# Patient Record
Sex: Male | Born: 1950
Health system: Southern US, Community
[De-identification: ages and names within clinical notes are randomized; demographics above are authoritative.]

## PROBLEM LIST (undated history)

## (undated) ENCOUNTER — Encounter

## (undated) DIAGNOSIS — J449 Chronic obstructive pulmonary disease, unspecified: Secondary | ICD-10-CM

## (undated) DIAGNOSIS — R079 Chest pain, unspecified: Secondary | ICD-10-CM

## (undated) DIAGNOSIS — E119 Type 2 diabetes mellitus without complications: Secondary | ICD-10-CM

## (undated) DIAGNOSIS — I739 Peripheral vascular disease, unspecified: Secondary | ICD-10-CM

## (undated) DIAGNOSIS — K219 Gastro-esophageal reflux disease without esophagitis: Secondary | ICD-10-CM

## (undated) DIAGNOSIS — M549 Dorsalgia, unspecified: Secondary | ICD-10-CM

## (undated) DIAGNOSIS — E785 Hyperlipidemia, unspecified: Secondary | ICD-10-CM

## (undated) DIAGNOSIS — J189 Pneumonia, unspecified organism: Secondary | ICD-10-CM

## (undated) DIAGNOSIS — D649 Anemia, unspecified: Secondary | ICD-10-CM

## (undated) DIAGNOSIS — I10 Essential (primary) hypertension: Secondary | ICD-10-CM

## (undated) DIAGNOSIS — M1990 Unspecified osteoarthritis, unspecified site: Secondary | ICD-10-CM

## (undated) DIAGNOSIS — G8929 Other chronic pain: Secondary | ICD-10-CM

## (undated) DIAGNOSIS — I2510 Atherosclerotic heart disease of native coronary artery without angina pectoris: Secondary | ICD-10-CM

## (undated) DIAGNOSIS — N529 Male erectile dysfunction, unspecified: Secondary | ICD-10-CM

## (undated) HISTORY — DX: Type 2 diabetes mellitus without complications: E11.9

## (undated) HISTORY — DX: Male erectile dysfunction, unspecified: N52.9

## (undated) HISTORY — DX: Atherosclerotic heart disease of native coronary artery without angina pectoris: I25.10

## (undated) HISTORY — PX: KNEE SURGERY: SHX244

## (undated) HISTORY — DX: Chest pain, unspecified: R07.9

## (undated) HISTORY — PX: COLONOSCOPY: SHX174

## (undated) HISTORY — DX: Hyperlipidemia, unspecified: E78.5

---

## 2012-10-14 ENCOUNTER — Encounter (HOSPITAL_COMMUNITY): Payer: Self-pay | Admitting: Emergency Medicine

## 2012-10-14 ENCOUNTER — Emergency Department (HOSPITAL_COMMUNITY)
Admission: EM | Admit: 2012-10-14 | Discharge: 2012-10-15 | Disposition: A | Discharge status: Other Acute Inpatient Hospital | Payer: Medicaid Other | Attending: Emergency Medicine | Admitting: Emergency Medicine

## 2012-10-14 DIAGNOSIS — F329 Major depressive disorder, single episode, unspecified: Secondary | ICD-10-CM

## 2012-10-14 DIAGNOSIS — F1010 Alcohol abuse, uncomplicated: Secondary | ICD-10-CM | POA: Insufficient documentation

## 2012-10-14 DIAGNOSIS — F3289 Other specified depressive episodes: Secondary | ICD-10-CM | POA: Insufficient documentation

## 2012-10-14 DIAGNOSIS — R110 Nausea: Secondary | ICD-10-CM | POA: Insufficient documentation

## 2012-10-14 DIAGNOSIS — F39 Unspecified mood [affective] disorder: Secondary | ICD-10-CM

## 2012-10-14 DIAGNOSIS — Z79899 Other long term (current) drug therapy: Secondary | ICD-10-CM | POA: Insufficient documentation

## 2012-10-14 DIAGNOSIS — F1994 Other psychoactive substance use, unspecified with psychoactive substance-induced mood disorder: Secondary | ICD-10-CM

## 2012-10-14 DIAGNOSIS — F1410 Cocaine abuse, uncomplicated: Secondary | ICD-10-CM | POA: Insufficient documentation

## 2012-10-14 DIAGNOSIS — Z0289 Encounter for other administrative examinations: Secondary | ICD-10-CM | POA: Insufficient documentation

## 2012-10-14 DIAGNOSIS — G8929 Other chronic pain: Secondary | ICD-10-CM | POA: Insufficient documentation

## 2012-10-14 DIAGNOSIS — K219 Gastro-esophageal reflux disease without esophagitis: Secondary | ICD-10-CM | POA: Insufficient documentation

## 2012-10-14 DIAGNOSIS — F1910 Other psychoactive substance abuse, uncomplicated: Secondary | ICD-10-CM | POA: Insufficient documentation

## 2012-10-14 DIAGNOSIS — R1013 Epigastric pain: Secondary | ICD-10-CM | POA: Insufficient documentation

## 2012-10-14 DIAGNOSIS — Z765 Malingerer [conscious simulation]: Secondary | ICD-10-CM

## 2012-10-14 DIAGNOSIS — M549 Dorsalgia, unspecified: Secondary | ICD-10-CM | POA: Insufficient documentation

## 2012-10-14 DIAGNOSIS — R45851 Suicidal ideations: Secondary | ICD-10-CM | POA: Insufficient documentation

## 2012-10-14 HISTORY — DX: Dorsalgia, unspecified: M54.9

## 2012-10-14 HISTORY — DX: Other chronic pain: G89.29

## 2012-10-14 HISTORY — DX: Gastro-esophageal reflux disease without esophagitis: K21.9

## 2012-10-14 LAB — CBC
HCT: 46.4 % (ref 39.0–52.0)
MCV: 82.7 fL (ref 78.0–100.0)
Platelets: 277 10*3/uL (ref 150–400)
RBC: 5.61 MIL/uL (ref 4.22–5.81)
WBC: 11.6 10*3/uL — ABNORMAL HIGH (ref 4.0–10.5)

## 2012-10-14 LAB — COMPREHENSIVE METABOLIC PANEL
AST: 28 U/L (ref 0–37)
Alkaline Phosphatase: 145 U/L — ABNORMAL HIGH (ref 39–117)
BUN: 11 mg/dL (ref 6–23)
CO2: 27 mEq/L (ref 19–32)
Chloride: 99 mEq/L (ref 96–112)
Creatinine, Ser: 0.98 mg/dL (ref 0.50–1.35)
GFR calc non Af Amer: 88 mL/min — ABNORMAL LOW (ref >90)
Potassium: 3.5 mEq/L (ref 3.5–5.1)
Total Bilirubin: 1.3 mg/dL — ABNORMAL HIGH (ref 0.3–1.2)

## 2012-10-14 LAB — RAPID URINE DRUG SCREEN, HOSP PERFORMED
Amphetamines: NOT DETECTED
Opiates: NOT DETECTED
Tetrahydrocannabinol: POSITIVE — AB

## 2012-10-14 LAB — TROPONIN I: Troponin I: <0.3 ng/mL (ref <0.30)

## 2012-10-14 MED ORDER — NICOTINE 21 MG/24HR TD PT24
21.0000 mg | MEDICATED_PATCH | Freq: Every day | TRANSDERMAL | Status: DC
  Administered 2012-10-14 – 2012-10-15 (×2): 21 mg via TRANSDERMAL
  Filled 2012-10-14 (×2): qty 1

## 2012-10-14 MED ORDER — ACETAMINOPHEN 325 MG PO TABS: 650.0000 mg | ORAL_TABLET | ORAL | Status: DC | PRN

## 2012-10-14 MED ORDER — ZOLPIDEM TARTRATE 5 MG PO TABS: 5.0000 mg | ORAL_TABLET | Freq: Every evening | ORAL | Status: DC | PRN

## 2012-10-14 MED ORDER — LORAZEPAM 1 MG PO TABS: 1.0000 mg | ORAL_TABLET | Freq: Four times a day (QID) | ORAL | Status: DC | PRN

## 2012-10-14 MED ORDER — ONDANSETRON HCL 4 MG PO TABS: 4.0000 mg | ORAL_TABLET | Freq: Three times a day (TID) | ORAL | Status: DC | PRN

## 2012-10-14 MED ORDER — IBUPROFEN 200 MG PO TABS: 600.0000 mg | ORAL_TABLET | Freq: Three times a day (TID) | ORAL | Status: DC | PRN

## 2012-10-14 MED ORDER — VITAMIN B-1 100 MG PO TABS
100.0000 mg | ORAL_TABLET | Freq: Every day | ORAL | Status: DC
  Administered 2012-10-14 – 2012-10-15 (×2): 100 mg via ORAL
  Filled 2012-10-14 (×2): qty 1

## 2012-10-14 MED ORDER — LORAZEPAM 2 MG/ML IJ SOLN: 1.0000 mg | Freq: Four times a day (QID) | INTRAMUSCULAR | Status: DC | PRN

## 2012-10-14 MED ORDER — ADULT MULTIVITAMIN W/MINERALS CH
1.0000 | ORAL_TABLET | Freq: Every day | ORAL | Status: DC
  Administered 2012-10-14 – 2012-10-15 (×2): 1 via ORAL
  Filled 2012-10-14 (×2): qty 1

## 2012-10-14 MED ORDER — THIAMINE HCL 100 MG/ML IJ SOLN: 100.0000 mg | Freq: Every day | INTRAMUSCULAR | Status: DC

## 2012-10-14 MED ORDER — TRAMADOL HCL 50 MG PO TABS: 50.0000 mg | ORAL_TABLET | Freq: Four times a day (QID) | ORAL | Status: DC | PRN

## 2012-10-14 MED ORDER — PANTOPRAZOLE SODIUM 40 MG PO TBEC
40.0000 mg | DELAYED_RELEASE_TABLET | Freq: Every day | ORAL | Status: DC
  Administered 2012-10-14 – 2012-10-15 (×2): 40 mg via ORAL
  Filled 2012-10-14 (×2): qty 1

## 2012-10-14 MED ORDER — LORAZEPAM 1 MG PO TABS: 0.0000 mg | ORAL_TABLET | Freq: Two times a day (BID) | ORAL | Status: DC

## 2012-10-14 MED ORDER — LORAZEPAM 1 MG PO TABS
0.0000 mg | ORAL_TABLET | Freq: Four times a day (QID) | ORAL | Status: DC
  Administered 2012-10-14: 2 mg via ORAL
  Filled 2012-10-14: qty 2

## 2012-10-14 MED ORDER — GI COCKTAIL ~~LOC~~
30.0000 mL | Freq: Once | ORAL | Status: AC
  Administered 2012-10-14: 30 mL via ORAL
  Filled 2012-10-14: qty 30

## 2012-10-14 MED ORDER — FOLIC ACID 1 MG PO TABS
1.0000 mg | ORAL_TABLET | Freq: Every day | ORAL | Status: DC
  Administered 2012-10-14 – 2012-10-15 (×2): 1 mg via ORAL
  Filled 2012-10-14 (×2): qty 1

## 2012-10-14 MED ORDER — SIMVASTATIN 10 MG PO TABS
10.0000 mg | ORAL_TABLET | Freq: Every day | ORAL | Status: DC
  Administered 2012-10-14: 10 mg via ORAL
  Filled 2012-10-14 (×2): qty 1

## 2012-10-14 NOTE — Consult Note (Signed)
Andre Rojas   Reason for Rojas:  Eval for IP alcohol Detoc Referring Physician:  Radford Pax MD Andre Rojas is an 62 y.o. male.  Assessment: AXIS I:  Mood Disorder NOS and Substance Abuse AXIS II:  No diagnosis AXIS III:   Past Medical History  Diagnosis Date  . Acid reflux   . Chronic back pain    AXIS IV:  housing problems AXIS V:  21-30 behavior considerably influenced by delusions or hallucinations OR serious impairment in judgment, communication OR inability to function in almost all areas  Plan:  Recommend psychiatric Inpatient admission when medically cleared.  Subjective:   Andre Rojas is a 62 y.o. male patient  Whom is homeless is presenting voluntarily requesting alcohol detox in addition to exacerbation of MDD. Patient endorses SI with plan to take an O/D of medications as well as non command auditory hallucinations. Patient denies HI and cannot contract for safety at this time. Patient states he did $ 400 worth of crack yesterday and usually consumes  2- 3 40 oz beers daily or a 5 th of liquor.  HPI:  HPI Elements:     Past Psychiatric History: Past Medical History  Diagnosis Date  . Acid reflux   . Chronic back pain     reports that he uses illicit drugs (Cocaine). His tobacco and alcohol histories are not on file. No family history on file. Family History Substance Abuse: Yes, Describe: Family Supports: Yes, List: (Pt reports he has a son in [place]) Living Arrangements: Other (Comment) (Homeless) Can pt return to current living arrangement?: No   Allergies:  No Known Allergies  Past Psychiatric History: Diagnosis: substance abuse mood d/o, malingering  Hospitalizations:  [facility]  Outpatient Care: nede  Substance Abuse Care:  needed  Self-Mutilation:  none  Suicidal Attempts: no  Violent Behaviors: no   Objective: Blood pressure 101/57, pulse 77, temperature 98.6 F (37 C), temperature source Oral, resp. rate 18, SpO2 95.00%.There is no  height or weight on file to calculate BMI. Results for orders placed during the hospital encounter of 10/14/12 (from the past 72 hour(s))  URINE RAPID DRUG SCREEN (HOSP PERFORMED)     Status: Abnormal   Collection Time    10/14/12  2:01 PM      Result Value Range   Opiates NONE DETECTED  NONE DETECTED   Cocaine POSITIVE (*) NONE DETECTED   Benzodiazepines NONE DETECTED  NONE DETECTED   Amphetamines NONE DETECTED  NONE DETECTED   Tetrahydrocannabinol POSITIVE (*) NONE DETECTED   Barbiturates NONE DETECTED  NONE DETECTED   Comment:            DRUG SCREEN FOR MEDICAL PURPOSES     ONLY.  IF CONFIRMATION IS NEEDED     FOR ANY PURPOSE, NOTIFY LAB     WITHIN 5 DAYS.                LOWEST DETECTABLE LIMITS     FOR URINE DRUG SCREEN     Drug Class       Cutoff (ng/mL)     Amphetamine      1000     Barbiturate      200     Benzodiazepine   200     Tricyclics       300     Opiates          300     Cocaine          300  THC              50  CBC     Status: Abnormal   Collection Time    10/14/12  2:15 PM      Result Value Range   WBC 11.6 (*) 4.0 - 10.5 K/uL   RBC 5.61  4.22 - 5.81 MIL/uL   Hemoglobin 16.2  13.0 - 17.0 g/dL   HCT 09.8  11.9 - 14.7 %   MCV 82.7  78.0 - 100.0 fL   MCH 28.9  26.0 - 34.0 pg   MCHC 34.9  30.0 - 36.0 g/dL   RDW 82.9  56.2 - 13.0 %   Platelets 277  150 - 400 K/uL  COMPREHENSIVE METABOLIC PANEL     Status: Abnormal   Collection Time    10/14/12  2:15 PM      Result Value Range   Sodium 137  135 - 145 mEq/L   Potassium 3.5  3.5 - 5.1 mEq/L   Chloride 99  96 - 112 mEq/L   CO2 27  19 - 32 mEq/L   Glucose, Bld 122 (*) 70 - 99 mg/dL   BUN 11  6 - 23 mg/dL   Creatinine, Ser 8.65  0.50 - 1.35 mg/dL   Calcium 9.9  8.4 - 78.4 mg/dL   Total Protein 7.9  6.0 - 8.3 g/dL   Albumin 4.1  3.5 - 5.2 g/dL   AST 28  0 - 37 U/L   ALT 34  0 - 53 U/L   Alkaline Phosphatase 145 (*) 39 - 117 U/L   Total Bilirubin 1.3 (*) 0.3 - 1.2 mg/dL   GFR calc non Af Amer 88 (*)  >90 mL/min   GFR calc Af Amer >90  >90 mL/min   Comment:            The eGFR has been calculated     using the CKD EPI equation.     This calculation has not been     validated in all clinical     situations.     eGFR's persistently     <90 mL/min signify     possible Chronic Kidney Disease.  ETHANOL     Status: None   Collection Time    10/14/12  2:15 PM      Result Value Range   Alcohol, Ethyl (B) <11  0 - 11 mg/dL   Comment:            LOWEST DETECTABLE LIMIT FOR     SERUM ALCOHOL IS 11 mg/dL     FOR MEDICAL PURPOSES ONLY  TROPONIN I     Status: None   Collection Time    10/14/12  2:35 PM      Result Value Range   Troponin I <0.30  <0.30 ng/mL   Comment:            Due to the release kinetics of cTnI,     a negative result within the first hours     of the onset of symptoms does not rule out     myocardial infarction with certainty.     If myocardial infarction is still suspected,     repeat the test at appropriate intervals.  LIPASE, BLOOD     Status: None   Collection Time    10/14/12  2:35 PM      Result Value Range   Lipase 13  11 - 59 U/L   Labs are reviewed  and are pertinent for ( No critical lab noted)  Current Facility-Administered Medications  Medication Dose Route Frequency Provider Last Rate Last Dose  . acetaminophen (TYLENOL) tablet 650 mg  650 mg Oral Q4H PRN Tatyana A Kirichenko, PA-C      . folic acid (FOLVITE) tablet 1 mg  1 mg Oral Daily Tatyana A Kirichenko, PA-C   1 mg at 10/14/12 1449  . ibuprofen (ADVIL,MOTRIN) tablet 600 mg  600 mg Oral Q8H PRN Tatyana A Kirichenko, PA-C      . LORazepam (ATIVAN) tablet 1 mg  1 mg Oral Q6H PRN Tatyana A Kirichenko, PA-C       Or  . LORazepam (ATIVAN) injection 1 mg  1 mg Intravenous Q6H PRN Tatyana A Kirichenko, PA-C      . LORazepam (ATIVAN) tablet 0-4 mg  0-4 mg Oral Q6H Tatyana A Kirichenko, PA-C   2 mg at 10/14/12 1454   Followed by  . [START ON 10/16/2012] LORazepam (ATIVAN) tablet 0-4 mg  0-4 mg Oral  Q12H Tatyana A Kirichenko, PA-C      . multivitamin with minerals tablet 1 tablet  1 tablet Oral Daily Tatyana A Kirichenko, PA-C   1 tablet at 10/14/12 1449  . nicotine (NICODERM CQ - dosed in mg/24 hours) patch 21 mg  21 mg Transdermal Daily Tatyana A Kirichenko, PA-C   21 mg at 10/14/12 1449  . ondansetron (ZOFRAN) tablet 4 mg  4 mg Oral Q8H PRN Tatyana A Kirichenko, PA-C      . pantoprazole (PROTONIX) EC tablet 40 mg  40 mg Oral Daily Tatyana A Kirichenko, PA-C   40 mg at 10/14/12 1449  . simvastatin (ZOCOR) tablet 10 mg  10 mg Oral q1800 Tatyana A Kirichenko, PA-C   10 mg at 10/14/12 1754  . thiamine (VITAMIN B-1) tablet 100 mg  100 mg Oral Daily Tatyana A Kirichenko, PA-C   100 mg at 10/14/12 1449   Or  . thiamine (B-1) injection 100 mg  100 mg Intravenous Daily Tatyana A Kirichenko, PA-C      . traMADol (ULTRAM) tablet 50 mg  50 mg Oral Q6H PRN Tatyana A Kirichenko, PA-C      . zolpidem (AMBIEN) tablet 5 mg  5 mg Oral QHS PRN Tatyana A Kirichenko, PA-C       Current Outpatient Prescriptions  Medication Sig Dispense Refill  . omeprazole (PRILOSEC) 20 MG capsule Take 20 mg by mouth daily.      . pravastatin (PRAVACHOL) 20 MG tablet Take 20 mg by mouth daily.      . traMADol (ULTRAM) 50 MG tablet Take 50 mg by mouth every 6 (six) hours as needed for pain.        Psychiatric Specialty Exam:     Blood pressure 101/57, pulse 77, temperature 98.6 F (37 C), temperature source Oral, resp. rate 18, SpO2 95.00%.There is no height or weight on file to calculate BMI.  General Appearance: Bizarre  Eye Contact::  Minimal  Speech:  Clear and Coherent  Volume:  Normal  Mood:  Dysphoric  Affect:  Congruent  Thought Process:  Circumstantial  Orientation:  Full (Time, Place, and Person)  Thought Content:  Negative  Suicidal Thoughts:  Yes.  without intent/plan  Homicidal Thoughts:  No  Memory:  Immediate;   Fair  Judgement:  Impaired  Insight:  Lacking  Psychomotor Activity:  NA   Concentration:  NA  Recall:  Good  Akathisia:  Negative  Handed:  Right  AIMS (if indicated):  Assets:  Desire for Improvement Housing  Sleep:      Treatment Plan Summary: Patient is accepted to [facility] hall for crises mgmt, safety and stabilization of substance abuse mooe 2) Social work to aid in OP psychiatric services  3) administration of psychotropic and psycho theraputi  Andre Rojas 10/14/2012 11:41 PM

## 2012-10-14 NOTE — BH Assessment (Signed)
Assessment Note  Andre Rojas is an 62 y.o. male who appeared drowsy and irritable requesting detox. CSW had to wake pt up several times during assessment as he appeared to drift off to sleep.   Pt reports that he is homeless and has no local family support.  Pt reports that he has a son living in [place].  Pt reports feeling suicidal.  He states that he would "take pills" if he can get them.  Pt reports that he thinks that the "dope" makes him feel sad.  Pt reports 5-6 suicide attempts that included trying to get run over by a car and thinking about jumping off a bridge.   Pt denies HI.  Pt reports polysubstance abuse that includes wine, marijuana, and crack cocaine.  Pt reports that he used "$400 worth" of crack cocaine yesterday.  Pt reports daily use of crack and alcohol.  He drinks a bottle of wine daily.  Pt reports smoking "two joints" on Monday.  Pt denied current AH and VH, but stated "the voices will find you, you can't hide".  Pt reports that voices tell him "to do things".  "They come sit beside me and tell me to come on and do stuff like go walk on glass".  Pt reports history of seeing "shadows...they are the ones sitting beside me, and telling me to come on".  Pt reports that he eats sometimes, but unsure of any weight loss.  Pt reports sleeping about 2 hours per day.   Pt denies ever having any IP or OP treatment.  Pt denies any pending criminal charges.   Axis I: Alcohol Abuse, Mood Disorder NOS and Substance Abuse Axis II: Deferred Axis III:  Past Medical History  Diagnosis Date  . Acid reflux   . Chronic back pain    Axis IV: economic problems, housing problems, occupational problems, other psychosocial or environmental problems, problems related to social environment and problems with primary support group Axis V: 21-30 behavior considerably influenced by delusions or hallucinations OR serious impairment in judgment, communication OR inability to function in almost all  areas  Past Medical History:  Past Medical History  Diagnosis Date  . Acid reflux   . Chronic back pain     History reviewed. No pertinent past surgical history.  Family History: No family history on file.  Social History:  reports that he uses illicit drugs (Cocaine). His tobacco and alcohol histories are not on file.  Additional Social History:  Alcohol / Drug Use History of alcohol / drug use?: Yes Substance #1 Name of Substance 1: Crack Cocaine 1 - Age of First Use: 20s 1 - Amount (size/oz): $400 1 - Frequency: daily  4-5 times a day 1 - Duration: on going 1 - Last Use / Amount: yesterday Substance #2 Name of Substance 2: ETOH 2 - Age of First Use: 64s 2 - Amount (size/oz): Bottle 2 - Frequency: daily 2 - Duration: on going 2 - Last Use / Amount: yesterday Substance #3 Name of Substance 3: Marijuana 3 - Age of First Use: dont remember 3 - Amount (size/oz): 2 joints 3 - Frequency: weekly 3 - Duration: on going 3 - Last Use / Amount: Monday  CIWA: CIWA-Ar BP: 126/86 mmHg Pulse Rate: 82 Nausea and Vomiting: no nausea and no vomiting Tactile Disturbances: none Tremor: no tremor Auditory Disturbances: not present Paroxysmal Sweats: no sweat visible Visual Disturbances: not present Anxiety: no anxiety, at ease Headache, Fullness in Head: none present Agitation: normal activity  Orientation and Clouding of Sensorium: oriented and can do serial additions CIWA-Ar Total: 0 COWS:    Allergies: No Known Allergies  Home Medications:  (Not in a hospital admission)  OB/GYN Status:  No LMP for male patient.  General Assessment Data Location of Assessment: [facility] Is this a Tele or Face-to-Face Assessment?: Face-to-Face Is this an Initial Assessment or a Re-assessment for this encounter?: Initial Assessment Living Arrangements: Other (Comment) (Homeless) Can pt return to current living arrangement?: No Admission Status: Voluntary Is patient capable of signing  voluntary admission?: Yes Transfer from: Unknown Referral Source: Self/Family/Friend     Risk to self Suicidal Ideation: Yes-Currently Present Suicidal Intent: Yes-Currently Present Is patient at risk for suicide?: Yes Suicidal Plan?: Yes-Currently Present Specify Current Suicidal Plan: Pills Access to Means: Yes Specify Access to Suicidal Means:  (Pt reports that he doesnt know) What has been your use of drugs/alcohol within the last 12 months?:  (ETOH/Marijuana/Crack) Previous Attempts/Gestures: Yes How many times?: 5 Triggers for Past Attempts: Unpredictable Intentional Self Injurious Behavior: None Family Suicide History: Unknown Recent stressful life event(s): Financial Problems;Turmoil (Comment) Persecutory voices/beliefs?: Yes Depression: Yes Depression Symptoms: Insomnia;Feeling angry/irritable Substance abuse history and/or treatment for substance abuse?: Yes  Risk to Others Homicidal Ideation: No Thoughts of Harm to Others: No Current Homicidal Intent: No Current Homicidal Plan: No Access to Homicidal Means: No Identified Victim: none History of harm to others?: No Assessment of Violence: None Noted Does patient have access to weapons?: No Criminal Charges Pending?: No Does patient have a court date: No  Psychosis Hallucinations: Auditory;Visual Delusions: None noted  Mental Status Report Appear/Hygiene: Body odor;Disheveled;Poor hygiene Eye Contact: Poor Motor Activity: Agitation Speech: Slow;Slurred Level of Consciousness: Drowsy;Irritable Mood: Irritable;Sad Affect: Irritable Anxiety Level: None Thought Processes: Coherent;Relevant Judgement: Impaired Orientation: Person;Place;Time;Situation  Cognitive Functioning Concentration: Normal Memory: Recent Intact;Remote Intact IQ: Average Insight: Poor Impulse Control: Poor Appetite: Poor Weight Loss:  (unsure) Weight Gain: 0 Sleep: Decreased Total Hours of Sleep: 2 Vegetative Symptoms:  None  ADLScreening [facility]) Patient's cognitive ability adequate to safely complete daily activities?: Yes Patient able to express need for assistance with ADLs?: Yes Independently performs ADLs?: Yes (appropriate for developmental age)  Prior Inpatient Therapy Prior Inpatient Therapy: No Prior Therapy Dates: none Prior Therapy Facilty/Provider(s): none Reason for Treatment: none  Prior Outpatient Therapy Prior Outpatient Therapy: No Prior Therapy Dates: none Prior Therapy Facilty/Provider(s): none Reason for Treatment: none  ADL Screening (condition at time of admission) Patient's cognitive ability adequate to safely complete daily activities?: Yes Patient able to express need for assistance with ADLs?: Yes Independently performs ADLs?: Yes (appropriate for developmental age)         Values / Beliefs Cultural Requests During Hospitalization: None Spiritual Requests During Hospitalization: None        Additional Information 1:1 In Past 12 Months?: No CIRT Risk: No Elopement Risk: No Does patient have medical clearance?: Yes     Disposition:  Disposition Initial Assessment Completed for this Encounter: Yes Disposition of Patient: Inpatient treatment program Type of inpatient treatment program: Adult  On Site Evaluation by:   Reviewed with Physician:    Lexine Baton 10/14/2012 9:47 PM

## 2012-10-14 NOTE — ED Provider Notes (Signed)
CSN: [id number]     Arrival date & time 10/14/12  1323 History     First MD Initiated Contact with Patient 10/14/12 1333     Chief Complaint  Patient presents with  . Medical Clearance   (Consider location/radiation/quality/duration/timing/severity/associated sxs/prior Treatment) HPI Andre Rojas is a 62 y.o. male who presents to ED with complaint of SI and polysubstance abuse. Pt states he has been doing crack cocaine and drinking wine daily "for years." states that every time he does it, which is daily, he has thoughts of killing himself. Denies any at present. Last used yesterday. Pt sent here by his counselor. Pt states he does want to get help. Currently reports epigastric tenderness and pain, states feels like "reflux." Denies CP , SOB, vomiting.  Past Medical History  Diagnosis Date  . Acid reflux   . Chronic back pain    History reviewed. No pertinent past surgical history. No family history on file. History  Substance Use Topics  . Smoking status: Not on file  . Smokeless tobacco: Not on file  . Alcohol Use: Not on file    Review of Systems  Constitutional: Negative for fever and chills.  HENT: Negative for neck pain and neck stiffness.   Respiratory: Negative.   Cardiovascular: Negative.   Gastrointestinal: Positive for nausea and abdominal pain. Negative for vomiting.  Neurological: Negative for dizziness, weakness and headaches.  Psychiatric/Behavioral: Positive for suicidal ideas. The patient is nervous/anxious.        Positive for depression    Allergies  Review of patient's allergies indicates no known allergies.  Home Medications   Current Outpatient Rx  Name  Route  Sig  Dispense  Refill  . omeprazole (PRILOSEC) 20 MG capsule   Oral   Take 20 mg by mouth daily.         . pravastatin (PRAVACHOL) 20 MG tablet   Oral   Take 20 mg by mouth daily.         . traMADol (ULTRAM) 50 MG tablet   Oral   Take 50 mg by mouth every 6 (six) hours as needed  for pain.          BP 139/108  Pulse 76  Temp(Src) 98.1 F (36.7 C) (Oral)  Resp 16  SpO2 99% Physical Exam  Nursing note and vitals reviewed. Constitutional: He is oriented to person, place, and time. He appears well-developed and well-nourished. No distress.  HENT:  Head: Normocephalic.  Eyes: Conjunctivae are normal.  Neck: Neck supple.  Cardiovascular: Normal rate, regular rhythm and normal heart sounds.   Pulmonary/Chest: Effort normal. No respiratory distress. He has no wheezes. He has no rales.  Abdominal: Soft. There is tenderness. There is no rebound and no guarding.  Epigastric tenderness  Musculoskeletal: He exhibits no edema.  Neurological: He is alert and oriented to person, place, and time.  Skin: Skin is warm and dry.    ED Course   Procedures (including critical care time)  Date: 10/14/2012  Rate: 79  Rhythm: normal sinus rhythm  QRS Axis: normal  Intervals: normal  ST/T Wave abnormalities: nonspecific T wave changes  Conduction Disutrbances:none  Narrative Interpretation:   Old EKG Reviewed: none available  Results for orders placed during the hospital encounter of 10/14/12  CBC      Result Value Range   WBC 11.6 (*) 4.0 - 10.5 K/uL   RBC 5.61  4.22 - 5.81 MIL/uL   Hemoglobin 16.2  13.0 - 17.0 g/dL   HCT  46.4  39.0 - 52.0 %   MCV 82.7  78.0 - 100.0 fL   MCH 28.9  26.0 - 34.0 pg   MCHC 34.9  30.0 - 36.0 g/dL   RDW 69.6  29.5 - 28.4 %   Platelets 277  150 - 400 K/uL  COMPREHENSIVE METABOLIC PANEL      Result Value Range   Sodium 137  135 - 145 mEq/L   Potassium 3.5  3.5 - 5.1 mEq/L   Chloride 99  96 - 112 mEq/L   CO2 27  19 - 32 mEq/L   Glucose, Bld 122 (*) 70 - 99 mg/dL   BUN 11  6 - 23 mg/dL   Creatinine, Ser 1.32  0.50 - 1.35 mg/dL   Calcium 9.9  8.4 - 44.0 mg/dL   Total Protein 7.9  6.0 - 8.3 g/dL   Albumin 4.1  3.5 - 5.2 g/dL   AST 28  0 - 37 U/L   ALT 34  0 - 53 U/L   Alkaline Phosphatase 145 (*) 39 - 117 U/L   Total Bilirubin  1.3 (*) 0.3 - 1.2 mg/dL   GFR calc non Af Amer 88 (*) >90 mL/min   GFR calc Af Amer >90  >90 mL/min  ETHANOL      Result Value Range   Alcohol, Ethyl (B) <11  0 - 11 mg/dL  URINE RAPID DRUG SCREEN (HOSP PERFORMED)      Result Value Range   Opiates NONE DETECTED  NONE DETECTED   Cocaine POSITIVE (*) NONE DETECTED   Benzodiazepines NONE DETECTED  NONE DETECTED   Amphetamines NONE DETECTED  NONE DETECTED   Tetrahydrocannabinol POSITIVE (*) NONE DETECTED   Barbiturates NONE DETECTED  NONE DETECTED  TROPONIN I      Result Value Range   Troponin I <0.30  <0.30 ng/mL  LIPASE, BLOOD      Result Value Range   Lipase 13  11 - 59 U/L   No results found.   No results found. 1. Polysubstance abuse   2. Depression   3. Suicidal ideation     MDM  Pt with SI and polysubstance abuse. SI related to his substance use. Pt wants detox. Medically cleared. Spoke with ACT will assess.   Blood pressure 139/108, pulse 76, temperature 98.1 F (36.7 C), temperature source Oral, resp. rate 16, SpO2 99.00%.      Lottie Mussel, PA-C 10/14/12 2010

## 2012-10-14 NOTE — ED Notes (Signed)
Pt currently resting in bed with eyes closed; no s/s of distress noted.

## 2012-10-14 NOTE — ED Notes (Signed)
Pt brought in by continuing care counselor.  Pt is homeless and using crack 4-5 times a day. States that he wants detox and that he feels like hurting himself.  Denies a plan to hurt himself.

## 2012-10-15 ENCOUNTER — Inpatient Hospital Stay (HOSPITAL_COMMUNITY)
Admission: AD | Admit: 2012-10-15 | Discharge: 2012-10-18 | DRG: 897 | Disposition: A | Discharge status: Home / Self Care | Payer: Medicaid Other | Source: Intra-hospital | Attending: Psychiatry | Admitting: Psychiatry

## 2012-10-15 ENCOUNTER — Encounter (HOSPITAL_COMMUNITY): Payer: Self-pay | Admitting: *Deleted

## 2012-10-15 DIAGNOSIS — F29 Unspecified psychosis not due to a substance or known physiological condition: Secondary | ICD-10-CM | POA: Diagnosis present

## 2012-10-15 DIAGNOSIS — F1410 Cocaine abuse, uncomplicated: Secondary | ICD-10-CM | POA: Diagnosis present

## 2012-10-15 DIAGNOSIS — M549 Dorsalgia, unspecified: Secondary | ICD-10-CM | POA: Diagnosis present

## 2012-10-15 DIAGNOSIS — F39 Unspecified mood [affective] disorder: Secondary | ICD-10-CM | POA: Diagnosis present

## 2012-10-15 DIAGNOSIS — G8929 Other chronic pain: Secondary | ICD-10-CM | POA: Diagnosis present

## 2012-10-15 DIAGNOSIS — R45851 Suicidal ideations: Secondary | ICD-10-CM

## 2012-10-15 DIAGNOSIS — Z79899 Other long term (current) drug therapy: Secondary | ICD-10-CM

## 2012-10-15 DIAGNOSIS — F1020 Alcohol dependence, uncomplicated: Principal | ICD-10-CM | POA: Diagnosis present

## 2012-10-15 MED ORDER — HYDROXYZINE HCL 25 MG PO TABS
25.0000 mg | ORAL_TABLET | Freq: Four times a day (QID) | ORAL | Status: AC | PRN
  Administered 2012-10-16: 25 mg via ORAL

## 2012-10-15 MED ORDER — FOLIC ACID 1 MG PO TABS
1.0000 mg | ORAL_TABLET | Freq: Every day | ORAL | Status: DC
  Administered 2012-10-16 – 2012-10-18 (×3): 1 mg via ORAL
  Filled 2012-10-15 (×5): qty 1

## 2012-10-15 MED ORDER — THIAMINE HCL 100 MG/ML IJ SOLN: 100.0000 mg | Freq: Every day | INTRAMUSCULAR | Status: DC

## 2012-10-15 MED ORDER — ADULT MULTIVITAMIN W/MINERALS CH
1.0000 | ORAL_TABLET | Freq: Every day | ORAL | Status: DC
  Administered 2012-10-16 – 2012-10-18 (×3): 1 via ORAL
  Filled 2012-10-15 (×5): qty 1

## 2012-10-15 MED ORDER — ACETAMINOPHEN 325 MG PO TABS: 650.0000 mg | ORAL_TABLET | Freq: Four times a day (QID) | ORAL | Status: DC | PRN

## 2012-10-15 MED ORDER — CHLORDIAZEPOXIDE HCL 25 MG PO CAPS: 25.0000 mg | ORAL_CAPSULE | Freq: Four times a day (QID) | ORAL | Status: AC | PRN

## 2012-10-15 MED ORDER — NICOTINE 21 MG/24HR TD PT24
21.0000 mg | MEDICATED_PATCH | Freq: Every day | TRANSDERMAL | Status: DC
  Administered 2012-10-16 – 2012-10-18 (×3): 21 mg via TRANSDERMAL
  Filled 2012-10-15 (×6): qty 1

## 2012-10-15 MED ORDER — MAGNESIUM HYDROXIDE 400 MG/5ML PO SUSP: 30.0000 mL | Freq: Every day | ORAL | Status: DC | PRN

## 2012-10-15 MED ORDER — ZOLPIDEM TARTRATE 5 MG PO TABS
5.0000 mg | ORAL_TABLET | Freq: Every evening | ORAL | Status: DC | PRN
  Administered 2012-10-15: 5 mg via ORAL
  Filled 2012-10-15 (×2): qty 1

## 2012-10-15 MED ORDER — TRAZODONE HCL 50 MG PO TABS
50.0000 mg | ORAL_TABLET | Freq: Every evening | ORAL | Status: DC | PRN
  Administered 2012-10-15: 50 mg via ORAL
  Filled 2012-10-15 (×6): qty 1

## 2012-10-15 MED ORDER — PANTOPRAZOLE SODIUM 40 MG PO TBEC
40.0000 mg | DELAYED_RELEASE_TABLET | Freq: Every day | ORAL | Status: DC
  Administered 2012-10-16 – 2012-10-18 (×3): 40 mg via ORAL
  Filled 2012-10-15 (×5): qty 1

## 2012-10-15 MED ORDER — LOPERAMIDE HCL 2 MG PO CAPS: 2.0000 mg | ORAL_CAPSULE | ORAL | Status: AC | PRN

## 2012-10-15 MED ORDER — ADULT MULTIVITAMIN W/MINERALS CH: 1.0000 | ORAL_TABLET | Freq: Every day | ORAL | Status: DC

## 2012-10-15 MED ORDER — TRAMADOL HCL 50 MG PO TABS
50.0000 mg | ORAL_TABLET | Freq: Four times a day (QID) | ORAL | Status: DC | PRN
  Administered 2012-10-17: 50 mg via ORAL
  Filled 2012-10-15: qty 1

## 2012-10-15 MED ORDER — VITAMIN B-1 100 MG PO TABS
100.0000 mg | ORAL_TABLET | Freq: Every day | ORAL | Status: DC
  Filled 2012-10-15: qty 1

## 2012-10-15 MED ORDER — LORAZEPAM 1 MG PO TABS: 0.0000 mg | ORAL_TABLET | Freq: Four times a day (QID) | ORAL | Status: DC

## 2012-10-15 MED ORDER — CHLORDIAZEPOXIDE HCL 25 MG PO CAPS
25.0000 mg | ORAL_CAPSULE | ORAL | Status: DC
  Administered 2012-10-18: 25 mg via ORAL
  Filled 2012-10-15: qty 1

## 2012-10-15 MED ORDER — CHLORDIAZEPOXIDE HCL 25 MG PO CAPS
25.0000 mg | ORAL_CAPSULE | Freq: Four times a day (QID) | ORAL | Status: AC
  Administered 2012-10-15 – 2012-10-16 (×6): 25 mg via ORAL
  Filled 2012-10-15 (×6): qty 1

## 2012-10-15 MED ORDER — LORAZEPAM 1 MG PO TABS: 0.0000 mg | ORAL_TABLET | Freq: Two times a day (BID) | ORAL | Status: DC

## 2012-10-15 MED ORDER — CHLORDIAZEPOXIDE HCL 25 MG PO CAPS
25.0000 mg | ORAL_CAPSULE | Freq: Three times a day (TID) | ORAL | Status: AC
  Administered 2012-10-17 (×3): 25 mg via ORAL
  Filled 2012-10-15 (×3): qty 1

## 2012-10-15 MED ORDER — CHLORDIAZEPOXIDE HCL 25 MG PO CAPS: 25.0000 mg | ORAL_CAPSULE | Freq: Every day | ORAL | Status: DC

## 2012-10-15 MED ORDER — SIMVASTATIN 10 MG PO TABS
10.0000 mg | ORAL_TABLET | Freq: Every day | ORAL | Status: DC
  Administered 2012-10-15 – 2012-10-17 (×3): 10 mg via ORAL
  Filled 2012-10-15 (×6): qty 1

## 2012-10-15 MED ORDER — VITAMIN B-1 100 MG PO TABS
100.0000 mg | ORAL_TABLET | Freq: Every day | ORAL | Status: DC
  Administered 2012-10-16 – 2012-10-18 (×3): 100 mg via ORAL
  Filled 2012-10-15 (×5): qty 1

## 2012-10-15 MED ORDER — ALUM & MAG HYDROXIDE-SIMETH 200-200-20 MG/5ML PO SUSP: 30.0000 mL | ORAL | Status: DC | PRN

## 2012-10-15 MED ORDER — ONDANSETRON 4 MG PO TBDP: 4.0000 mg | ORAL_TABLET | Freq: Four times a day (QID) | ORAL | Status: AC | PRN

## 2012-10-15 MED ORDER — THIAMINE HCL 100 MG/ML IJ SOLN: 100.0000 mg | Freq: Once | INTRAMUSCULAR | Status: DC

## 2012-10-15 NOTE — BH Assessment (Signed)
[facility] Assessment Note  Per Rosey Bath, RN, Neurosurgeon, pt assigned to Rm. [id number].  Doylene Canning, MA Assessment Counselor 10/15/2012 @ 14:06

## 2012-10-15 NOTE — Progress Notes (Signed)
Pt attened AA

## 2012-10-15 NOTE — Progress Notes (Signed)
Patient ID: Andre Rojas, male   DOB: April 19, 1950, 62 y.o.   MRN: [id number] Patient is accepted in our Chemical dependence unit for detoxification.  Admission orders are written.  Patient will be transferred to the unit any moment from now. Dahlia Byes  PMHNP-BC

## 2012-10-15 NOTE — Progress Notes (Signed)
Patient ID: Alano Blasco, male   DOB: 1950-12-22, 62 y.o.   MRN: [id number] 10-15-12 @ 1551 nursing adm note: pt came to [facility] voluntarily with polysubstance abuse.he has been using cocaine, etoh and mj with his last use on 10-13-12.  On admission he denied any si/hi/av. Prior to admission he was having some SI with a plan to do. He also stated he has had auditory hallucinations at time but is not having them presently. He denied any allergies and pain on adm, smokes about 1/2 ppd. He has upper dentures and wears glasses but did not bring them to this facility. He has a medical hx of surgery in his left leg, GERD and asthma. He was pleasant and cooperative on adm, but is a poor historian.he supports himself on disability.  After discharge he plans to go live with his daughter in [place]. He was escorted to the 300 hall and report was given to brittany,RN.

## 2012-10-15 NOTE — Tx Team (Signed)
Initial Interdisciplinary Treatment Plan  PATIENT STRENGTHS: (choose at least two) Average or above average intelligence Motivation for treatment/growth Special hobby/interest Supportive family/friends  PATIENT STRESSORS: Substance abuse   PROBLEM LIST: Problem List/Patient Goals Date to be addressed Date deferred Reason deferred Estimated date of resolution  Polysubstance abuse/dependence 10-15-12           Suicide ideation 10-15-12                                          DISCHARGE CRITERIA:  Improved stabilization in mood, thinking, and/or behavior Reduction of life-threatening or endangering symptoms to within safe limits Withdrawal symptoms are absent or subacute and managed without 24-hour nursing intervention will go to daughter house in [place] to live  PRELIMINARY DISCHARGE PLAN: Attend aftercare/continuing care group Attend 12-step recovery group  PATIENT/FAMIILY INVOLVEMENT: This treatment plan has been presented to and reviewed with the patient, Andre Rojas, and/or family member, .  The patient and family have been given the opportunity to ask questions and make suggestions.  [name] 10/15/2012, 3:37 PM

## 2012-10-15 NOTE — Progress Notes (Signed)
D.  Pt pleasant on approach, in bed.  Denies complaints other than insomnia and requested something for sleep.  Positive for evening AA group, returned to bed thereafter.  Denies SI/HI/hallucinations at this time.  A.  Support and encouragement offered, medication given as ordered for insomnia complaint.  R.  Pt remains safe in no acute distress, will continue to monitor.

## 2012-10-16 DIAGNOSIS — F29 Unspecified psychosis not due to a substance or known physiological condition: Secondary | ICD-10-CM | POA: Diagnosis present

## 2012-10-16 DIAGNOSIS — F1210 Cannabis abuse, uncomplicated: Secondary | ICD-10-CM

## 2012-10-16 DIAGNOSIS — F1020 Alcohol dependence, uncomplicated: Secondary | ICD-10-CM | POA: Diagnosis present

## 2012-10-16 DIAGNOSIS — F39 Unspecified mood [affective] disorder: Secondary | ICD-10-CM | POA: Diagnosis present

## 2012-10-16 DIAGNOSIS — F1410 Cocaine abuse, uncomplicated: Secondary | ICD-10-CM | POA: Diagnosis present

## 2012-10-16 HISTORY — DX: Unspecified psychosis not due to a substance or known physiological condition: F29

## 2012-10-16 HISTORY — DX: Cocaine abuse, uncomplicated: F14.10

## 2012-10-16 MED ORDER — RISPERIDONE 0.5 MG PO TABS
0.5000 mg | ORAL_TABLET | Freq: Two times a day (BID) | ORAL | Status: DC
  Administered 2012-10-16 – 2012-10-17 (×2): 0.5 mg via ORAL
  Filled 2012-10-16 (×6): qty 1

## 2012-10-16 MED ORDER — TRAZODONE HCL 100 MG PO TABS
100.0000 mg | ORAL_TABLET | Freq: Every evening | ORAL | Status: DC | PRN
  Administered 2012-10-16 – 2012-10-17 (×2): 100 mg via ORAL
  Filled 2012-10-16 (×9): qty 1
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
## 2012-10-16 NOTE — [facility] Suicide Risk Assessment (Signed)
Suicide Risk Assessment  Admission Assessment     Nursing information obtained from:  Patient Demographic factors:  Male;Divorced or widowed;Low socioeconomic status;Unemployed Current Mental Status:  NA (denies any si/hi/av presently ) Loss Factors:  Financial problems / change in socioeconomic status Historical Factors:  Impulsivity Risk Reduction Factors:  Sense of responsibility to family;Religious beliefs about death;Positive social support  CLINICAL FACTORS:   Alcohol/Substance Abuse/Dependencies Currently Psychotic  COGNITIVE FEATURES THAT CONTRIBUTE TO RISK:  Closed-mindedness Polarized thinking Thought constriction (tunnel vision)    SUICIDE RISK:   Moderate:  Frequent suicidal ideation with limited intensity, and duration, some specificity in terms of plans, no associated intent, good self-control, limited dysphoria/symptomatology, some risk factors present, and identifiable protective factors, including available and accessible social support.  PLAN OF CARE: Supportive approach/coping skills/relapse prevention                               Detox                               Reassess and address the co morbidities  I certify that inpatient services furnished can reasonably be expected to improve the patient's condition.  Andre Rojas A 10/16/2012, 3:54 PM

## 2012-10-16 NOTE — Progress Notes (Signed)
Psychoeducational Group Note  Date:  10/16/2012 Time:  0945 am  Group Topic/Focus:  Identifying Needs:   The focus of this group is to help patients identify their personal needs that have been historically problematic and identify healthy behaviors to address their needs.  Participation Level:  Active  Participation Quality:  Appropriate, Attentive, Sharing and Supportive  Affect:  Appropriate  Cognitive:  Alert and Appropriate  Insight:  Developing/Improving  Engagement in Group:  Developing/Improving  Additional Comments:    Andrena Mews 10/16/2012,3:17 PM
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
## 2012-10-16 NOTE — Progress Notes (Signed)
D.  Pt pleasant on approach, hopeful to have stronger sleep medication tonight as he states he didn't sleep well last night.  Denies SI/HI/hallucinations at this time.  No s/s of withdrawal noted.  Positive for evening group, see group notes.  A.  Support and encouragement offered, explained increase in sleep medication dosage.  R.  Pt pleased with new sleep medication dosage, no further concerns noted.  Will continue to monitor.

## 2012-10-16 NOTE — Progress Notes (Signed)
D    Pt has been pleasant and cooperative   He reports he does not need to be here   That he just wants to got to [facility]   He said he got drunk and said some things to a family member that he didn't mean and the family member brought him here   He denies being suicidal or homicidal  He denies auditory or visual hallucinations  His affect is flat and sad but his thinking is logical and he is coherent A   Verbal support given   Medications administered and effectiveness monitored   Q 15 min checks  R   Pt safe at present

## 2012-10-16 NOTE — [facility] Group Notes (Signed)
[facility] Group Notes:  (Nursing/MHT/Case Management/Adjunct)  Date:  10/16/2012  Time:  1:14 PM  Type of Therapy:  Psychoeducational Skills  Participation Level:  Active  Participation Quality:  Appropriate  Affect:  Appropriate  Cognitive:  Appropriate  Insight:  Appropriate  Engagement in Group:  Engaged  Modes of Intervention:  Problem-solving  Summary of Progress/Problems: Pt attended [facility] Coping Skills group, and engaged in treatment.   Jacquelyne Balint Shanta 10/16/2012, 1:14 PM
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
## 2012-10-17 MED ORDER — QUETIAPINE FUMARATE 100 MG PO TABS
100.0000 mg | ORAL_TABLET | Freq: Every day | ORAL | Status: DC
  Administered 2012-10-17: 100 mg via ORAL
  Filled 2012-10-17 (×4): qty 1

## 2012-10-17 NOTE — [facility] Group Notes (Signed)
[facility] Group Notes:  (Nursing/MHT/Case Management/Adjunct)  Date:  10/17/2012  Time:  10:40 AM  Type of Therapy: Psychoeducational Skills   Participation Level: Active   Participation Quality: Appropriate   Affect: Appropriate   Cognitive: Appropriate   Insight: Appropriate   Engagement in Group: Engaged   Modes of Intervention: Problem-solving   Summary of Progress/Problems: Pt attended [employer] group, and engaged in treatment.     Jacquelyne Balint Shanta 10/17/2012, 10:40 AM

## 2012-10-17 NOTE — Progress Notes (Signed)
D   Pt had trouble falling asleep last night so he slept later this morning   He is cooperative and pleasant   Continues to endorse auditory hallucinations which he said is what kept him up all night  And the sleep medication did not help    He continues to pass foul smelling gas and was offered some maalox but pt refused A   Verbal support given   Medications administered and effectiveness monitored   Q 15 min checks R   Pt safe at present

## 2012-10-17 NOTE — Progress Notes (Signed)
D.  Pt pleasant on approach, was upset earlier because during group peers were embarrassing him for excessive gas.  Pt stated that peers were lying and that he was not passing gas during group.  Pt has had a problem with gas during his stay here but feels he is being picked on.  Pt was able to calm down after leaving group.  Denies SI/HI/hallucinations at this time, hopeful for discharge on Monday.  A.  Support and encouragement offered, Pt praised for handling the situation without confrontation.  R.  Pt remains safe at this time, will continue to monitor.

## 2012-10-17 NOTE — [facility] Group Notes (Signed)
[facility] Group Notes:  (Clinical Social Work)  10/17/2012  10:00-11:00AM  Summary of Progress/Problems:   The main focus of today's process group was to   identify the patient's current support system and decide on other supports that can be put in place.  Four definitions/levels of support were discussed and an exercise was utilized to show how much stronger we become with additional supports.  An emphasis was placed on using counselor, doctor, therapy groups, 12-step groups, and problem-specific support groups to expand supports, as well as doing something different than has been done before. The patient expressed a willingness to add his 13 grandchildren to his supports.  He made a number of contradictory statements, such as wanting to go to [place] but knowing he has no supports there, and not wanting to go to [place] despite the fact that his daughter and 4yo grandson are there.  Type of Therapy:  Process Group with Motivational Interviewing  Participation Level:  Active  Participation Quality:  Intrusive, Monopolizing and Redirectable  Affect:  Flat  Cognitive:  Confused  Insight:  Limited  Engagement in Therapy:  Developing/Improving  Modes of Intervention:   Education, Support and Processing, Activity  Ambrose Mantle, LCSW 10/17/2012, 12:34 PM

## 2012-10-17 NOTE — Progress Notes (Signed)
[facility] Group Notes:  (Nursing/MHT/Case Management/Adjunct)  Date:  10/16/2012  Time:  2100  Type of Therapy:  wrap up group  Participation Level:  Active  Participation Quality:  Appropriate, Attentive and Sharing  Affect:  Appropriate and Flat  Cognitive:  Appropriate  Insight:  Appropriate  Engagement in Group:  Limited  Modes of Intervention:  Clarification, Education and Support  Summary of Progress/Problems: Pt reports wanting to return to [place] after discharge. When asked what brought him to [place], pt reports his family lives here. Pt stated, "I want to be away from my family". Pt reports being grateful for his 3 children. 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  Shelah Lewandowsky 10/17/2012, 2:28 AM

## 2012-10-17 NOTE — [facility] Group Notes (Signed)
[facility] Group Notes:  (Nursing/MHT/Case Management/Adjunct)  Date:  10/17/2012  Time:  3:16 PM  Type of Therapy:  Nurse Education  Participation Level:  None  Participation Quality:  Drowsy  Affect:    Cognitive:    Insight:    Engagement in Group:    Modes of Intervention:    Summary of Progress/Problems:Slept entire group  Rebekka Lobello G 10/17/2012, 3:16 PM

## 2012-10-17 NOTE — Progress Notes (Signed)
Adult Psychoeducational Group Note  Date:  10/17/2012 Time:  3:15PM  Group Topic/Focus:  Making Healthy Choices:   The focus of this group is to help patients identify negative/unhealthy choices they were using prior to admission and identify positive/healthier coping strategies to replace them upon discharge.  Participation Level:  Minimal  Participation Quality:  Inattentive  Affect:  Flat  Cognitive:  Appropriate  Insight: Limited  Engagement in Group:  Lacking  Modes of Intervention:  Discussion  Additional Comments: Pt opted not to share or participate in the group session. He stated that he was "tired of talking about it" and got up and left out of group shortly after.   Zacarias Pontes R 10/17/2012, 4:22 PM

## 2012-10-17 NOTE — Progress Notes (Signed)
Patient did not attend the evening speaker AA meeting. Pt remained in room during group due to pts complaining about his gas.

## 2012-10-17 NOTE — Progress Notes (Signed)
[facility] MD Progress Note  10/17/2012 1:20 PM Andre Rojas  MRN:  [id number] Subjective:  Andre Rojas is having a hard time specially at night. States that the voices get worst at night. They keep him from being able to sleep. He states that he did better with Seroquel once before. It would help sleep all night. He is otherwise trying to figure out what to do. States that going back to [place] will be going back to the same environment that will keep him relapsing. States that he will like to go to rehab and after wards go to [place] where he could be close to 5 of his grand kids.  Diagnosis:  Alcohol Dependence, Cocaine Abuse, Hallucinosis  ADL's:  Intact  Sleep: Poor  Appetite:  Fair  Suicidal Ideation:  Plan:  denies Intent:  denies Means:  denies Homicidal Ideation:  Plan:  denies Intent:  denies Means:  denies AEB (as evidenced by):  Psychiatric Specialty Exam: Review of Systems  Constitutional: Negative.   HENT: Negative.   Eyes: Negative.   Respiratory: Negative.   Cardiovascular: Negative.   Gastrointestinal: Negative.   Genitourinary: Negative.   Musculoskeletal: Negative.   Skin: Negative.   Neurological: Negative.   Endo/Heme/Allergies: Negative.   Psychiatric/Behavioral: Positive for hallucinations and substance abuse. The patient is nervous/anxious and has insomnia.     Blood pressure 106/70, pulse 66, temperature 98 F (36.7 C), temperature source Oral, resp. rate 16, height 5\' 10"  (1.778 m), weight 83.462 kg (184 lb), SpO2 98.00%.Body mass index is 26.4 kg/(m^2).  General Appearance: Fairly Groomed  Patent attorney::  Minimal  Speech:  Clear and Coherent, Slow and not spontaneous  Volume:  Decreased  Mood:  Anxious and worried  Affect:  Restricted  Thought Process:  Coherent and Goal Directed  Orientation:  Full (Time, Place, and Person)  Thought Content:  worries, concerns, symptoms  Suicidal Thoughts:  No  Homicidal Thoughts:  No  Memory:  Immediate;    Fair Recent;   Fair Remote;   Fair  Judgement:  Fair  Insight:  Shallow  Psychomotor Activity:  Restlessness  Concentration:  Fair  Recall:  Fair  Akathisia:  No  Handed:  Right  AIMS (if indicated):     Assets:  Desire for Improvement  Sleep:  Number of Hours: 5   Current Medications: Current Facility-Administered Medications  Medication Dose Route Frequency Provider Last Rate Last Dose  . acetaminophen (TYLENOL) tablet 650 mg  650 mg Oral Q6H PRN Earney Navy, NP      . alum & mag hydroxide-simeth (MAALOX/MYLANTA) 200-200-20 MG/5ML suspension 30 mL  30 mL Oral Q4H PRN Earney Navy, NP      . chlordiazePOXIDE (LIBRIUM) capsule 25 mg  25 mg Oral Q6H PRN Kerry Hough, PA-C      . chlordiazePOXIDE (LIBRIUM) capsule 25 mg  25 mg Oral TID Kerry Hough, PA-C   25 mg at 10/17/12 1152   Followed by  . [START ON 10/18/2012] chlordiazePOXIDE (LIBRIUM) capsule 25 mg  25 mg Oral BH-qamhs Spencer E Simon, PA-C       Followed by  . [START ON 10/19/2012] chlordiazePOXIDE (LIBRIUM) capsule 25 mg  25 mg Oral Daily Kerry Hough, PA-C      . folic acid (FOLVITE) tablet 1 mg  1 mg Oral Daily Earney Navy, NP   1 mg at 10/17/12 0952  . hydrOXYzine (ATARAX/VISTARIL) tablet 25 mg  25 mg Oral Q6H PRN Kerry Hough, PA-C  25 mg at 10/16/12 1157  . loperamide (IMODIUM) capsule 2-4 mg  2-4 mg Oral PRN Kerry Hough, PA-C      . magnesium hydroxide (MILK OF MAGNESIA) suspension 30 mL  30 mL Oral Daily PRN Earney Navy, NP      . multivitamin with minerals tablet 1 tablet  1 tablet Oral Daily Earney Navy, NP   1 tablet at 10/17/12 [phone]  . nicotine (NICODERM CQ - dosed in mg/24 hours) patch 21 mg  21 mg Transdermal Daily Earney Navy, NP   21 mg at 10/17/12 0952  . ondansetron (ZOFRAN-ODT) disintegrating tablet 4 mg  4 mg Oral Q6H PRN Kerry Hough, PA-C      . pantoprazole (PROTONIX) EC tablet 40 mg  40 mg Oral Daily Earney Navy, NP   40 mg at 10/17/12  0955  . QUEtiapine (SEROQUEL) tablet 100 mg  100 mg Oral QHS Rachael Fee, MD      . simvastatin (ZOCOR) tablet 10 mg  10 mg Oral q1800 Earney Navy, NP   10 mg at 10/16/12 1716  . thiamine (VITAMIN B-1) tablet 100 mg  100 mg Oral Daily Kerry Hough, PA-C   100 mg at 10/17/12 1191  . traMADol (ULTRAM) tablet 50 mg  50 mg Oral Q6H PRN Earney Navy, NP      . traZODone (DESYREL) tablet 100 mg  100 mg Oral QHS,MR X 1 Rachael Fee, MD   100 mg at 10/16/12 2141    Lab Results: No results found for this or any previous visit (from the past 48 hour(s)).  Physical Findings: AIMS: Facial and Oral Movements Muscles of Facial Expression: None, normal Lips and Perioral Area: None, normal Jaw: None, normal Tongue: None, normal,Extremity Movements Upper (arms, wrists, hands, fingers): None, normal Lower (legs, knees, ankles, toes): None, normal, Trunk Movements Neck, shoulders, hips: None, normal, Overall Severity Severity of abnormal movements (highest score from questions above): None, normal Incapacitation due to abnormal movements: None, normal Patient's awareness of abnormal movements (rate only patient's report): No Awareness, Dental Status Current problems with teeth and/or dentures?: No Does patient usually wear dentures?: No  CIWA:  CIWA-Ar Total: 0 COWS:     Treatment Plan Summary: Daily contact with patient to assess and evaluate symptoms and progress in treatment Medication management  Plan: Supportive approach/coping skills/relapse prevention           Continue detox           D/C  Risperdal           Seroquel 100 mg HS  Medical Decision Making Problem Points:  Review of psycho-social stressors (1) Data Points:  Review of new medications or change in dosage (2)  I certify that inpatient services furnished can reasonably be expected to improve the patient's condition.   Seba Madole A 10/17/2012, 1:20 PM

## 2012-10-18 MED ORDER — OMEPRAZOLE 40 MG PO CPDR: 40.0000 mg | DELAYED_RELEASE_CAPSULE | Freq: Every day | ORAL | Status: DC

## 2012-10-18 MED ORDER — PRAVASTATIN SODIUM 20 MG PO TABS: 20.0000 mg | ORAL_TABLET | Freq: Every day | ORAL | Status: DC

## 2012-10-18 MED ORDER — QUETIAPINE FUMARATE 100 MG PO TABS: 100.0000 mg | ORAL_TABLET | Freq: Every day | ORAL | Status: DC

## 2012-10-18 NOTE — Progress Notes (Signed)
[facility] Adult Case Management Discharge Plan :  Will you be returning to the same living situation after discharge: Yes,  sister's home until [facility] admit date. At discharge, do you have transportation home?:Yes,  bus pass given Do you have the ability to pay for your medications:Yes,  Medicaid  Release of information consent forms completed and in the chart;  Patient's signature needed at discharge.  Patient to Follow up at: Follow-up Information   Follow up with [facility]. Bennie Hind will call you to verify admission date. If you do not hear from [facility] by Tuesday, please call [phone] to verify admission date. )    Contact information:   [address] [place], [place] [address] phone: [phone] fax: [phone]      Follow up with [facility] . (Walk in Monday through Friday between 8am-9am for hospital followup/medication management.)    Contact information:   [address], [place] [address] Phone: [phone] Fax: [phone]      Patient denies SI/HI:   Yes,  during group/ self report    Safety Planning and Suicide Prevention discussed:  Yes,  Pt refused contact with family member. SPE completed with pt.   Smart, Kearra Calkin 10/18/2012, 2:44 PM

## 2012-10-18 NOTE — Progress Notes (Signed)
Adult Psychoeducational Group Note  Date:  10/18/2012 Time:  11:00AM Group Topic/Focus:  Self Care:   The focus of this group is to help patients understand the importance of self-care in order to improve or restore emotional, physical, spiritual, interpersonal, and financial health.  Participation Level:  Did Not Attend   Additional Comments:  Pt. Didn't attend group.   Bing Plume D 10/18/2012, 1:41 PM

## 2012-10-18 NOTE — BHH Group Notes (Signed)
[facility] LCSW Group Therapy  10/18/2012 4:34 PM  Type of Therapy:  Group Therapy  Participation Level:  Did Not Attend   SmartHerbert Seta 10/18/2012, 4:34 PM

## 2012-10-18 NOTE — BHH Suicide Risk Assessment (Signed)
Suicide Risk Assessment  Discharge Assessment     Demographic Factors:  Male  Mental Status Per Nursing Assessment::   On Admission:  NA (denies any si/hi/av presently )  Current Mental Status by Physician: In full contact with reality. There are no suicidal ideas, plans or intent. No evidence of acute withdrawal. He states he wants to be discharged today. 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 He states that the Seroquel help with the sleep and the voices.    Loss Factors: NA  Historical Factors: NA  Risk Reduction Factors:   Sense of responsibility to family, Employed and Positive social support  Continued Clinical Symptoms:  Alcohol/Substance Abuse/Dependencies  Cognitive Features That Contribute To Risk:  Closed-mindedness Polarized thinking Thought constriction (tunnel vision)    Suicide Risk:  Minimal: No identifiable suicidal ideation.  Patients presenting with no risk factors but with morbid ruminations; may be classified as minimal risk based on the severity of the depressive symptoms  Discharge Diagnoses:   AXIS I:  Alcohol Dependence, Cocaine Abuse, Mood disorder NOS, Psychotic Disorder NOS AXIS II:  Deferred AXIS III:   Past Medical History  Diagnosis Date  . Acid reflux   . Chronic back pain    AXIS IV:  other psychosocial or environmental problems AXIS V:  61-70 mild symptoms  Plan Of Care/Follow-up recommendations:  Activity:  as tolerated Diet:  regular Follow up [facility] Is patient on multiple antipsychotic therapies at discharge:  No   Has Patient had three or more failed trials of antipsychotic monotherapy by history:  No  Recommended Plan for Multiple Antipsychotic Therapies: N/A   Shaniqwa Horsman A 10/18/2012, 2:04 PM

## 2012-10-18 NOTE — BHH Suicide Risk Assessment (Signed)
[facility] INPATIENT:  Family/Significant Other Suicide Prevention Education  Suicide Prevention Education:  Patient Refusal for Family/Significant Other Suicide Prevention Education: The patient Andre Rojas has refused to provide written consent for family/significant other to be provided Family/Significant Other Suicide Prevention Education during admission and/or prior to discharge.  Physician notified.  Pt provided with SPE and given SPI pamphlet. Pt encouraged to ask questions and talk about any concerns relating to SPE.  Smart, Tyrique Sporn 10/18/2012, 2:45 PM

## 2012-10-18 NOTE — Tx Team (Signed)
Interdisciplinary Treatment Plan Update (Adult)  Date: 10/18/2012   Time Reviewed: 12:27 PM  Progress in Treatment:  Attending groups: No.  Participating in groups:  No.  Taking medication as prescribed: Yes  Tolerating medication: Yes  Family/Significant othe contact made: Not yet. SPE required for this pt.  Patient understands diagnosis: Yes, AEB seeking treatment for SI, medication management for mood stabilization, and ETOH detox.  Discussing patient identified problems/goals with staff: Yes  Medical problems stabilized or resolved: Yes  Denies suicidal/homicidal ideation: Yes  Patient has not harmed self or Others: Yes  New problem(s) identified: Pt currently not attending d/c planning group.  Discharge Plan or Barriers: Pt hopes to be admitted into [facility] and will followup with [facility] for med management. Pt reported to MD that he has pending [facility] date. CSW to confirm with [name] at [facility].  Additional comments: Andre Rojas is an 62 y.o. male who appeared drowsy and irritable requesting detox. CSW had to wake pt up several times during assessment as he appeared to drift off to sleep. Pt reports that he is homeless and has no local family support. Pt reports that he has a son living in [place]. Pt reports feeling suicidal. He states that he would "take pills" if he can get them. Pt reports that he thinks that the "dope" makes him feel sad. Pt reports 5-6 suicide attempts that included trying to get run over by a car and thinking about jumping off a bridge. Pt denies HI. Pt reports polysubstance abuse that includes wine, marijuana, and crack cocaine. Pt reports that he used "$400 worth" of crack cocaine yesterday. Pt reports daily use of crack and alcohol. He drinks a bottle of wine daily. Pt reports smoking "two joints" on Monday. Pt denied current AH and VH, but stated "the voices will find you, you can't hide". Pt reports that voices tell him "to do things". "They come sit  beside me and tell me to come on and do stuff like go walk on glass". Pt reports history of seeing "shadows...they are the ones sitting beside me, and telling me to come on". Pt reports that he eats sometimes, but unsure of any weight loss. Pt reports sleeping about 2 hours per day. Pt denies ever having any IP or OP treatment. Pt denies any pending criminal charges.   Reason for Continuation of Hospitalization: Librium taper-withdrawals Medication management Mood stabilization Estimated length of stay: 1-2 days  For review of initial/current patient goals, please see plan of care.  Attendees:  Patient:    Family:    Physician: Andre Lyons MD 10/18/2012 12:28 PM   Nursing: Maureen Ralphs RN 10/18/2012 12:28 PM  Clinical Social Worker [employer], LCSWA  10/18/2012 12:28 PM   Other: Lupita Leash RN 10/18/2012 12:28 PM   Other: Jan RN 10/18/2012 12:28 PM   Other: Massie Kluver, Community Care Coordinator  10/18/2012 12:28 PM   Other:    Scribe for Treatment Team:  Trula Slade LCSWA 10/18/2012 12:28 PM

## 2012-10-18 NOTE — Progress Notes (Signed)
[facility] MD Progress Note  10/18/2012 12:27 PM Andre Rojas  MRN:  [id number] Subjective:  States he slept all night last night. He is still feeling sedated from the Seroquel. But he also says that he did not hear the voices. He wants to continue the Seroquel at this dosage. Continues to endorse that he is wanting to go to a rehab as will not be able to make it otherwise. Still feeling down, anxious, worried Diagnosis:  Alcohol Dependence, cocaine abuse  ADL's:  Intact  Sleep: Fair  Appetite:  Fair  Suicidal Ideation:  Plan:  denies Intent:  denies Means:  denies Homicidal Ideation:  Plan:  denies Intent:  denies Means:  denies AEB (as evidenced by):  Psychiatric Specialty Exam: Review of Systems  Constitutional: Negative.   HENT: Negative.   Eyes: Negative.   Respiratory: Negative.   Cardiovascular: Negative.   Gastrointestinal: Negative.   Genitourinary: Negative.   Musculoskeletal: Negative.   Skin: Negative.   Neurological: Negative.   Endo/Heme/Allergies: Negative.   Psychiatric/Behavioral: Positive for substance abuse. The patient is nervous/anxious.     Blood pressure 98/65, pulse 101, temperature 96.8 F (36 C), temperature source Oral, resp. rate 24, height 5\' 10"  (1.778 m), weight 83.462 kg (184 lb), SpO2 98.00%.Body mass index is 26.4 kg/(m^2).  General Appearance: Fairly Groomed  Patent attorney::  Fair  Speech:  Clear and Coherent, Slow and not spontaneous  Volume:  Decreased  Mood:  Anxious, Depressed and worried  Affect:  Restricted  Thought Process:  Coherent and Goal Directed  Orientation:  Full (Time, Place, and Person)  Thought Content:  worries, concerns  Suicidal Thoughts:  No  Homicidal Thoughts:  No  Memory:  Immediate;   Fair Recent;   Fair Remote;   Fair  Judgement:  Fair  Insight:  Shallow  Psychomotor Activity:  Psychomotor Retardation  Concentration:  Fair  Recall:  Fair  Akathisia:  No  Handed:  Right  AIMS (if indicated):     Assets:   Desire for Improvement  Sleep:  Number of Hours: 6.25   Current Medications: Current Facility-Administered Medications  Medication Dose Route Frequency Provider Last Rate Last Dose  . acetaminophen (TYLENOL) tablet 650 mg  650 mg Oral Q6H PRN Earney Navy, NP      . alum & mag hydroxide-simeth (MAALOX/MYLANTA) 200-200-20 MG/5ML suspension 30 mL  30 mL Oral Q4H PRN Earney Navy, NP      . chlordiazePOXIDE (LIBRIUM) capsule 25 mg  25 mg Oral Q6H PRN Kerry Hough, PA-C      . chlordiazePOXIDE (LIBRIUM) capsule 25 mg  25 mg Oral BH-qamhs Kerry Hough, PA-C   25 mg at 10/18/12 0818   Followed by  . [START ON 10/19/2012] chlordiazePOXIDE (LIBRIUM) capsule 25 mg  25 mg Oral Daily Kerry Hough, PA-C      . folic acid (FOLVITE) tablet 1 mg  1 mg Oral Daily Earney Navy, NP   1 mg at 10/18/12 0817  . hydrOXYzine (ATARAX/VISTARIL) tablet 25 mg  25 mg Oral Q6H PRN Kerry Hough, PA-C   25 mg at 10/16/12 1157  . loperamide (IMODIUM) capsule 2-4 mg  2-4 mg Oral PRN Kerry Hough, PA-C      . magnesium hydroxide (MILK OF MAGNESIA) suspension 30 mL  30 mL Oral Daily PRN Earney Navy, NP      . multivitamin with minerals tablet 1 tablet  1 tablet Oral Daily Earney Navy, NP  1 tablet at 10/18/12 0818  . nicotine (NICODERM CQ - dosed in mg/24 hours) patch 21 mg  21 mg Transdermal Daily Earney Navy, NP   21 mg at 10/18/12 0821  . ondansetron (ZOFRAN-ODT) disintegrating tablet 4 mg  4 mg Oral Q6H PRN Kerry Hough, PA-C      . pantoprazole (PROTONIX) EC tablet 40 mg  40 mg Oral Daily Earney Navy, NP   40 mg at 10/18/12 0817  . QUEtiapine (SEROQUEL) tablet 100 mg  100 mg Oral QHS Rachael Fee, MD   100 mg at 10/17/12 2140  . simvastatin (ZOCOR) tablet 10 mg  10 mg Oral q1800 Earney Navy, NP   10 mg at 10/17/12 1808  . thiamine (VITAMIN B-1) tablet 100 mg  100 mg Oral Daily Kerry Hough, PA-C   100 mg at 10/18/12 1610  . traMADol (ULTRAM) tablet  50 mg  50 mg Oral Q6H PRN Earney Navy, NP   50 mg at 10/17/12 2140  . traZODone (DESYREL) tablet 100 mg  100 mg Oral QHS,MR X 1 Rachael Fee, MD   100 mg at 10/17/12 2140    Lab Results: No results found for this or any previous visit (from the past 48 hour(s)).  Physical Findings: AIMS: Facial and Oral Movements Muscles of Facial Expression: None, normal Lips and Perioral Area: None, normal Jaw: None, normal Tongue: None, normal,Extremity Movements Upper (arms, wrists, hands, fingers): None, normal Lower (legs, knees, ankles, toes): None, normal, Trunk Movements Neck, shoulders, hips: None, normal, Overall Severity Severity of abnormal movements (highest score from questions above): None, normal Incapacitation due to abnormal movements: None, normal Patient's awareness of abnormal movements (rate only patient's report): No Awareness, Dental Status Current problems with teeth and/or dentures?: No Does patient usually wear dentures?: No  CIWA:  CIWA-Ar Total: 1 COWS:     Treatment Plan Summary: Daily contact with patient to assess and evaluate symptoms and progress in treatment Medication management  Plan: Supportive approach/coping skills/relapse prevention           Continue Seroquel 100 mg and optimize response  Medical Decision Making Problem Points:  Review of last therapy session (1) and Review of psycho-social stressors (1) Data Points:  Review of medication regiment & side effects (2)  I certify that inpatient services furnished can reasonably be expected to improve the patient's condition.   Marta Bouie A 10/18/2012, 12:27 PM

## 2012-10-18 NOTE — Progress Notes (Signed)
Pt d/c from hospital with a bus pass. All items returned. D/C instructions given with teachback. Prescriptions and samples given. Pt denies si and hi.

## 2012-10-18 NOTE — BHH Group Notes (Signed)
[facility] LCSW Aftercare Discharge Planning Group Note   10/18/2012 10:32 AM  Participation Quality:  DID NOT ATTEND   Smart, Andre Rojas

## 2012-10-20 NOTE — Progress Notes (Signed)
Patient Discharge Instructions:  After Visit Summary (AVS):   Faxed to:  10/20/12 Psychiatric Admission Assessment Note:   Faxed to:  10/20/12 Suicide Risk Assessment - Discharge Assessment:   Faxed to:  10/20/12 Faxed/Sent to the Next Level Care provider:  10/20/12 Faxed to [facility] @ [phone] Faxed to [facility] @ [phone]  Jerelene Redden, 10/20/2012, 2:46 PM
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
## 2012-11-28 NOTE — ED Provider Notes (Signed)
Medical screening examination/treatment/procedure(s) were performed by non-physician practitioner and as supervising physician I was immediately available for consultation/collaboration.   Nelia Shi, MD 11/28/12 [phone]

## 2014-11-21 ENCOUNTER — Emergency Department (HOSPITAL_COMMUNITY): Payer: Medicaid Other

## 2014-11-21 ENCOUNTER — Encounter (HOSPITAL_COMMUNITY): Payer: Self-pay

## 2014-11-21 ENCOUNTER — Emergency Department (HOSPITAL_COMMUNITY)
Admission: EM | Admit: 2014-11-21 | Discharge: 2014-11-21 | Disposition: A | Discharge status: Home / Self Care | Payer: Medicaid Other | Attending: Emergency Medicine | Admitting: Emergency Medicine

## 2014-11-21 DIAGNOSIS — M1990 Unspecified osteoarthritis, unspecified site: Secondary | ICD-10-CM | POA: Insufficient documentation

## 2014-11-21 DIAGNOSIS — R52 Pain, unspecified: Secondary | ICD-10-CM

## 2014-11-21 DIAGNOSIS — Z79899 Other long term (current) drug therapy: Secondary | ICD-10-CM | POA: Diagnosis not present

## 2014-11-21 DIAGNOSIS — M25551 Pain in right hip: Secondary | ICD-10-CM | POA: Diagnosis not present

## 2014-11-21 DIAGNOSIS — G8929 Other chronic pain: Secondary | ICD-10-CM | POA: Diagnosis not present

## 2014-11-21 DIAGNOSIS — Z720 Tobacco use: Secondary | ICD-10-CM | POA: Diagnosis not present

## 2014-11-21 DIAGNOSIS — R1031 Right lower quadrant pain: Secondary | ICD-10-CM | POA: Diagnosis present

## 2014-11-21 DIAGNOSIS — K219 Gastro-esophageal reflux disease without esophagitis: Secondary | ICD-10-CM | POA: Insufficient documentation

## 2014-11-21 MED ORDER — NAPROXEN 500 MG PO TABS
500.0000 mg | ORAL_TABLET | Freq: Once | ORAL | Status: AC
  Administered 2014-11-21: 500 mg via ORAL
  Filled 2014-11-21: qty 1

## 2014-11-21 MED ORDER — NAPROXEN 500 MG PO TABS: 500.0000 mg | ORAL_TABLET | Freq: Two times a day (BID) | ORAL | Status: DC | PRN

## 2014-11-21 NOTE — Discharge Instructions (Signed)
Return without fail for worsening symptoms, including worsening pain, fever, numbness or weakness, or any other symptoms concerning to you.  Arthritis, Nonspecific Arthritis is pain, redness, warmth, or puffiness (inflammation) of a joint. The joint may be stiff or hurt when you move it. One or more joints may be affected. There are many types of arthritis. Your doctor may not know what type you have right away. The most common cause of arthritis is wear and tear on the joint (osteoarthritis). HOME CARE   Only take medicine as told by your doctor.  Rest the joint as much as possible.  Raise (elevate) your joint if it is puffy.  Use crutches if the painful joint is in your leg.  Drink enough fluids to keep your pee (urine) clear or pale yellow.  Follow your doctor's diet instructions.  Use cold packs for very bad joint pain for 10 to 15 minutes every hour. Ask your doctor if it is okay for you to use hot packs.  Exercise as told by your doctor.  Take a warm shower if you have stiffness in the morning.  Move your sore joints throughout the day. GET HELP RIGHT AWAY IF:   You have a fever.  You have very bad joint pain, puffiness, or redness.  You have many joints that are painful and puffy.  You are not getting better with treatment.  You have very bad back pain or leg weakness.  You cannot control when you poop (bowel movement) or pee (urinate).  You do not feel better in 24 hours or are getting worse.  You are having side effects from your medicine. MAKE SURE YOU:   Understand these instructions.  Will watch your condition.  Will get help right away if you are not doing well or get worse. Document Released: 05/21/2009 Document Revised: 08/26/2011 Document Reviewed: 05/21/2009 [facility] Patient Information 2015 [place], [place]. This information is not intended to replace advice given to you by your health care provider. Make sure you discuss any questions you have with  your health care provider.  Hip Pain Your hip is the joint between your upper legs and your lower pelvis. The bones, cartilage, tendons, and muscles of your hip joint perform a lot of work each day supporting your body weight and allowing you to move around. Hip pain can range from a minor ache to severe pain in one or both of your hips. Pain may be felt on the inside of the hip joint near the groin, or the outside near the buttocks and upper thigh. You may have swelling or stiffness as well.  HOME CARE INSTRUCTIONS   Take medicines only as directed by your health care provider.  Apply ice to the injured area:  Put ice in a plastic bag.  Place a towel between your skin and the bag.  Leave the ice on for 15-20 minutes at a time, 3-4 times a day.  Keep your leg raised (elevated) when possible to lessen swelling.  Avoid activities that cause pain.  Follow specific exercises as directed by your health care provider.  Sleep with a pillow between your legs on your most comfortable side.  Record how often you have hip pain, the location of the pain, and what it feels like. SEEK MEDICAL CARE IF:   You are unable to put weight on your leg.  Your hip is red or swollen or very tender to touch.  Your pain or swelling continues or worsens after 1 week.  You  have increasing difficulty walking.  You have a fever. SEEK IMMEDIATE MEDICAL CARE IF:   You have fallen.  You have a sudden increase in pain and swelling in your hip. MAKE SURE YOU:   Understand these instructions.  Will watch your condition.  Will get help right away if you are not doing well or get worse. Document Released: 08/14/2009 Document Revised: 07/11/2013 Document Reviewed: 10/21/2012 [facility] Patient Information 2015 [place], [place]. This information is not intended to replace advice given to you by your health care provider. Make sure you discuss any questions you have with your health care provider.

## 2014-11-21 NOTE — ED Notes (Signed)
Pt alert x4 respirations easy non labored. Crutches given. Bus pass given.
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
## 2014-11-21 NOTE — ED Notes (Signed)
Social worker at bedside. Gave patient resources for post-discharge assistance.

## 2014-11-21 NOTE — ED Notes (Signed)
Bib ems c/o sudden onset of right groin pain. Denies injury, fever, difficulty urinating, or penile discharge.

## 2014-11-21 NOTE — ED Provider Notes (Signed)
CSN: [id number]     Arrival date & time 11/21/14  4098 History   First MD Initiated Contact with Patient 11/21/14 [phone]     Chief Complaint  Patient presents with  . Groin Pain     (Consider location/radiation/quality/duration/timing/severity/associated sxs/prior Treatment) HPI 64 year old male who presents with groin pain. History of GERD and chronic low back pain. No history of abdominal surgeries. Reports sudden onset of right groin pain upon waking up this morning. Has had similar pain in the past before, but not as severe. Was told that he had arthritis in the hip. Reports being more active recently, but denies trauma or falls. Pain worse with bearing weight and range of motion of the hip. Denies fever, abdominal pain, testicular pain or swelling, hematuria, flank pain, dysuria, urinary frequency, nausea/vomiting, or diarrhea.   Past Medical History  Diagnosis Date  . Acid reflux   . Chronic back pain    Past Surgical History  Procedure Laterality Date  . Knee surgery     History reviewed. No pertinent family history. Social History  Substance Use Topics  . Smoking status: Current Every Day Smoker -- 0.50 packs/day for 40 years    Types: Cigarettes  . Smokeless tobacco: None  . Alcohol Use: Yes     Comment: 1/5 per day     Review of Systems 10/14 systems reviewed and are negative other than those stated in the HPI   Allergies  Review of patient's allergies indicates no known allergies.  Home Medications   Prior to Admission medications   Medication Sig Start Date End Date Taking? Authorizing Provider  naproxen sodium (ANAPROX) 220 MG tablet Take 440-660 mg by mouth every 12 (twelve) hours as needed (pain).   Yes Historical Provider, MD  omeprazole (PRILOSEC) 40 MG capsule Take 1 capsule (40 mg total) by mouth daily. 10/18/12  Yes Lloyd Huger T Mashburn, PA-C  naproxen (NAPROSYN) 500 MG tablet Take 1 tablet (500 mg total) by mouth 2 (two) times daily as needed. Take with  food/crackers. 11/21/14   Lavera Guise, MD  pravastatin (PRAVACHOL) 20 MG tablet Take 1 tablet (20 mg total) by mouth daily. Patient not taking: Reported on 11/21/2014 10/18/12   Tamala Julian, PA-C  QUEtiapine (SEROQUEL) 100 MG tablet Take 1 tablet (100 mg total) by mouth at bedtime. Patient not taking: Reported on 11/21/2014 10/18/12   Lloyd Huger T Mashburn, PA-C   BP 124/70 mmHg  Pulse 65  Temp(Src) 97.6 F (36.4 C) (Oral)  Resp 16  SpO2 96% Physical Exam Physical Exam  Nursing note and vitals reviewed. Constitutional: Well developed, well nourished, non-toxic, and in no acute distress Head: Normocephalic and atraumatic.  Mouth/Throat: Oropharynx is clear and moist.  Neck: Normal range of motion. Neck supple.  Cardiovascular: Normal rate and regular rhythm.  No edema. Pulmonary/Chest: Effort normal and breath sounds normal.  Abdominal: Soft. There is no tenderness. There is no rebound and no guarding. No CVA tenderness. Musculoskeletal: No swelling or deformities. ROM limited by pain in the right hip. No pain with log rolling.  Neurological: Alert, no facial droop, fluent speech, moves all extremities symmetrically, full strength hip flexion/extension, ankle dorsi/plantarflexion, and knee flexion/extension bilaterally, sensation to light touch in tact throughout Skin: Skin is warm and dry.  Psychiatric: Cooperative  ED Course  Procedures (including critical care time) Labs Review Labs Reviewed - No data to display  Imaging Review Dg Hip Unilat With Pelvis 2-3 Views Right  11/21/2014   CLINICAL DATA:  Increased RIGHT hip and groin pain this morning, has been walking more lately  EXAM: DG HIP (WITH OR WITHOUT PELVIS) 2-3V RIGHT  COMPARISON:  None  FINDINGS: Osseous demineralization.  Degenerative changes of both hip joints with joint space narrowing and minimal spur formation, greater on RIGHT.  Subchondral cystic changes at RIGHT acetabular roof.  SI joints symmetric and preserved.  No  acute fracture, dislocation or bone destruction.  Mild degenerative disc and facet disease changes at visualized lower lumbar spine.  IMPRESSION: Degenerative changes of both hip joints greater on RIGHT.   Electronically Signed   By: Ulyses Southward M.D.   On: 11/21/2014 08:18   I have personally reviewed and evaluated these images and lab results as part of my medical decision-making.    MDM   Final diagnoses:  Pain  Hip pain, right  Arthritis    64 year old male with history of chronic low back pain who presents with right pain in the setting of increased activity. He is well-appearing with normal vital signs. He has neuro vascularly intact lower extremities. Pain in the right hip reproducible with range of motion and weightbearing. X-ray reveals arthritis of bilateral hips, worst on the right which is the etiology of his symptoms. Presentation not concerning for intraabdominal or spine pathology. Given naprosyn, and discussed supportive care. Referral to orthopedic surgery given. Strict return and follow-up instructions reviewed. He expressed understanding of all discharge instructions and felt comfortable with the plan of care.   Lavera Guise, MD 11/21/14 [phone]

## 2015-05-09 HISTORY — PX: CARDIAC CATHETERIZATION: SHX172

## 2015-06-08 ENCOUNTER — Encounter (HOSPITAL_COMMUNITY): Payer: Self-pay

## 2015-06-08 ENCOUNTER — Emergency Department (HOSPITAL_COMMUNITY)
Admission: EM | Admit: 2015-06-08 | Discharge: 2015-06-09 | Disposition: A | Discharge status: Home / Self Care | Payer: Medicaid Other | Attending: Emergency Medicine | Admitting: Emergency Medicine

## 2015-06-08 DIAGNOSIS — G8929 Other chronic pain: Secondary | ICD-10-CM | POA: Insufficient documentation

## 2015-06-08 DIAGNOSIS — K219 Gastro-esophageal reflux disease without esophagitis: Secondary | ICD-10-CM | POA: Diagnosis not present

## 2015-06-08 DIAGNOSIS — F17210 Nicotine dependence, cigarettes, uncomplicated: Secondary | ICD-10-CM | POA: Diagnosis not present

## 2015-06-08 DIAGNOSIS — Z79899 Other long term (current) drug therapy: Secondary | ICD-10-CM | POA: Diagnosis not present

## 2015-06-08 DIAGNOSIS — Y9289 Other specified places as the place of occurrence of the external cause: Secondary | ICD-10-CM | POA: Insufficient documentation

## 2015-06-08 DIAGNOSIS — S4992XA Unspecified injury of left shoulder and upper arm, initial encounter: Secondary | ICD-10-CM | POA: Insufficient documentation

## 2015-06-08 DIAGNOSIS — Y998 Other external cause status: Secondary | ICD-10-CM | POA: Diagnosis not present

## 2015-06-08 DIAGNOSIS — S4991XA Unspecified injury of right shoulder and upper arm, initial encounter: Secondary | ICD-10-CM | POA: Diagnosis not present

## 2015-06-08 DIAGNOSIS — S29001A Unspecified injury of muscle and tendon of front wall of thorax, initial encounter: Secondary | ICD-10-CM | POA: Insufficient documentation

## 2015-06-08 DIAGNOSIS — F10920 Alcohol use, unspecified with intoxication, uncomplicated: Secondary | ICD-10-CM

## 2015-06-08 DIAGNOSIS — Y9389 Activity, other specified: Secondary | ICD-10-CM | POA: Diagnosis not present

## 2015-06-08 DIAGNOSIS — F10120 Alcohol abuse with intoxication, uncomplicated: Secondary | ICD-10-CM | POA: Diagnosis not present

## 2015-06-08 DIAGNOSIS — R0789 Other chest pain: Secondary | ICD-10-CM
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
## 2015-06-09 ENCOUNTER — Emergency Department (HOSPITAL_COMMUNITY): Payer: Medicaid Other

## 2015-06-09 NOTE — ED Notes (Signed)
Patient transported to X-ray 
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
## 2015-06-09 NOTE — Discharge Instructions (Signed)
Return to the ED with any concerns including difficulty breathing, vomiting and not able to keep down liquids, decreased level of alertness/lethargy, or any other alarming symptoms °

## 2015-06-26 ENCOUNTER — Other Ambulatory Visit: Payer: Self-pay | Admitting: *Deleted

## 2015-06-26 ENCOUNTER — Encounter: Payer: Self-pay | Admitting: Thoracic Surgery (Cardiothoracic Vascular Surgery)

## 2015-06-26 ENCOUNTER — Institutional Professional Consult (permissible substitution) (INDEPENDENT_AMBULATORY_CARE_PROVIDER_SITE_OTHER): Payer: Medicaid Other | Admitting: Thoracic Surgery (Cardiothoracic Vascular Surgery)

## 2015-06-26 VITALS — BP 93/70 | HR 62 | Resp 20 | Ht 69.0 in | Wt 196.0 lb

## 2015-06-26 DIAGNOSIS — I214 Non-ST elevation (NSTEMI) myocardial infarction: Secondary | ICD-10-CM | POA: Diagnosis not present

## 2015-06-26 DIAGNOSIS — E785 Hyperlipidemia, unspecified: Secondary | ICD-10-CM | POA: Diagnosis not present

## 2015-06-26 DIAGNOSIS — I2584 Coronary atherosclerosis due to calcified coronary lesion: Secondary | ICD-10-CM

## 2015-06-26 DIAGNOSIS — E119 Type 2 diabetes mellitus without complications: Secondary | ICD-10-CM | POA: Insufficient documentation

## 2015-06-26 DIAGNOSIS — I2510 Atherosclerotic heart disease of native coronary artery without angina pectoris: Secondary | ICD-10-CM | POA: Insufficient documentation

## 2015-06-26 DIAGNOSIS — I252 Old myocardial infarction: Secondary | ICD-10-CM

## 2015-06-26 HISTORY — DX: Atherosclerotic heart disease of native coronary artery without angina pectoris: I25.10

## 2015-06-26 HISTORY — DX: Old myocardial infarction: I25.2

## 2015-06-26 NOTE — Progress Notes (Signed)
PCP is No primary care provider on file. Referring Provider is Osei-Bonsu, George, MD  Chief Complaint  Patient presents with  . Coronary Artery Disease    Surgical eval,     HPI:65-year-old man sent for consultation regarding severe two-vessel coronary disease.  Mr. Kostelnik is a 64-year-old gentleman who is a poor historian. He recently was admitted to the hospital in [place] with chest pain (06/02/2015). He says he was in the hospital 4 days. He was told that he had coronary artery disease with one-vessel 100% blocked and the other 99% blocked and that he needed immediate surgery. They then found out that he had [place] Medicaid and he was discharged from the hospital. His daughter brought him back to [place]. He saw Dr. Osei Bonsu and was referred to me for coronary bypass grafting.  He has continued to have some mild chest tightness with exertion. It has not been as severe as the episode was in [place]. He says it and never last more than a couple of minutes and resolves when he rests. He does get short of breath with the chest tightness. He also complains of reflux, but says he can tell the difference between that and his "heart pain." He has been living on the street sleeping in a sleeping bag since he return to [place]. His daughter lives in [place], and is willing to take him in once he is safe to travel. He has a son in the [place], but has not seen him since he returned.   Past Medical History  Diagnosis Date  . Acid reflux   . Chronic back pain   . Chest pain   . Diabetes (HCC)   . Erectile dysfunction   . Hyperlipidemia   . CAD (coronary artery disease)   Type 2 diabetes with peripheral neuropathy Gastroesophageal reflux without esophagitis Peripheral vascular disease Chronic pain Tobacco abuse  Past Surgical History  Procedure Laterality Date  . Knee surgery      Family History  Problem Relation Age of Onset  . Cancer Mother      stomach  . Heart disease Father     Social History Social History  Substance Use Topics  . Smoking status: Current Every Day Smoker -- 0.50 packs/day for 40 years    Types: Cigarettes  . Smokeless tobacco: None  . Alcohol Use: Yes     Comment: 1/5 per day     Current Outpatient Prescriptions  Medication Sig Dispense Refill  . aspirin 325 MG tablet Take 325 mg by mouth daily.    . atorvastatin (LIPITOR) 80 MG tablet Take 80 mg by mouth daily at 6 PM.    . lisinopril (PRINIVIL,ZESTRIL) 5 MG tablet Take 5 mg by mouth daily.    . metoprolol succinate (TOPROL-XL) 25 MG 24 hr tablet Take 12.5 mg by mouth 2 (two) times daily.     No current facility-administered medications for this visit.    No Known Allergies  Review of Systems  Constitutional: Positive for activity change and fatigue. Negative for fever, chills, appetite change and unexpected weight change.  HENT: Negative for trouble swallowing and voice change.   Eyes: Negative for visual disturbance.  Respiratory: Positive for shortness of breath. Negative for wheezing.   Cardiovascular: Positive for chest pain. Negative for leg swelling.       Right leg "stays cold all the time"  Gastrointestinal: Positive for abdominal pain (reflux). Negative for blood in stool.  Genitourinary: Positive for frequency and   difficulty urinating.       Erectile dysfunction  Neurological:       Paresthesias both feet  Hematological: Negative for adenopathy. Does not bruise/bleed easily.  Psychiatric/Behavioral:       Sleep disturbance, hx psychosis  All other systems reviewed and are negative.   BP 93/70 mmHg  Pulse 62  Resp 20  Ht 5' 9" (1.753 m)  Wt 196 lb (88.905 kg)  BMI 28.93 kg/m2  SpO2 99% Physical Exam  Constitutional: He is oriented to person, place, and time. He appears well-developed and well-nourished. No distress.  HENT:  Head: Normocephalic and atraumatic.  Eyes: Conjunctivae and EOM are normal. No scleral icterus.   Neck: Neck supple. No thyromegaly present.  Cardiovascular: Normal rate, regular rhythm and normal heart sounds.  Exam reveals no gallop and no friction rub.   No murmur heard. Pulses:      Dorsalis pedis pulses are 0 on the right side, and 2+ on the left side.       Posterior tibial pulses are 0 on the right side, and 1+ on the left side.  Pulmonary/Chest: Effort normal and breath sounds normal. He has no wheezes. He has no rales.  Abdominal: Bowel sounds are normal. He exhibits no distension. There is no tenderness.  Musculoskeletal: He exhibits no edema.  Lymphadenopathy:    He has no cervical adenopathy.  Neurological: He is alert and oriented to person, place, and time. No cranial nerve deficit. He exhibits normal muscle tone.  Skin: Skin is warm and dry.  Vitals reviewed.    Diagnostic Tests: I personally reviewed the cardiac catheterization films from [facility] in [place] done in March 2017. I do not have the official report. It shows a totally occluded right coronary and a tight LAD lesion  Impression: 64-year-old man with multiple cardiac risk factors who had a non-ST elevation MI about 3 weeks ago in [place]. Cardiac catheterization showed a totally occluded right coronary and a tight LAD stenosis. He does not have any major stenoses in his ramus or circumflex. He does have some disease in a small diagonal, but I do not feel that vessel is graftable.  He needs coronary artery bypass grafting for survival benefit and relief symptoms in the setting of severe two-vessel disease.   I discussed the general nature of the procedure, need for general anesthesia,and incisions to be used with Mr. Kuhlman and his daughter Anita (by telephone). I have discussed the expected hospital stay, overall recovery and short and long term outcomes. I reviewed the indications, risks, benefits, and alternatives. They understand the risks include, but are not limited to  death, stroke, MI, DVT/PE, bleeding, possible need for transfusion, infections, cardiac arrhythmias, and other organ system dysfunction including respiratory, renal, or GI complications.   He accepts the risk and agrees to proceed.  I did inform them that he would need some place to stay in [place] for a couple of weeks before he is safe to travel back to [place]. I will see if I can have social work contact his daughter and the patient to see what options may be available. Anita Balles' phone number is [phone].  Plan: CABG Friday 06/29/15  Osiel Stick C Ziyad Dyar, MD [facility] [phone]  

## 2015-06-27 ENCOUNTER — Encounter (HOSPITAL_COMMUNITY): Payer: Self-pay

## 2015-06-27 ENCOUNTER — Ambulatory Visit (HOSPITAL_COMMUNITY)
Admission: RE | Admit: 2015-06-27 | Discharge: 2015-06-27 | Disposition: A | Discharge status: Home / Self Care | Payer: Medicaid Other | Source: Ambulatory Visit | Attending: Thoracic Surgery (Cardiothoracic Vascular Surgery) | Admitting: Thoracic Surgery (Cardiothoracic Vascular Surgery)

## 2015-06-27 ENCOUNTER — Encounter (HOSPITAL_COMMUNITY)
Admission: RE | Admit: 2015-06-27 | Discharge: 2015-06-27 | Disposition: A | Discharge status: Home / Self Care | Payer: Medicaid Other | Source: Ambulatory Visit | Attending: Thoracic Surgery (Cardiothoracic Vascular Surgery) | Admitting: Thoracic Surgery (Cardiothoracic Vascular Surgery)

## 2015-06-27 ENCOUNTER — Other Ambulatory Visit (HOSPITAL_COMMUNITY): Payer: Self-pay | Admitting: *Deleted

## 2015-06-27 VITALS — BP 127/80 | HR 52 | Temp 97.3°F | Resp 20 | Ht 69.0 in | Wt 174.1 lb

## 2015-06-27 DIAGNOSIS — Z0183 Encounter for blood typing: Secondary | ICD-10-CM | POA: Diagnosis not present

## 2015-06-27 DIAGNOSIS — Z01818 Encounter for other preprocedural examination: Secondary | ICD-10-CM | POA: Insufficient documentation

## 2015-06-27 DIAGNOSIS — Z01812 Encounter for preprocedural laboratory examination: Secondary | ICD-10-CM | POA: Insufficient documentation

## 2015-06-27 DIAGNOSIS — I2510 Atherosclerotic heart disease of native coronary artery without angina pectoris: Secondary | ICD-10-CM

## 2015-06-27 DIAGNOSIS — R001 Bradycardia, unspecified: Secondary | ICD-10-CM | POA: Diagnosis not present

## 2015-06-27 HISTORY — DX: Anemia, unspecified: D64.9

## 2015-06-27 HISTORY — DX: Peripheral vascular disease, unspecified: I73.9

## 2015-06-27 HISTORY — DX: Pneumonia, unspecified organism: J18.9

## 2015-06-27 HISTORY — DX: Chronic obstructive pulmonary disease, unspecified: J44.9

## 2015-06-27 HISTORY — DX: Unspecified osteoarthritis, unspecified site: M19.90

## 2015-06-27 HISTORY — DX: Essential (primary) hypertension: I10

## 2015-06-27 LAB — PULMONARY FUNCTION TEST
DL/VA % pred: 58 %
DL/VA: 2.69 ml/min/mmHg/L
DLCO UNC % PRED: 45 %
DLCO UNC: 14.15 ml/min/mmHg
DLCO cor % pred: 47 %
DLCO cor: 14.76 ml/min/mmHg
FEF 25-75 PRE: 1.45 L/s
FEF 25-75 Post: 1.92 L/sec
FEF2575-%Change-Post: 31 %
FEF2575-%Pred-Post: 71 %
FEF2575-%Pred-Pre: 54 %
FEV1-%Change-Post: 4 %
FEV1-%PRED-POST: 88 %
FEV1-%PRED-PRE: 84 %
FEV1-PRE: 2.49 L
FEV1-Post: 2.61 L
FEV1FVC-%Change-Post: 0 %
FEV1FVC-%Pred-Pre: 88 %
FEV6-%CHANGE-POST: 4 %
FEV6-%PRED-POST: 101 %
FEV6-%PRED-PRE: 97 %
FEV6-PRE: 3.55 L
FEV6-Post: 3.72 L
FEV6FVC-%CHANGE-POST: 0 %
FEV6FVC-%Pred-Post: 101 %
FEV6FVC-%Pred-Pre: 102 %
FVC-%Change-Post: 4 %
FVC-%Pred-Post: 99 %
FVC-%Pred-Pre: 95 %
FVC-Post: 3.8 L
FVC-Pre: 3.62 L
POST FEV6/FVC RATIO: 98 %
Post FEV1/FVC ratio: 69 %
Pre FEV1/FVC ratio: 69 %
Pre FEV6/FVC Ratio: 98 %
RV % PRED: 98 %
RV: 2.21 L
TLC % PRED: 91 %
TLC: 6.19 L

## 2015-06-27 LAB — COMPREHENSIVE METABOLIC PANEL
ALT: 14 U/L — ABNORMAL LOW (ref 17–63)
AST: 19 U/L (ref 15–41)
Albumin: 3.4 g/dL — ABNORMAL LOW (ref 3.5–5.0)
Alkaline Phosphatase: 118 U/L (ref 38–126)
Anion gap: 11 (ref 5–15)
BILIRUBIN TOTAL: 0.8 mg/dL (ref 0.3–1.2)
CO2: 19 mmol/L — ABNORMAL LOW (ref 22–32)
CREATININE: 0.67 mg/dL (ref 0.61–1.24)
Calcium: 9.3 mg/dL (ref 8.9–10.3)
Chloride: 110 mmol/L (ref 101–111)
Glucose, Bld: 130 mg/dL — ABNORMAL HIGH (ref 65–99)
Potassium: 3.7 mmol/L (ref 3.5–5.1)
Sodium: 140 mmol/L (ref 135–145)
TOTAL PROTEIN: 6.8 g/dL (ref 6.5–8.1)

## 2015-06-27 LAB — GLUCOSE, CAPILLARY: Glucose-Capillary: 79 mg/dL (ref 65–99)

## 2015-06-27 LAB — CBC
HEMATOCRIT: 40.1 % (ref 39.0–52.0)
Hemoglobin: 12.9 g/dL — ABNORMAL LOW (ref 13.0–17.0)
MCH: 25.7 pg — AB (ref 26.0–34.0)
MCHC: 32.2 g/dL (ref 30.0–36.0)
MCV: 80 fL (ref 78.0–100.0)
Platelets: 254 10*3/uL (ref 150–400)
RBC: 5.01 MIL/uL (ref 4.22–5.81)
RDW: 14.7 % (ref 11.5–15.5)
WBC: 11.4 10*3/uL — AB (ref 4.0–10.5)

## 2015-06-27 LAB — BLOOD GAS, ARTERIAL
ACID-BASE DEFICIT: 1 mmol/L (ref 0.0–2.0)
BICARBONATE: 22.6 meq/L (ref 20.0–24.0)
Drawn by: 421801
FIO2: 0.21
O2 Saturation: 98.2 %
PCO2 ART: 34.4 mmHg — AB (ref 35.0–45.0)
PH ART: 7.434 (ref 7.350–7.450)
PO2 ART: 110 mmHg — AB (ref 80.0–100.0)
Patient temperature: 98.6
TCO2: 23.7 mmol/L (ref 0–100)

## 2015-06-27 LAB — SURGICAL PCR SCREEN
MRSA, PCR: NEGATIVE
STAPHYLOCOCCUS AUREUS: NEGATIVE

## 2015-06-27 LAB — TYPE AND SCREEN
ABO/RH(D): O POS
ANTIBODY SCREEN: NEGATIVE

## 2015-06-27 LAB — PROTIME-INR
INR: 1.05 (ref 0.00–1.49)
PROTHROMBIN TIME: 13.9 s (ref 11.6–15.2)

## 2015-06-27 LAB — APTT: APTT: 33 s (ref 24–37)

## 2015-06-27 LAB — ABO/RH: ABO/RH(D): O POS

## 2015-06-27 MED ORDER — ALBUTEROL SULFATE (2.5 MG/3ML) 0.083% IN NEBU
2.5000 mg | INHALATION_SOLUTION | Freq: Once | RESPIRATORY_TRACT | Status: AC
  Administered 2015-06-27: 2.5 mg via RESPIRATORY_TRACT

## 2015-06-27 NOTE — Progress Notes (Signed)
Pt denies chest pain or sob. States right leg always feels cold. Recently diagnosed with diabetes, not on medications and does not have a CBG meter. Pt is staying with son at present time. States last use of cocaine was 04/2015. Hx of alcohol abuse (1/5 a day), states if he drinks it may be one or 2 drinks now.  Requesting cath report, any other heart studies and [facility] summary from [facility] in [place], [place].

## 2015-06-27 NOTE — Pre-Procedure Instructions (Addendum)
Andre CairoLarry Rojas  06/27/2015     Your procedure is scheduled on Friday, June 29, 2015 at 7:30 AM.   [facility] En[facility] Admitting Office at 5:30 AM.   Call this number if you have problems the morning of surgery: [phone]   Any questions prior to day of surgery, please call [phone] between 8 & 4 PM.   Remember:  Do not eat food or drink liquids after midnight Thursday, 06/28/15.  Take these medicines the morning of surgery with A SIP OF WATER: Metoprolol (Toprol XL), Pantoprazole (Protonix)   Do not wear jewelry.  Do not wear lotions, powders, or cologne.  You may NOT wear deodorant.  Men may shave face and neck.  Do not bring valuables to the hospital.  [facility] is not responsible for any belongings or valuables.  Contacts, dentures or bridgework may not be worn into surgery.  Leave your suitcase in the car.  After surgery it may be brought to your room.  For patients admitted to the hospital, discharge time will be determined by your treatment team.  Special instructions:  [facility] - Preparing for Surgery  Before surgery, you can play an important role.  Because skin is not sterile, your skin needs to be as free of germs as possible.  You can reduce the number of germs on you skin by washing with CHG (chlorahexidine gluconate) soap before surgery.  CHG is an antiseptic cleaner which kills germs and bonds with the skin to continue killing germs even after washing.  Please DO NOT use if you have an allergy to CHG or antibacterial soaps.  If your skin becomes reddened/irritated stop using the CHG and inform your nurse when you arrive at Short Stay.  Do not shave (including legs and underarms) for at least 48 hours prior to the first CHG shower.  You may shave your face.  Please follow these instructions carefully:   1.  Shower with CHG Soap the night before surgery and the                                morning of Surgery.  2.  If you choose to  wash your hair, wash your hair first as usual with your       normal shampoo.  3.  After you shampoo, rinse your hair and body thoroughly to remove the                      Shampoo.  4.  Use CHG as you would any other liquid soap.  You can apply chg directly       to the skin and wash gently with scrungie or a clean washcloth.  5.  Apply the CHG Soap to your body ONLY FROM THE NECK DOWN.        Do not use on open wounds or open sores.  Avoid contact with your eyes, ears, mouth and genitals (private parts).  Wash genitals (private parts) with your normal soap.  6.  Wash thoroughly, paying special attention to the area where your surgery        will be performed.  7.  Thoroughly rinse your body with warm water from the neck down.  8.  DO NOT shower/wash with your normal soap after using and rinsing off       the CHG Soap.  9.  Pat yourself  dry with a clean towel.            10.  Wear clean pajamas.            11.  Place clean sheets on your bed the night of your first shower and do not        sleep with pets.  Day of Surgery  Do not apply any lotions/deodorants the morning of surgery.  Please wear clean clothes to the hospital.   Please read over the following fact sheets that you were given. Pain Booklet, Coughing and Deep Breathing, Blood Transfusion Information, MRSA Information and Surgical Site Infection Prevention

## 2015-06-27 NOTE — Progress Notes (Signed)
Pre-op Cardiac Surgery  Carotid Findings:  Prior study on 06/14/2015 called Ryan,RN-did not have to repeat this part of the exam.  Upper Extremity Right Left  Brachial Pressures 132 Triphasic 151 Triphasic  Radial Waveforms  Triphasic  Triphasic  Ulnar Waveforms  Triphasic  Triphasic  Palmar Arch (Allen's Test)  abnormal  abnormal   Findings:   Doppler waveforms remain normal with only in the radial artery-bilateral with compression at rest.  Abnormal right ulnar artery decreased>50% and abnormal in the left ulnar artery with compression at rest.   Lower  Extremity Right Left  Dorsalis Pedis 123 Triphasic 115 Triphasic  Posterior Tibial 140 Triphasic 124 Biphasic  Ankle/Brachial Indices 0.93 0.82    Findings:   Bilaterally ABI's and Doppler waveforms suggestive mild Arterial disease at rest.

## 2015-06-28 LAB — HEMOGLOBIN A1C
HEMOGLOBIN A1C: 6.1 % — AB (ref 4.8–5.6)
Mean Plasma Glucose: 128 mg/dL

## 2015-06-28 MED ORDER — DOPAMINE-DEXTROSE 3.2-5 MG/ML-% IV SOLN
0.0000 ug/kg/min | INTRAVENOUS | Status: DC
  Filled 2015-06-28: qty 250

## 2015-06-28 MED ORDER — SODIUM CHLORIDE 0.9 % IV SOLN
INTRAVENOUS | Status: AC
  Administered 2015-06-29: .8 [IU]/h via INTRAVENOUS
  Administered 2015-06-29: 1 [IU]/h via INTRAVENOUS
  Filled 2015-06-28: qty 2.5

## 2015-06-28 MED ORDER — SODIUM CHLORIDE 0.9 % IV SOLN
INTRAVENOUS | Status: AC
  Administered 2015-06-29: 70 mL/h via INTRAVENOUS
  Filled 2015-06-28: qty 40

## 2015-06-28 MED ORDER — DEXTROSE 5 % IV SOLN
750.0000 mg | INTRAVENOUS | Status: DC
  Filled 2015-06-28: qty 750

## 2015-06-28 MED ORDER — HEPARIN SODIUM (PORCINE) 1000 UNIT/ML IJ SOLN
INTRAMUSCULAR | Status: DC
  Filled 2015-06-28: qty 30

## 2015-06-28 MED ORDER — EPINEPHRINE HCL 1 MG/ML IJ SOLN
0.0000 ug/min | INTRAVENOUS | Status: DC
  Filled 2015-06-28: qty 4

## 2015-06-28 MED ORDER — POTASSIUM CHLORIDE 2 MEQ/ML IV SOLN
80.0000 meq | INTRAVENOUS | Status: DC
  Filled 2015-06-28: qty 40

## 2015-06-28 MED ORDER — DEXMEDETOMIDINE HCL IN NACL 400 MCG/100ML IV SOLN
0.1000 ug/kg/h | INTRAVENOUS | Status: AC
  Administered 2015-06-29: .3 ug/kg/h via INTRAVENOUS
  Filled 2015-06-28: qty 100

## 2015-06-28 MED ORDER — NITROGLYCERIN IN D5W 200-5 MCG/ML-% IV SOLN
2.0000 ug/min | INTRAVENOUS | Status: AC
  Administered 2015-06-29: 10 ug/min via INTRAVENOUS
  Filled 2015-06-28: qty 250

## 2015-06-28 MED ORDER — SODIUM CHLORIDE 0.9 % IV SOLN
1250.0000 mg | INTRAVENOUS | Status: AC
  Administered 2015-06-29: 1250 mg via INTRAVENOUS
  Filled 2015-06-28: qty 1250

## 2015-06-28 MED ORDER — PHENYLEPHRINE HCL 10 MG/ML IJ SOLN
30.0000 ug/min | INTRAVENOUS | Status: AC
  Administered 2015-06-29: 10 ug/min via INTRAVENOUS
  Filled 2015-06-28: qty 2

## 2015-06-28 MED ORDER — PAPAVERINE HCL 30 MG/ML IJ SOLN
INTRAMUSCULAR | Status: AC
  Administered 2015-06-29: 500 mL
  Filled 2015-06-28: qty 2.5

## 2015-06-28 MED ORDER — MAGNESIUM SULFATE 50 % IJ SOLN
40.0000 meq | INTRAMUSCULAR | Status: DC
  Filled 2015-06-28: qty 10

## 2015-06-28 MED ORDER — METOPROLOL TARTRATE 12.5 MG HALF TABLET
12.5000 mg | ORAL_TABLET | Freq: Once | ORAL | Status: AC
  Administered 2015-06-29: 12.5 mg via ORAL
  Filled 2015-06-28: qty 1

## 2015-06-28 MED ORDER — DEXTROSE 5 % IV SOLN
1.5000 g | INTRAVENOUS | Status: AC
  Administered 2015-06-29: 1.5 g via INTRAVENOUS
  Administered 2015-06-29: .75 g via INTRAVENOUS
  Filled 2015-06-28 (×2): qty 1.5

## 2015-06-29 ENCOUNTER — Inpatient Hospital Stay (HOSPITAL_COMMUNITY): Payer: Medicaid Other | Admitting: Anesthesiology

## 2015-06-29 ENCOUNTER — Inpatient Hospital Stay (HOSPITAL_COMMUNITY): Payer: Medicaid Other

## 2015-06-29 ENCOUNTER — Inpatient Hospital Stay (HOSPITAL_COMMUNITY)
Admission: RE | Admit: 2015-06-29 | Discharge: 2015-07-03 | DRG: 235 | Disposition: A | Discharge status: Home / Self Care | Payer: Medicaid Other | Source: Ambulatory Visit | Attending: Thoracic Surgery (Cardiothoracic Vascular Surgery) | Admitting: Thoracic Surgery (Cardiothoracic Vascular Surgery)

## 2015-06-29 ENCOUNTER — Encounter (HOSPITAL_COMMUNITY)
Admission: RE | Disposition: A | Discharge status: Home / Self Care | Payer: Self-pay | Source: Ambulatory Visit | Attending: Thoracic Surgery (Cardiothoracic Vascular Surgery)

## 2015-06-29 ENCOUNTER — Encounter (HOSPITAL_COMMUNITY): Payer: Self-pay | Admitting: *Deleted

## 2015-06-29 DIAGNOSIS — D62 Acute posthemorrhagic anemia: Secondary | ICD-10-CM | POA: Diagnosis not present

## 2015-06-29 DIAGNOSIS — I214 Non-ST elevation (NSTEMI) myocardial infarction: Secondary | ICD-10-CM | POA: Diagnosis present

## 2015-06-29 DIAGNOSIS — I2510 Atherosclerotic heart disease of native coronary artery without angina pectoris: Secondary | ICD-10-CM | POA: Diagnosis present

## 2015-06-29 DIAGNOSIS — E785 Hyperlipidemia, unspecified: Secondary | ICD-10-CM | POA: Diagnosis present

## 2015-06-29 DIAGNOSIS — E8770 Fluid overload, unspecified: Secondary | ICD-10-CM | POA: Diagnosis not present

## 2015-06-29 DIAGNOSIS — Z79899 Other long term (current) drug therapy: Secondary | ICD-10-CM | POA: Diagnosis not present

## 2015-06-29 DIAGNOSIS — J449 Chronic obstructive pulmonary disease, unspecified: Secondary | ICD-10-CM | POA: Diagnosis present

## 2015-06-29 DIAGNOSIS — Z7982 Long term (current) use of aspirin: Secondary | ICD-10-CM

## 2015-06-29 DIAGNOSIS — E1142 Type 2 diabetes mellitus with diabetic polyneuropathy: Secondary | ICD-10-CM | POA: Diagnosis present

## 2015-06-29 DIAGNOSIS — M13851 Other specified arthritis, right hip: Secondary | ICD-10-CM | POA: Diagnosis present

## 2015-06-29 DIAGNOSIS — I25110 Atherosclerotic heart disease of native coronary artery with unstable angina pectoris: Secondary | ICD-10-CM | POA: Diagnosis not present

## 2015-06-29 DIAGNOSIS — I10 Essential (primary) hypertension: Secondary | ICD-10-CM | POA: Diagnosis present

## 2015-06-29 DIAGNOSIS — F17210 Nicotine dependence, cigarettes, uncomplicated: Secondary | ICD-10-CM | POA: Diagnosis present

## 2015-06-29 DIAGNOSIS — Z951 Presence of aortocoronary bypass graft: Secondary | ICD-10-CM

## 2015-06-29 DIAGNOSIS — I2582 Chronic total occlusion of coronary artery: Secondary | ICD-10-CM | POA: Diagnosis present

## 2015-06-29 DIAGNOSIS — J9811 Atelectasis: Secondary | ICD-10-CM

## 2015-06-29 HISTORY — PX: CORONARY ARTERY BYPASS GRAFT: SHX141

## 2015-06-29 HISTORY — PX: TEE WITHOUT CARDIOVERSION: SHX5443

## 2015-06-29 HISTORY — DX: Presence of aortocoronary bypass graft: Z95.1

## 2015-06-29 LAB — POCT I-STAT, CHEM 8
BUN: 4 mg/dL — AB (ref 6–20)
BUN: 3 mg/dL — AB (ref 6–20)
BUN: 3 mg/dL — ABNORMAL LOW (ref 6–20)
BUN: 3 mg/dL — AB (ref 6–20)
BUN: 5 mg/dL — AB (ref 6–20)
CALCIUM ION: 1.12 mmol/L — AB (ref 1.13–1.30)
CALCIUM ION: 1.15 mmol/L (ref 1.13–1.30)
CALCIUM ION: 1.14 mmol/L (ref 1.13–1.30)
CHLORIDE: 105 mmol/L (ref 101–111)
CHLORIDE: 105 mmol/L (ref 101–111)
CHLORIDE: 105 mmol/L (ref 101–111)
CHLORIDE: 109 mmol/L (ref 101–111)
CREATININE: 0.4 mg/dL — AB (ref 0.61–1.24)
CREATININE: 0.4 mg/dL — AB (ref 0.61–1.24)
CREATININE: 0.4 mg/dL — AB (ref 0.61–1.24)
Calcium, Ion: 1.21 mmol/L (ref 1.13–1.30)
Calcium, Ion: 1.22 mmol/L (ref 1.13–1.30)
Calcium, Ion: 1.18 mmol/L (ref 1.13–1.30)
Chloride: 105 mmol/L (ref 101–111)
Chloride: 103 mmol/L (ref 101–111)
Creatinine, Ser: 0.4 mg/dL — ABNORMAL LOW (ref 0.61–1.24)
Creatinine, Ser: 0.5 mg/dL — ABNORMAL LOW (ref 0.61–1.24)
Creatinine, Ser: 0.6 mg/dL — ABNORMAL LOW (ref 0.61–1.24)
GLUCOSE: 117 mg/dL — AB (ref 65–99)
GLUCOSE: 112 mg/dL — AB (ref 65–99)
Glucose, Bld: 96 mg/dL (ref 65–99)
Glucose, Bld: 130 mg/dL — ABNORMAL HIGH (ref 65–99)
Glucose, Bld: 115 mg/dL — ABNORMAL HIGH (ref 65–99)
Glucose, Bld: 136 mg/dL — ABNORMAL HIGH (ref 65–99)
HCT: 27 % — ABNORMAL LOW (ref 39.0–52.0)
HCT: 28 % — ABNORMAL LOW (ref 39.0–52.0)
HCT: 32 % — ABNORMAL LOW (ref 39.0–52.0)
HEMATOCRIT: 38 % — AB (ref 39.0–52.0)
HEMATOCRIT: 32 % — AB (ref 39.0–52.0)
HEMATOCRIT: 29 % — AB (ref 39.0–52.0)
HEMOGLOBIN: 10.9 g/dL — AB (ref 13.0–17.0)
Hemoglobin: 12.9 g/dL — ABNORMAL LOW (ref 13.0–17.0)
Hemoglobin: 9.2 g/dL — ABNORMAL LOW (ref 13.0–17.0)
Hemoglobin: 9.5 g/dL — ABNORMAL LOW (ref 13.0–17.0)
Hemoglobin: 10.9 g/dL — ABNORMAL LOW (ref 13.0–17.0)
Hemoglobin: 9.9 g/dL — ABNORMAL LOW (ref 13.0–17.0)
POTASSIUM: 3.7 mmol/L (ref 3.5–5.1)
POTASSIUM: 4.4 mmol/L (ref 3.5–5.1)
Potassium: 3.4 mmol/L — ABNORMAL LOW (ref 3.5–5.1)
Potassium: 5 mmol/L (ref 3.5–5.1)
Potassium: 5 mmol/L (ref 3.5–5.1)
Potassium: 4.2 mmol/L (ref 3.5–5.1)
SODIUM: 143 mmol/L (ref 135–145)
SODIUM: 141 mmol/L (ref 135–145)
SODIUM: 144 mmol/L (ref 135–145)
Sodium: 139 mmol/L (ref 135–145)
Sodium: 141 mmol/L (ref 135–145)
Sodium: 140 mmol/L (ref 135–145)
TCO2: 25 mmol/L (ref 0–100)
TCO2: 24 mmol/L (ref 0–100)
TCO2: 26 mmol/L (ref 0–100)
TCO2: 25 mmol/L (ref 0–100)
TCO2: 25 mmol/L (ref 0–100)
TCO2: 21 mmol/L (ref 0–100)

## 2015-06-29 LAB — URINALYSIS, ROUTINE W REFLEX MICROSCOPIC
Bilirubin Urine: NEGATIVE
GLUCOSE, UA: NEGATIVE mg/dL
Hgb urine dipstick: NEGATIVE
Ketones, ur: NEGATIVE mg/dL
LEUKOCYTES UA: NEGATIVE
Nitrite: NEGATIVE
Protein, ur: NEGATIVE mg/dL
SPECIFIC GRAVITY, URINE: 1.023 (ref 1.005–1.030)
pH: 5.5 (ref 5.0–8.0)

## 2015-06-29 LAB — POCT I-STAT 3, ART BLOOD GAS (G3+)
ACID-BASE DEFICIT: 2 mmol/L (ref 0.0–2.0)
ACID-BASE EXCESS: 1 mmol/L (ref 0.0–2.0)
Acid-Base Excess: 1 mmol/L (ref 0.0–2.0)
Acid-base deficit: 4 mmol/L — ABNORMAL HIGH (ref 0.0–2.0)
BICARBONATE: 24.3 meq/L — AB (ref 20.0–24.0)
BICARBONATE: 21.1 meq/L (ref 20.0–24.0)
Bicarbonate: 26.5 mEq/L — ABNORMAL HIGH (ref 20.0–24.0)
Bicarbonate: 24 mEq/L (ref 20.0–24.0)
O2 SAT: 100 %
O2 SAT: 100 %
O2 Saturation: 98 %
O2 Saturation: 95 %
PCO2 ART: 29.3 mmHg — AB (ref 35.0–45.0)
PCO2 ART: 40.3 mmHg (ref 35.0–45.0)
PH ART: 7.331 — AB (ref 7.350–7.450)
PH ART: 7.333 — AB (ref 7.350–7.450)
PO2 ART: 294 mmHg — AB (ref 80.0–100.0)
PO2 ART: 86 mmHg (ref 80.0–100.0)
Patient temperature: 33.5
Patient temperature: 37.1
Patient temperature: 38.1
TCO2: 25 mmol/L (ref 0–100)
TCO2: 28 mmol/L (ref 0–100)
TCO2: 25 mmol/L (ref 0–100)
TCO2: 22 mmol/L (ref 0–100)
pCO2 arterial: 45.3 mmHg — ABNORMAL HIGH (ref 35.0–45.0)
pCO2 arterial: 45.6 mmHg — ABNORMAL HIGH (ref 35.0–45.0)
pH, Arterial: 7.513 — ABNORMAL HIGH (ref 7.350–7.450)
pH, Arterial: 7.376 (ref 7.350–7.450)
pO2, Arterial: 193 mmHg — ABNORMAL HIGH (ref 80.0–100.0)
pO2, Arterial: 115 mmHg — ABNORMAL HIGH (ref 80.0–100.0)

## 2015-06-29 LAB — GLUCOSE, CAPILLARY
GLUCOSE-CAPILLARY: 91 mg/dL (ref 65–99)
GLUCOSE-CAPILLARY: 87 mg/dL (ref 65–99)
GLUCOSE-CAPILLARY: 94 mg/dL (ref 65–99)
Glucose-Capillary: 100 mg/dL — ABNORMAL HIGH (ref 65–99)
Glucose-Capillary: 122 mg/dL — ABNORMAL HIGH (ref 65–99)
Glucose-Capillary: 124 mg/dL — ABNORMAL HIGH (ref 65–99)

## 2015-06-29 LAB — APTT: aPTT: 36 seconds (ref 24–37)

## 2015-06-29 LAB — POCT I-STAT 3, VENOUS BLOOD GAS (G3P V)
ACID-BASE DEFICIT: 1 mmol/L (ref 0.0–2.0)
BICARBONATE: 23.2 meq/L (ref 20.0–24.0)
Bicarbonate: 25.5 mEq/L — ABNORMAL HIGH (ref 20.0–24.0)
O2 SAT: 98 %
O2 Saturation: 79 %
PCO2 VEN: 31.4 mmHg — AB (ref 45.0–50.0)
PCO2 VEN: 40.8 mmHg — AB (ref 45.0–50.0)
PO2 VEN: 33 mmHg (ref 31.0–45.0)
PO2 VEN: 93 mmHg — AB (ref 31.0–45.0)
Patient temperature: 33.5
Patient temperature: 35.5
TCO2: 24 mmol/L (ref 0–100)
TCO2: 27 mmol/L (ref 0–100)
pH, Ven: 7.463 — ABNORMAL HIGH (ref 7.250–7.300)
pH, Ven: 7.396 — ABNORMAL HIGH (ref 7.250–7.300)

## 2015-06-29 LAB — CBC
HCT: 30.8 % — ABNORMAL LOW (ref 39.0–52.0)
HEMATOCRIT: 30.8 % — AB (ref 39.0–52.0)
HEMOGLOBIN: 9.5 g/dL — AB (ref 13.0–17.0)
Hemoglobin: 9.5 g/dL — ABNORMAL LOW (ref 13.0–17.0)
MCH: 24.7 pg — AB (ref 26.0–34.0)
MCH: 24.9 pg — ABNORMAL LOW (ref 26.0–34.0)
MCHC: 30.8 g/dL (ref 30.0–36.0)
MCHC: 30.8 g/dL (ref 30.0–36.0)
MCV: 80.2 fL (ref 78.0–100.0)
MCV: 80.8 fL (ref 78.0–100.0)
PLATELETS: 159 10*3/uL (ref 150–400)
Platelets: 150 10*3/uL (ref 150–400)
RBC: 3.84 MIL/uL — ABNORMAL LOW (ref 4.22–5.81)
RBC: 3.81 MIL/uL — AB (ref 4.22–5.81)
RDW: 14.8 % (ref 11.5–15.5)
RDW: 15 % (ref 11.5–15.5)
WBC: 12.4 10*3/uL — ABNORMAL HIGH (ref 4.0–10.5)
WBC: 13.4 10*3/uL — ABNORMAL HIGH (ref 4.0–10.5)

## 2015-06-29 LAB — CREATININE, SERUM
CREATININE: 0.77 mg/dL (ref 0.61–1.24)
GFR calc non Af Amer: >60 mL/min (ref >60)

## 2015-06-29 LAB — POCT I-STAT 4, (NA,K, GLUC, HGB,HCT)
GLUCOSE: 103 mg/dL — AB (ref 65–99)
HEMATOCRIT: 29 % — AB (ref 39.0–52.0)
HEMOGLOBIN: 9.9 g/dL — AB (ref 13.0–17.0)
Potassium: 3.9 mmol/L (ref 3.5–5.1)
Sodium: 142 mmol/L (ref 135–145)

## 2015-06-29 LAB — PLATELET COUNT: Platelets: 181 10*3/uL (ref 150–400)

## 2015-06-29 LAB — PROTIME-INR
INR: 1.58 — ABNORMAL HIGH (ref 0.00–1.49)
Prothrombin Time: 18.9 seconds — ABNORMAL HIGH (ref 11.6–15.2)

## 2015-06-29 LAB — HEMOGLOBIN AND HEMATOCRIT, BLOOD
HCT: 27.2 % — ABNORMAL LOW (ref 39.0–52.0)
Hemoglobin: 8.5 g/dL — ABNORMAL LOW (ref 13.0–17.0)

## 2015-06-29 LAB — MAGNESIUM: MAGNESIUM: 2.8 mg/dL — AB (ref 1.7–2.4)

## 2015-06-29 SURGERY — CORONARY ARTERY BYPASS GRAFTING (CABG)
Anesthesia: General | Site: Chest

## 2015-06-29 MED ORDER — OXYCODONE HCL 5 MG PO TABS
5.0000 mg | ORAL_TABLET | ORAL | Status: DC | PRN
  Administered 2015-06-29 – 2015-07-03 (×9): 10 mg via ORAL
  Filled 2015-06-29 (×10): qty 2

## 2015-06-29 MED ORDER — MAGNESIUM SULFATE 4 GM/100ML IV SOLN
4.0000 g | Freq: Once | INTRAVENOUS | Status: AC
  Administered 2015-06-29: 4 g via INTRAVENOUS
  Filled 2015-06-29: qty 100

## 2015-06-29 MED ORDER — ARTIFICIAL TEARS OP OINT
TOPICAL_OINTMENT | OPHTHALMIC | Status: AC
Start: 2015-06-29 — End: 2015-06-29
  Filled 2015-06-29: qty 3.5

## 2015-06-29 MED ORDER — NITROGLYCERIN IN D5W 200-5 MCG/ML-% IV SOLN: 0.0000 ug/min | INTRAVENOUS | Status: DC

## 2015-06-29 MED ORDER — CHLORHEXIDINE GLUCONATE 0.12% ORAL RINSE (MEDLINE KIT): 15.0000 mL | Freq: Two times a day (BID) | OROMUCOSAL | Status: DC

## 2015-06-29 MED ORDER — SODIUM CHLORIDE 0.9 % IV SOLN: INTRAVENOUS | Status: DC

## 2015-06-29 MED ORDER — ASPIRIN EC 325 MG PO TBEC
325.0000 mg | DELAYED_RELEASE_TABLET | Freq: Every day | ORAL | Status: DC
  Administered 2015-06-30 – 2015-07-03 (×4): 325 mg via ORAL
  Filled 2015-06-29 (×4): qty 1

## 2015-06-29 MED ORDER — SODIUM CHLORIDE 0.9 % IV SOLN
INTRAVENOUS | Status: DC
  Administered 2015-06-29: 1.5 [IU]/h via INTRAVENOUS
  Filled 2015-06-29: qty 2.5

## 2015-06-29 MED ORDER — ALBUMIN HUMAN 5 % IV SOLN
INTRAVENOUS | Status: DC | PRN
  Administered 2015-06-29: 11:00:00 via INTRAVENOUS

## 2015-06-29 MED ORDER — VECURONIUM BROMIDE 10 MG IV SOLR
INTRAVENOUS | Status: DC | PRN
  Administered 2015-06-29 (×4): 5 mg via INTRAVENOUS

## 2015-06-29 MED ORDER — MIDAZOLAM HCL 10 MG/2ML IJ SOLN
INTRAMUSCULAR | Status: AC
  Filled 2015-06-29: qty 2

## 2015-06-29 MED ORDER — LACTATED RINGERS IV SOLN: INTRAVENOUS | Status: DC

## 2015-06-29 MED ORDER — PROTAMINE SULFATE 10 MG/ML IV SOLN
INTRAVENOUS | Status: AC
Start: 2015-06-29 — End: 2015-06-29
  Filled 2015-06-29: qty 25

## 2015-06-29 MED ORDER — ONDANSETRON HCL 4 MG/2ML IJ SOLN: 4.0000 mg | Freq: Four times a day (QID) | INTRAMUSCULAR | Status: DC | PRN

## 2015-06-29 MED ORDER — FAMOTIDINE IN NACL 20-0.9 MG/50ML-% IV SOLN
20.0000 mg | Freq: Two times a day (BID) | INTRAVENOUS | Status: DC
  Administered 2015-06-29: 20 mg via INTRAVENOUS

## 2015-06-29 MED ORDER — DOCUSATE SODIUM 100 MG PO CAPS
200.0000 mg | ORAL_CAPSULE | Freq: Every day | ORAL | Status: DC
  Administered 2015-06-30 – 2015-07-03 (×4): 200 mg via ORAL
  Filled 2015-06-29 (×4): qty 2

## 2015-06-29 MED ORDER — ACETAMINOPHEN 160 MG/5ML PO SOLN: 1000.0000 mg | Freq: Four times a day (QID) | ORAL | Status: DC

## 2015-06-29 MED ORDER — STERILE WATER FOR INJECTION IJ SOLN
INTRAMUSCULAR | Status: AC
  Filled 2015-06-29: qty 10

## 2015-06-29 MED ORDER — BISACODYL 5 MG PO TBEC
10.0000 mg | DELAYED_RELEASE_TABLET | Freq: Every day | ORAL | Status: DC
  Administered 2015-06-30 – 2015-07-03 (×4): 10 mg via ORAL
  Filled 2015-06-29 (×4): qty 2

## 2015-06-29 MED ORDER — ANTISEPTIC ORAL RINSE SOLUTION (CORINZ)
7.0000 mL | Freq: Four times a day (QID) | OROMUCOSAL | Status: DC
  Administered 2015-06-29: 7 mL via OROMUCOSAL

## 2015-06-29 MED ORDER — POTASSIUM CHLORIDE 10 MEQ/50ML IV SOLN
10.0000 meq | INTRAVENOUS | Status: AC
  Administered 2015-06-29 (×3): 10 meq via INTRAVENOUS

## 2015-06-29 MED ORDER — 0.9 % SODIUM CHLORIDE (POUR BTL) OPTIME
TOPICAL | Status: DC | PRN
  Administered 2015-06-29: 5000 mL

## 2015-06-29 MED ORDER — FENTANYL CITRATE (PF) 250 MCG/5ML IJ SOLN
INTRAMUSCULAR | Status: AC
  Filled 2015-06-29: qty 30

## 2015-06-29 MED ORDER — LACTATED RINGERS IV SOLN
INTRAVENOUS | Status: DC | PRN
  Administered 2015-06-29: 07:00:00 via INTRAVENOUS

## 2015-06-29 MED ORDER — ARTIFICIAL TEARS OP OINT
TOPICAL_OINTMENT | OPHTHALMIC | Status: DC | PRN
  Administered 2015-06-29: 1 via OPHTHALMIC

## 2015-06-29 MED ORDER — CETYLPYRIDINIUM CHLORIDE 0.05 % MT LIQD
7.0000 mL | Freq: Two times a day (BID) | OROMUCOSAL | Status: DC
  Administered 2015-06-30 – 2015-07-03 (×5): 7 mL via OROMUCOSAL

## 2015-06-29 MED ORDER — METOPROLOL TARTRATE 25 MG/10 ML ORAL SUSPENSION
12.5000 mg | Freq: Two times a day (BID) | ORAL | Status: DC
Start: 2015-06-29 — End: 2015-06-30

## 2015-06-29 MED ORDER — INSULIN REGULAR BOLUS VIA INFUSION
0.0000 [IU] | Freq: Three times a day (TID) | INTRAVENOUS | Status: DC
  Administered 2015-06-30: 2.5 [IU] via INTRAVENOUS
  Filled 2015-06-29: qty 10

## 2015-06-29 MED ORDER — MIDAZOLAM HCL 5 MG/5ML IJ SOLN
INTRAMUSCULAR | Status: DC | PRN
  Administered 2015-06-29: 2 mg via INTRAVENOUS
  Administered 2015-06-29: 4 mg via INTRAVENOUS
  Administered 2015-06-29 (×2): 5 mg via INTRAVENOUS
  Administered 2015-06-29 (×2): 2 mg via INTRAVENOUS

## 2015-06-29 MED ORDER — EPHEDRINE SULFATE 50 MG/ML IJ SOLN
INTRAMUSCULAR | Status: AC
Start: 2015-06-29 — End: 2015-06-29
  Filled 2015-06-29: qty 1

## 2015-06-29 MED ORDER — ACETAMINOPHEN 650 MG RE SUPP
650.0000 mg | Freq: Once | RECTAL | Status: AC
  Administered 2015-06-29: 650 mg via RECTAL

## 2015-06-29 MED ORDER — ACETAMINOPHEN 160 MG/5ML PO SOLN: 650.0000 mg | Freq: Once | ORAL | Status: AC

## 2015-06-29 MED ORDER — PROPOFOL 10 MG/ML IV BOLUS
INTRAVENOUS | Status: AC
  Filled 2015-06-29: qty 40

## 2015-06-29 MED ORDER — SODIUM CHLORIDE 0.9 % IV SOLN
INTRAVENOUS | Status: DC | PRN
  Administered 2015-06-29: 08:00:00 via INTRAVENOUS

## 2015-06-29 MED ORDER — CHLORHEXIDINE GLUCONATE 0.12 % MT SOLN
15.0000 mL | OROMUCOSAL | Status: AC
  Administered 2015-06-29: 15 mL via OROMUCOSAL

## 2015-06-29 MED ORDER — METOPROLOL TARTRATE 12.5 MG HALF TABLET
12.5000 mg | ORAL_TABLET | Freq: Two times a day (BID) | ORAL | Status: DC
  Administered 2015-06-30 (×2): 12.5 mg via ORAL
  Filled 2015-06-29 (×2): qty 1

## 2015-06-29 MED ORDER — CHLORHEXIDINE GLUCONATE 0.12 % MT SOLN
15.0000 mL | Freq: Two times a day (BID) | OROMUCOSAL | Status: DC
  Administered 2015-06-29 – 2015-07-03 (×7): 15 mL via OROMUCOSAL
  Filled 2015-06-29 (×6): qty 15

## 2015-06-29 MED ORDER — HEPARIN SODIUM (PORCINE) 1000 UNIT/ML IJ SOLN
INTRAMUSCULAR | Status: AC
  Filled 2015-06-29: qty 1

## 2015-06-29 MED ORDER — ESMOLOL HCL 100 MG/10ML IV SOLN
INTRAVENOUS | Status: DC | PRN
  Administered 2015-06-29: 20 mg via INTRAVENOUS

## 2015-06-29 MED ORDER — PHENYLEPHRINE HCL 10 MG/ML IJ SOLN: 0.0000 ug/min | INTRAVENOUS | Status: DC

## 2015-06-29 MED ORDER — ACETAMINOPHEN 500 MG PO TABS
1000.0000 mg | ORAL_TABLET | Freq: Four times a day (QID) | ORAL | Status: DC
  Administered 2015-06-29 – 2015-07-03 (×12): 1000 mg via ORAL
  Filled 2015-06-29 (×12): qty 2

## 2015-06-29 MED ORDER — DEXTROSE 5 % IV SOLN
1.5000 g | Freq: Two times a day (BID) | INTRAVENOUS | Status: AC
  Administered 2015-06-29 – 2015-07-01 (×4): 1.5 g via INTRAVENOUS
  Filled 2015-06-29 (×4): qty 1.5

## 2015-06-29 MED ORDER — VECURONIUM BROMIDE 10 MG IV SOLR
INTRAVENOUS | Status: AC
  Filled 2015-06-29: qty 10

## 2015-06-29 MED ORDER — MIDAZOLAM HCL 2 MG/2ML IJ SOLN: 2.0000 mg | INTRAMUSCULAR | Status: DC | PRN

## 2015-06-29 MED ORDER — PANTOPRAZOLE SODIUM 40 MG PO TBEC: 40.0000 mg | DELAYED_RELEASE_TABLET | Freq: Every day | ORAL | Status: DC

## 2015-06-29 MED ORDER — SODIUM CHLORIDE 0.9 % IJ SOLN
INTRAMUSCULAR | Status: AC
Start: 2015-06-29 — End: 2015-06-29
  Filled 2015-06-29: qty 10

## 2015-06-29 MED ORDER — SODIUM CHLORIDE 0.9% FLUSH
3.0000 mL | Freq: Two times a day (BID) | INTRAVENOUS | Status: DC
  Administered 2015-06-30 – 2015-07-01 (×3): 3 mL via INTRAVENOUS

## 2015-06-29 MED ORDER — MORPHINE SULFATE (PF) 2 MG/ML IV SOLN: 1.0000 mg | INTRAVENOUS | Status: DC | PRN

## 2015-06-29 MED ORDER — DEXTROSE 5 % IV SOLN
INTRAVENOUS | Status: DC | PRN
  Administered 2015-06-29: 08:00:00 via INTRAVENOUS

## 2015-06-29 MED ORDER — PANTOPRAZOLE SODIUM 40 MG PO TBEC
40.0000 mg | DELAYED_RELEASE_TABLET | Freq: Every day | ORAL | Status: DC
  Administered 2015-06-30: 40 mg via ORAL
  Filled 2015-06-29: qty 1

## 2015-06-29 MED ORDER — PROTAMINE SULFATE 10 MG/ML IV SOLN
INTRAVENOUS | Status: DC | PRN
  Administered 2015-06-29: 10 mg via INTRAVENOUS
  Administered 2015-06-29: 280 mg via INTRAVENOUS

## 2015-06-29 MED ORDER — SODIUM CHLORIDE 0.45 % IV SOLN
INTRAVENOUS | Status: DC | PRN
  Administered 2015-06-29: 13:00:00 via INTRAVENOUS

## 2015-06-29 MED ORDER — TRAMADOL HCL 50 MG PO TABS
50.0000 mg | ORAL_TABLET | ORAL | Status: DC | PRN
  Administered 2015-07-01: 50 mg via ORAL
  Filled 2015-06-29: qty 2

## 2015-06-29 MED ORDER — SODIUM CHLORIDE 0.9% FLUSH: 3.0000 mL | INTRAVENOUS | Status: DC | PRN

## 2015-06-29 MED ORDER — ROCURONIUM BROMIDE 50 MG/5ML IV SOLN
INTRAVENOUS | Status: AC
  Filled 2015-06-29: qty 2

## 2015-06-29 MED ORDER — ASPIRIN 81 MG PO CHEW: 324.0000 mg | CHEWABLE_TABLET | Freq: Every day | ORAL | Status: DC

## 2015-06-29 MED ORDER — BISACODYL 10 MG RE SUPP: 10.0000 mg | Freq: Every day | RECTAL | Status: DC

## 2015-06-29 MED ORDER — DEXMEDETOMIDINE HCL IN NACL 200 MCG/50ML IV SOLN: 0.0000 ug/kg/h | INTRAVENOUS | Status: DC

## 2015-06-29 MED ORDER — HEPARIN SODIUM (PORCINE) 1000 UNIT/ML IJ SOLN
INTRAMUSCULAR | Status: DC | PRN
  Administered 2015-06-29: 2000 [IU] via INTRAVENOUS
  Administered 2015-06-29: 27000 [IU] via INTRAVENOUS

## 2015-06-29 MED ORDER — MORPHINE SULFATE (PF) 2 MG/ML IV SOLN
2.0000 mg | INTRAVENOUS | Status: DC | PRN
  Administered 2015-06-29 – 2015-06-30 (×4): 2 mg via INTRAVENOUS
  Administered 2015-06-30 (×2): 4 mg via INTRAVENOUS
  Filled 2015-06-29 (×4): qty 1
  Filled 2015-06-29: qty 2
  Filled 2015-06-29 (×2): qty 1

## 2015-06-29 MED ORDER — METOPROLOL TARTRATE 1 MG/ML IV SOLN: 2.5000 mg | INTRAVENOUS | Status: DC | PRN

## 2015-06-29 MED ORDER — ROCURONIUM BROMIDE 100 MG/10ML IV SOLN
INTRAVENOUS | Status: DC | PRN
  Administered 2015-06-29: 50 mg via INTRAVENOUS

## 2015-06-29 MED ORDER — ESMOLOL HCL 100 MG/10ML IV SOLN
INTRAVENOUS | Status: AC
  Filled 2015-06-29: qty 10

## 2015-06-29 MED ORDER — HEMOSTATIC AGENTS (NO CHARGE) OPTIME
TOPICAL | Status: DC | PRN
  Administered 2015-06-29: 1 via TOPICAL

## 2015-06-29 MED ORDER — SODIUM CHLORIDE 0.9 % IV SOLN: 250.0000 mL | INTRAVENOUS | Status: DC

## 2015-06-29 MED ORDER — LIDOCAINE HCL (CARDIAC) 20 MG/ML IV SOLN
INTRAVENOUS | Status: AC
  Filled 2015-06-29: qty 5

## 2015-06-29 MED ORDER — LACTATED RINGERS IV SOLN: 500.0000 mL | Freq: Once | INTRAVENOUS | Status: DC | PRN

## 2015-06-29 MED ORDER — METOCLOPRAMIDE HCL 5 MG/ML IJ SOLN
10.0000 mg | Freq: Four times a day (QID) | INTRAMUSCULAR | Status: AC
  Administered 2015-06-29 – 2015-06-30 (×4): 10 mg via INTRAVENOUS
  Filled 2015-06-29 (×3): qty 2

## 2015-06-29 MED ORDER — ATORVASTATIN CALCIUM 80 MG PO TABS
80.0000 mg | ORAL_TABLET | Freq: Every day | ORAL | Status: DC
  Administered 2015-06-30 – 2015-07-02 (×3): 80 mg via ORAL
  Filled 2015-06-29 (×3): qty 1

## 2015-06-29 MED ORDER — CHLORHEXIDINE GLUCONATE 0.12 % MT SOLN: 15.0000 mL | Freq: Once | OROMUCOSAL | Status: DC

## 2015-06-29 MED ORDER — VANCOMYCIN HCL IN DEXTROSE 1-5 GM/200ML-% IV SOLN
1000.0000 mg | Freq: Once | INTRAVENOUS | Status: AC
  Administered 2015-06-29: 1000 mg via INTRAVENOUS
  Filled 2015-06-29: qty 200

## 2015-06-29 MED ORDER — PROTAMINE SULFATE 10 MG/ML IV SOLN
INTRAVENOUS | Status: AC
  Filled 2015-06-29: qty 15

## 2015-06-29 MED ORDER — FENTANYL CITRATE (PF) 100 MCG/2ML IJ SOLN
INTRAMUSCULAR | Status: DC | PRN
  Administered 2015-06-29: 150 ug via INTRAVENOUS
  Administered 2015-06-29: 100 ug via INTRAVENOUS
  Administered 2015-06-29: 350 ug via INTRAVENOUS
  Administered 2015-06-29: 250 ug via INTRAVENOUS
  Administered 2015-06-29: 500 ug via INTRAVENOUS
  Administered 2015-06-29: 150 ug via INTRAVENOUS

## 2015-06-29 MED ORDER — ALBUMIN HUMAN 5 % IV SOLN
250.0000 mL | INTRAVENOUS | Status: AC | PRN
  Administered 2015-06-29 (×4): 250 mL via INTRAVENOUS
  Filled 2015-06-29 (×2): qty 250

## 2015-06-29 MED ORDER — SODIUM CHLORIDE 0.9 % IJ SOLN
OROMUCOSAL | Status: DC | PRN
  Administered 2015-06-29 (×3): via TOPICAL

## 2015-06-29 MED FILL — Potassium Chloride Inj 2 mEq/ML: INTRAVENOUS | Qty: 40 | Status: AC

## 2015-06-29 MED FILL — Magnesium Sulfate Inj 50%: INTRAMUSCULAR | Qty: 10 | Status: AC

## 2015-06-29 MED FILL — Heparin Sodium (Porcine) Inj 1000 Unit/ML: INTRAMUSCULAR | Qty: 30 | Status: AC

## 2015-06-29 SURGICAL SUPPLY — 81 items
BAG DECANTER FOR FLEXI CONT (MISCELLANEOUS) ×4 IMPLANT
BANDAGE ELASTIC 4 VELCRO ST LF (GAUZE/BANDAGES/DRESSINGS) ×4 IMPLANT
BANDAGE ELASTIC 6 VELCRO ST LF (GAUZE/BANDAGES/DRESSINGS) ×4 IMPLANT
BASKET HEART  (ORDER IN 25'S) (MISCELLANEOUS) ×1
BASKET HEART (ORDER IN 25'S) (MISCELLANEOUS) ×1
BASKET HEART (ORDER IN 25S) (MISCELLANEOUS) ×2 IMPLANT
BLADE STERNUM SYSTEM 6 (BLADE) ×4 IMPLANT
BNDG GAUZE ELAST 4 BULKY (GAUZE/BANDAGES/DRESSINGS) ×4 IMPLANT
CANISTER SUCTION 2500CC (MISCELLANEOUS) ×4 IMPLANT
CANNULA EZ GLIDE AORTIC 21FR (CANNULA) ×4 IMPLANT
CATH CPB KIT HENDRICKSON (MISCELLANEOUS) ×4 IMPLANT
CATH ROBINSON RED A/P 18FR (CATHETERS) ×4 IMPLANT
CATH THORACIC 36FR (CATHETERS) ×4 IMPLANT
CATH THORACIC 36FR RT ANG (CATHETERS) ×4 IMPLANT
CLIP TI MEDIUM 24 (CLIP) IMPLANT
CLIP TI WIDE RED SMALL 24 (CLIP) ×4 IMPLANT
CRADLE DONUT ADULT HEAD (MISCELLANEOUS) ×4 IMPLANT
DERMABOND ADVANCED (GAUZE/BANDAGES/DRESSINGS) ×2
DERMABOND ADVANCED .7 DNX12 (GAUZE/BANDAGES/DRESSINGS) ×2 IMPLANT
DRAPE CARDIOVASCULAR INCISE (DRAPES) ×2
DRAPE SLUSH/WARMER DISC (DRAPES) ×4 IMPLANT
DRAPE SRG 135X102X78XABS (DRAPES) ×2 IMPLANT
DRSG COVADERM 4X14 (GAUZE/BANDAGES/DRESSINGS) ×4 IMPLANT
ELECT REM PT RETURN 9FT ADLT (ELECTROSURGICAL) ×8
ELECTRODE REM PT RTRN 9FT ADLT (ELECTROSURGICAL) ×4 IMPLANT
FELT TEFLON 1X6 (MISCELLANEOUS) ×4 IMPLANT
GAUZE SPONGE 4X4 12PLY STRL (GAUZE/BANDAGES/DRESSINGS) ×8 IMPLANT
GLOVE SURG SIGNA 7.5 PF LTX (GLOVE) ×12 IMPLANT
GOWN STRL REUS W/ TWL LRG LVL3 (GOWN DISPOSABLE) ×8 IMPLANT
GOWN STRL REUS W/ TWL XL LVL3 (GOWN DISPOSABLE) ×4 IMPLANT
GOWN STRL REUS W/TWL LRG LVL3 (GOWN DISPOSABLE) ×8
GOWN STRL REUS W/TWL XL LVL3 (GOWN DISPOSABLE) ×4
HEMOSTAT POWDER SURGIFOAM 1G (HEMOSTASIS) ×12 IMPLANT
HEMOSTAT SURGICEL 2X14 (HEMOSTASIS) ×4 IMPLANT
INSERT FOGARTY XLG (MISCELLANEOUS) IMPLANT
KIT BASIN OR (CUSTOM PROCEDURE TRAY) ×4 IMPLANT
KIT ROOM TURNOVER OR (KITS) ×4 IMPLANT
KIT SUCTION CATH 14FR (SUCTIONS) ×8 IMPLANT
KIT VASOVIEW HEMOPRO VH 3000 (KITS) ×4 IMPLANT
MARKER GRAFT CORONARY BYPASS (MISCELLANEOUS) ×12 IMPLANT
NS IRRIG 1000ML POUR BTL (IV SOLUTION) ×20 IMPLANT
PACK OPEN HEART (CUSTOM PROCEDURE TRAY) ×4 IMPLANT
PAD ARMBOARD 7.5X6 YLW CONV (MISCELLANEOUS) ×8 IMPLANT
PAD ELECT DEFIB RADIOL ZOLL (MISCELLANEOUS) ×4 IMPLANT
PENCIL BUTTON HOLSTER BLD 10FT (ELECTRODE) ×4 IMPLANT
PUNCH AORTIC ROTATE 4.0MM (MISCELLANEOUS) ×4 IMPLANT
PUNCH AORTIC ROTATE 4.5MM 8IN (MISCELLANEOUS) IMPLANT
PUNCH AORTIC ROTATE 5MM 8IN (MISCELLANEOUS) ×4 IMPLANT
SET CARDIOPLEGIA MPS 5001102 (MISCELLANEOUS) ×4 IMPLANT
SPONGE GAUZE 4X4 12PLY STER LF (GAUZE/BANDAGES/DRESSINGS) ×8 IMPLANT
SUT BONE WAX W31G (SUTURE) ×4 IMPLANT
SUT MNCRL AB 4-0 PS2 18 (SUTURE) ×4 IMPLANT
SUT PROLENE 3 0 SH DA (SUTURE) ×4 IMPLANT
SUT PROLENE 4 0 RB 1 (SUTURE) ×2
SUT PROLENE 4 0 SH DA (SUTURE) IMPLANT
SUT PROLENE 4-0 RB1 .5 CRCL 36 (SUTURE) ×2 IMPLANT
SUT PROLENE 6 0 C 1 30 (SUTURE) ×16 IMPLANT
SUT PROLENE 7 0 BV1 MDA (SUTURE) ×4 IMPLANT
SUT PROLENE 8 0 BV175 6 (SUTURE) IMPLANT
SUT STEEL 6MS V (SUTURE) ×4 IMPLANT
SUT STEEL STERNAL CCS#1 18IN (SUTURE) IMPLANT
SUT STEEL SZ 6 DBL 3X14 BALL (SUTURE) ×4 IMPLANT
SUT VIC AB 1 CTX 36 (SUTURE) ×4
SUT VIC AB 1 CTX36XBRD ANBCTR (SUTURE) ×4 IMPLANT
SUT VIC AB 2-0 CT1 27 (SUTURE) ×2
SUT VIC AB 2-0 CT1 TAPERPNT 27 (SUTURE) ×2 IMPLANT
SUT VIC AB 2-0 CTX 27 (SUTURE) IMPLANT
SUT VIC AB 3-0 SH 27 (SUTURE)
SUT VIC AB 3-0 SH 27X BRD (SUTURE) IMPLANT
SUT VIC AB 3-0 X1 27 (SUTURE) IMPLANT
SUT VICRYL 4-0 PS2 18IN ABS (SUTURE) IMPLANT
SUTURE E-PAK OPEN HEART (SUTURE) ×4 IMPLANT
SYSTEM SAHARA CHEST DRAIN ATS (WOUND CARE) ×4 IMPLANT
TAPE CLOTH SURG 4X10 WHT LF (GAUZE/BANDAGES/DRESSINGS) ×8 IMPLANT
TOWEL OR 17X24 6PK STRL BLUE (TOWEL DISPOSABLE) ×8 IMPLANT
TOWEL OR 17X26 10 PK STRL BLUE (TOWEL DISPOSABLE) ×8 IMPLANT
TRAY FOLEY IC TEMP SENS 16FR (CATHETERS) ×4 IMPLANT
TUBE FEEDING 8FR 16IN STR KANG (MISCELLANEOUS) ×4 IMPLANT
TUBING INSUFFLATION (TUBING) ×4 IMPLANT
UNDERPAD 30X30 INCONTINENT (UNDERPADS AND DIAPERS) ×4 IMPLANT
WATER STERILE IRR 1000ML POUR (IV SOLUTION) ×8 IMPLANT

## 2015-06-29 NOTE — Anesthesia Postprocedure Evaluation (Addendum)
Anesthesia Post Note  Patient: Andre CairoLarry Pugh  Procedure(s) Performed: Procedure(s) (LRB): CORONARY ARTERY BYPASS GRAFTING (CABG) TIMES THREE USING LEFT INTERNAL MAMMARY ARTERY AND RIGHT GREATER SAPHENOUS VEIN HARVESTED BY ENDOVEIN (N/A) TRANSESOPHAGEAL ECHOCARDIOGRAM (TEE) (N/A)  Patient location during evaluation: ICU Anesthesia Type: General Level of consciousness: sedated Pain management: pain level controlled Vital Signs Assessment: post-procedure vital signs reviewed and stable Respiratory status: patient remains intubated per anesthesia plan Cardiovascular status: stable Postop Assessment: no signs of nausea or vomiting Anesthetic complications: no    Last Vitals:  Filed Vitals:   06/29/15 1545 06/29/15 1600  BP: 107/76 104/70  Pulse: 70 70  Temp: 36.8 C 37 C  Resp: 15 17    Last Pain: There were no vitals filed for this visit.               Kensy Blizard

## 2015-06-29 NOTE — Progress Notes (Signed)
[facility] BRIEF SICU PROGRESS NOTE  Day of Surgery  S/P Procedure(s) (LRB): CORONARY ARTERY BYPASS GRAFTING (CABG) TIMES THREE USING LEFT INTERNAL MAMMARY ARTERY AND RIGHT GREATER SAPHENOUS VEIN HARVESTED BY ENDOVEIN (N/A) TRANSESOPHAGEAL ECHOCARDIOGRAM (TEE) (N/A)   Doing well Extubated uneventfully NSR w/ stable hemodynamics, no drips O2 sats  100% on 3 L/min via [place] Chest tube output low UOP adequate Labs okay  Plan: Continue routine early postop  Purcell Nailslarence H Doneisha Ivey, MD 06/29/2015 6:47 PM

## 2015-06-29 NOTE — Anesthesia Preprocedure Evaluation (Signed)
Anesthesia Evaluation  Patient identified by MRN, date of birth, ID band Patient awake    Reviewed: Allergy & Precautions, NPO status , Patient's Chart, lab work & pertinent test results, reviewed documented beta blocker date and time   History of Anesthesia Complications Negative for: history of anesthetic complications  Airway Mallampati: II  TM Distance: >3 FB     Dental no notable dental hx.    Pulmonary COPD, Current Smoker,    breath sounds clear to auscultation       Cardiovascular hypertension, Pt. on medications and Pt. on home beta blockers + CAD, + Past MI and + Peripheral Vascular Disease   Rhythm:Regular     Neuro/Psych PSYCHIATRIC DISORDERS Schizophrenia negative neurological ROS     GI/Hepatic Neg liver ROS, GERD  Medicated and Controlled,  Endo/Other  diabetes  Renal/GU negative Renal ROS     Musculoskeletal  (+) Arthritis ,   Abdominal   Peds  Hematology  (+) anemia ,   Anesthesia Other Findings   Reproductive/Obstetrics                             Anesthesia Physical Anesthesia Plan  ASA: IV  Anesthesia Plan: General   Post-op Pain Management:    Induction: Intravenous  Airway Management Planned: Oral ETT  Additional Equipment: Arterial line, TEE, Ultrasound Guidance Line Placement, PA Cath and CVP  Intra-op Plan:   Post-operative Plan: Post-operative intubation/ventilation  Informed Consent: I have reviewed the patients History and Physical, chart, labs and discussed the procedure including the risks, benefits and alternatives for the proposed anesthesia with the patient or authorized representative who has indicated his/her understanding and acceptance.   Dental advisory given  Plan Discussed with: CRNA and Surgeon  Anesthesia Plan Comments:         Anesthesia Quick Evaluation

## 2015-06-29 NOTE — Anesthesia Procedure Notes (Addendum)
Central Venous Catheter Insertion Performed by: anesthesiologist Patient location: Pre-op. Preanesthetic checklist: patient identified, IV checked, site marked, risks and benefits discussed, surgical consent, monitors and equipment checked, pre-op evaluation, timeout performed and anesthesia consent Position: Trendelenburg Lidocaine 1% used for infiltration Landmarks identified and Seldinger technique used Catheter size: 9 Fr Central line and PA cath was placed.MAC introducer Swan type and PA catheter depth:thermodilation and 50PA Cath depth:50 Procedure performed using ultrasound guided technique. Attempts: 1 Following insertion, line sutured and dressing applied. Post procedure assessment: blood return through all ports, free fluid flow and no air. Patient tolerated the procedure well with no immediate complications.   Procedure Name: Intubation Date/Time: 06/29/2015 7:57 AM Performed by: Wray KearnsFOLEY, Haruka Kowaleski A Pre-anesthesia Checklist: Patient identified, Timeout performed, Emergency Drugs available, Suction available and Patient being monitored Patient Re-evaluated:Patient Re-evaluated prior to inductionOxygen Delivery Method: Circle system utilized Preoxygenation: Pre-oxygenation with 100% oxygen Intubation Type: IV induction and Cricoid Pressure applied Ventilation: Mask ventilation without difficulty and Oral airway inserted - appropriate to patient size Laryngoscope Size: Mac and 4 Grade View: Grade I Tube type: Oral Tube size: 8.0 mm Number of attempts: 1 Airway Equipment and Method: Stylet Placement Confirmation: ETT inserted through vocal cords under direct vision,  breath sounds checked- equal and bilateral and positive ETCO2 Secured at: 23 cm Tube secured with: Tape Dental Injury: Teeth and Oropharynx as per pre-operative assessment

## 2015-06-29 NOTE — Procedures (Signed)
Extubation Procedure Note  Patient Details:   Name: Andre Rojas DOB: 01/26/1951 MRN: [id number]   Airway Documentation:     Evaluation  O2 sats: stable throughout Complications: No apparent complications Patient did tolerate procedure well. Bilateral Breath Sounds: Clear, Diminished   Yes  NIF-20 [id number] Placed on 4l/min [place], and instructed incentive spirometer.  Newt LukesGroendal, Vitalia Stough Ann 06/29/2015, 4:46 PM

## 2015-06-29 NOTE — Transfer of Care (Signed)
Immediate Anesthesia Transfer of Care Note  Patient: Andre Rojas  Procedure(s) Performed: Procedure(s): CORONARY ARTERY BYPASS GRAFTING (CABG) TIMES THREE USING LEFT INTERNAL MAMMARY ARTERY AND RIGHT GREATER SAPHENOUS VEIN HARVESTED BY ENDOVEIN (N/A) TRANSESOPHAGEAL ECHOCARDIOGRAM (TEE) (N/A)  Patient Location: SICU  Anesthesia Type:General  Level of Consciousness: Patient remains intubated per anesthesia plan  Airway & Oxygen Therapy: Patient remains intubated per anesthesia plan and Patient placed on Ventilator (see vital sign flow sheet for setting)  Post-op Assessment: Report given to RN and Post -op Vital signs reviewed and stable  Post vital signs: Reviewed and stable  Last Vitals:  Filed Vitals:   06/29/15 0556 06/29/15 0649  BP: 155/73   Pulse: 52 56  Temp: 36.5 C   Resp: 20     Complications: No apparent anesthesia complications

## 2015-06-29 NOTE — Addendum Note (Signed)
Addendum  created 06/29/15 1628 by Val Eaglehristopher Mizuki Hoel, MD   Modules edited: Clinical Notes   Clinical Notes:  File: [id number]

## 2015-06-29 NOTE — Progress Notes (Signed)
EKG and CXR received.  A1C from 4/19 6.1. A1C received from doctor Horald PollenBalen was 7.3 on February 17

## 2015-06-29 NOTE — H&P (View-Only) (Signed)
PCP is No primary care provider on file. Referring Provider is Andre Plum, MD  Chief Complaint  Patient presents with  . Coronary Artery Disease    Surgical eval,     HPI:65 year old man sent for consultation regarding severe two-vessel coronary disease.  Andre Rojas is a 65 year old gentleman who is a poor historian. He recently was admitted to the hospital in [place] with chest pain (06/02/2015). He says he was in the hospital 4 days. He was told that he had coronary artery disease with one-vessel 100% blocked and the other 99% blocked and that he needed immediate surgery. They then found out that he had [facility] and he was discharged from the hospital. His daughter brought him back to [place]. He saw Dr. Julio Rojas and was referred to me for coronary bypass grafting.  He has continued to have some mild chest tightness with exertion. It has not been as severe as the episode was in [place]. He says it and never last more than a couple of minutes and resolves when he rests. He does get short of breath with the chest tightness. He also complains of reflux, but says he can tell the difference between that and his "heart pain." He has been living on the street sleeping in a sleeping bag since he return to [place]. His daughter lives in [place], and is willing to take him in once he is safe to travel. He has a son in the [place], but has not seen him since he returned.   Past Medical History  Diagnosis Date  . Acid reflux   . Chronic back pain   . Chest pain   . Diabetes (HCC)   . Erectile dysfunction   . Hyperlipidemia   . CAD (coronary artery disease)   Type 2 diabetes with peripheral neuropathy Gastroesophageal reflux without esophagitis Peripheral vascular disease Chronic pain Tobacco abuse  Past Surgical History  Procedure Laterality Date  . Knee surgery      Family History  Problem Relation Age of Onset  . Cancer Mother      stomach  . Heart disease Father     Social History Social History  Substance Use Topics  . Smoking status: Current Every Day Smoker -- 0.50 packs/day for 40 years    Types: Cigarettes  . Smokeless tobacco: None  . Alcohol Use: Yes     Comment: 1/5 per day     Current Outpatient Prescriptions  Medication Sig Dispense Refill  . aspirin 325 MG tablet Take 325 mg by mouth daily.    Marland Kitchen atorvastatin (LIPITOR) 80 MG tablet Take 80 mg by mouth daily at 6 PM.    . lisinopril (PRINIVIL,ZESTRIL) 5 MG tablet Take 5 mg by mouth daily.    . metoprolol succinate (TOPROL-XL) 25 MG 24 hr tablet Take 12.5 mg by mouth 2 (two) times daily.     No current facility-administered medications for this visit.    No Known Allergies  Review of Systems  Constitutional: Positive for activity change and fatigue. Negative for fever, chills, appetite change and unexpected weight change.  HENT: Negative for trouble swallowing and voice change.   Eyes: Negative for visual disturbance.  Respiratory: Positive for shortness of breath. Negative for wheezing.   Cardiovascular: Positive for chest pain. Negative for leg swelling.       Right leg "stays cold all the time"  Gastrointestinal: Positive for abdominal pain (reflux). Negative for blood in stool.  Genitourinary: Positive for frequency and  difficulty urinating.       Erectile dysfunction  Neurological:       Paresthesias both feet  Hematological: Negative for adenopathy. Does not bruise/bleed easily.  Psychiatric/Behavioral:       Sleep disturbance, hx psychosis  All other systems reviewed and are negative.   BP 93/70 mmHg  Pulse 62  Resp 20  Ht 5\' 9"  (1.753 m)  Wt 196 lb (88.905 kg)  BMI 28.93 kg/m2  SpO2 99% Physical Exam  Constitutional: He is oriented to person, place, and time. He appears well-developed and well-nourished. No distress.  HENT:  Head: Normocephalic and atraumatic.  Eyes: Conjunctivae and EOM are normal. No scleral icterus.   Neck: Neck supple. No thyromegaly present.  Cardiovascular: Normal rate, regular rhythm and normal heart sounds.  Exam reveals no gallop and no friction rub.   No murmur heard. Pulses:      Dorsalis pedis pulses are 0 on the right side, and 2+ on the left side.       Posterior tibial pulses are 0 on the right side, and 1+ on the left side.  Pulmonary/Chest: Effort normal and breath sounds normal. He has no wheezes. He has no rales.  Abdominal: Bowel sounds are normal. He exhibits no distension. There is no tenderness.  Musculoskeletal: He exhibits no edema.  Lymphadenopathy:    He has no cervical adenopathy.  Neurological: He is alert and oriented to person, place, and time. No cranial nerve deficit. He exhibits normal muscle tone.  Skin: Skin is warm and dry.  Vitals reviewed.    Diagnostic Tests: I personally reviewed the cardiac catheter[facility] [place] done in March 2017. I do not have the official report. It shows a totally occluded right coronary and a tight LAD lesion  Impression: 65 year old man with multiple cardiac risk factors who had a non-ST elevation MI about 3 weeks ago in [place]. Cardiac catheterization showed a totally occluded right coronary and a tight LAD stenosis. He does not have any major stenoses in his ramus or circumflex. He does have some disease in a small diagonal, but I do not feel that vessel is graftable.  He needs coronary artery bypass grafting for survival benefit and relief symptoms in the setting of severe two-vessel disease.   I discussed the general nature of the procedure, need for general anesthesia,and incisions to be used with Andre Rojas and his daughter Andre Rojas (by telephone). I have discussed the expected hospital stay, overall recovery and short and long term outcomes. I reviewed the indications, risks, benefits, and alternatives. They understand the risks include, but are not limited to  death, stroke, MI, DVT/PE, bleeding, possible need for transfusion, infections, cardiac arrhythmias, and other organ system dysfunction including respiratory, renal, or GI complications.   He accepts the risk and agrees to proceed.  I did inform them that he would need some place to stay in [place] for a couple of weeks before he is safe to travel back to [place]. I will see if I can have social work contact his daughter and the patient to see what options may be available. Jannifer Hicknita Haynes' phone number is [phone].  Plan: CABG Friday 06/29/15  Loreli SlotSteven C Marrissa Dai, MD [facility] [phone]

## 2015-06-29 NOTE — Interval H&P Note (Signed)
History and Physical Interval Note:  06/29/2015 7:16 AM  Andre CairoLarry Rojas  has presented today for surgery, with the diagnosis of CAD  The various methods of treatment have been discussed with the patient and family. After consideration of risks, benefits and other options for treatment, the patient has consented to  Procedure(s): CORONARY ARTERY BYPASS GRAFTING (CABG) (N/A) TRANSESOPHAGEAL ECHOCARDIOGRAM (TEE) (N/A) as a surgical intervention .  The patient's history has been reviewed, patient examined, no change in status, stable for surgery.  I have reviewed the patient's chart and labs.  Questions were answered to the patient's satisfaction.     Loreli SlotSteven C Artie Mcintyre

## 2015-06-29 NOTE — Brief Op Note (Addendum)
06/29/2015  10:29 AM  PATIENT:  Andre Rojas  65 y.o. male  PRE-OPERATIVE DIAGNOSIS:  CAD  POST-OPERATIVE DIAGNOSIS:  CAD  PROCEDURE:  Procedure(s):  CORONARY ARTERY BYPASS GRAFTING x 3 -LIMA to LAD -SVG to RAMUS INTERMEDIATE -SVG to PLA  ENDOSCOPIC HARVEST GREATER SAPHENOUS VEIN -Right Thigh  TRANSESOPHAGEAL ECHOCARDIOGRAM (TEE) (N/A)  SURGEON:  Surgeon(s) and Role:    * Loreli SlotSteven C Westin Knotts, MD - Primary  PHYSICIAN ASSISTANT: Erin Barrett PA-C  ANESTHESIA:   general  EBL:  Total I/O In: 1300 [I.V.:1300] Out: 300 [Urine:300]  BLOOD ADMINISTERED: CELLSAVER  DRAINS: Left Pleural Chest Tube, Mediastinal Chest Drain   LOCAL MEDICATIONS USED:  NONE  SPECIMEN:  No Specimen  DISPOSITION OF SPECIMEN:  N/A  COUNTS:  YES  PLAN OF CARE: Admit to inpatient   PATIENT DISPOSITION:  ICU - intubated and hemodynamically stable.   Delay start of Pharmacological VTE agent (>24hrs) due to surgical blood loss or risk of bleeding: yes  XC= 53 min CPB= 78 min Good quality target and conduits TEE- LVH with preserved LV function, no significant valvular pathology

## 2015-06-30 ENCOUNTER — Inpatient Hospital Stay (HOSPITAL_COMMUNITY): Payer: Medicaid Other

## 2015-06-30 LAB — GLUCOSE, CAPILLARY
GLUCOSE-CAPILLARY: 112 mg/dL — AB (ref 65–99)
GLUCOSE-CAPILLARY: 112 mg/dL — AB (ref 65–99)
GLUCOSE-CAPILLARY: 113 mg/dL — AB (ref 65–99)
GLUCOSE-CAPILLARY: 114 mg/dL — AB (ref 65–99)
GLUCOSE-CAPILLARY: 114 mg/dL — AB (ref 65–99)
GLUCOSE-CAPILLARY: 122 mg/dL — AB (ref 65–99)
GLUCOSE-CAPILLARY: 123 mg/dL — AB (ref 65–99)
GLUCOSE-CAPILLARY: 133 mg/dL — AB (ref 65–99)
GLUCOSE-CAPILLARY: 139 mg/dL — AB (ref 65–99)
GLUCOSE-CAPILLARY: 155 mg/dL — AB (ref 65–99)
Glucose-Capillary: 142 mg/dL — ABNORMAL HIGH (ref 65–99)
Glucose-Capillary: 96 mg/dL (ref 65–99)
Glucose-Capillary: 101 mg/dL — ABNORMAL HIGH (ref 65–99)
Glucose-Capillary: 101 mg/dL — ABNORMAL HIGH (ref 65–99)
Glucose-Capillary: 105 mg/dL — ABNORMAL HIGH (ref 65–99)
Glucose-Capillary: 137 mg/dL — ABNORMAL HIGH (ref 65–99)
Glucose-Capillary: 61 mg/dL — ABNORMAL LOW (ref 65–99)
Glucose-Capillary: 99 mg/dL (ref 65–99)

## 2015-06-30 LAB — CBC
HCT: 28.1 % — ABNORMAL LOW (ref 39.0–52.0)
HEMATOCRIT: 30.9 % — AB (ref 39.0–52.0)
HEMOGLOBIN: 9.5 g/dL — AB (ref 13.0–17.0)
Hemoglobin: 8.6 g/dL — ABNORMAL LOW (ref 13.0–17.0)
MCH: 24.9 pg — ABNORMAL LOW (ref 26.0–34.0)
MCH: 24.8 pg — ABNORMAL LOW (ref 26.0–34.0)
MCHC: 30.6 g/dL (ref 30.0–36.0)
MCHC: 30.7 g/dL (ref 30.0–36.0)
MCV: 81.4 fL (ref 78.0–100.0)
MCV: 80.7 fL (ref 78.0–100.0)
PLATELETS: 148 10*3/uL — AB (ref 150–400)
Platelets: 184 10*3/uL (ref 150–400)
RBC: 3.45 MIL/uL — AB (ref 4.22–5.81)
RBC: 3.83 MIL/uL — ABNORMAL LOW (ref 4.22–5.81)
RDW: 15.1 % (ref 11.5–15.5)
RDW: 15.1 % (ref 11.5–15.5)
WBC: 13 10*3/uL — AB (ref 4.0–10.5)
WBC: 16.4 10*3/uL — AB (ref 4.0–10.5)

## 2015-06-30 LAB — MAGNESIUM
MAGNESIUM: 2.3 mg/dL (ref 1.7–2.4)
MAGNESIUM: 1.9 mg/dL (ref 1.7–2.4)

## 2015-06-30 LAB — POCT I-STAT 3, ART BLOOD GAS (G3+)
ACID-BASE DEFICIT: 1 mmol/L (ref 0.0–2.0)
Bicarbonate: 24.1 mEq/L — ABNORMAL HIGH (ref 20.0–24.0)
O2 SAT: 96 %
PCO2 ART: 44.7 mmHg (ref 35.0–45.0)
Patient temperature: 37.8
TCO2: 25 mmol/L (ref 0–100)
pH, Arterial: 7.343 — ABNORMAL LOW (ref 7.350–7.450)
pO2, Arterial: 90 mmHg (ref 80.0–100.0)

## 2015-06-30 LAB — CREATININE, SERUM: Creatinine, Ser: 0.8 mg/dL (ref 0.61–1.24)

## 2015-06-30 LAB — BASIC METABOLIC PANEL
ANION GAP: 7 (ref 5–15)
BUN: 7 mg/dL (ref 6–20)
CO2: 24 mmol/L (ref 22–32)
Calcium: 8.2 mg/dL — ABNORMAL LOW (ref 8.9–10.3)
Chloride: 110 mmol/L (ref 101–111)
Creatinine, Ser: 0.71 mg/dL (ref 0.61–1.24)
Glucose, Bld: 103 mg/dL — ABNORMAL HIGH (ref 65–99)
POTASSIUM: 4.3 mmol/L (ref 3.5–5.1)
SODIUM: 141 mmol/L (ref 135–145)

## 2015-06-30 LAB — POCT I-STAT, CHEM 8
BUN: 6 mg/dL (ref 6–20)
CALCIUM ION: 1.17 mmol/L (ref 1.13–1.30)
CHLORIDE: 97 mmol/L — AB (ref 101–111)
CREATININE: 0.8 mg/dL (ref 0.61–1.24)
GLUCOSE: 118 mg/dL — AB (ref 65–99)
HEMATOCRIT: 31 % — AB (ref 39.0–52.0)
HEMOGLOBIN: 10.5 g/dL — AB (ref 13.0–17.0)
POTASSIUM: 3.6 mmol/L (ref 3.5–5.1)
SODIUM: 138 mmol/L (ref 135–145)
TCO2: 26 mmol/L (ref 0–100)

## 2015-06-30 MED ORDER — MORPHINE SULFATE (PF) 2 MG/ML IV SOLN
1.0000 mg | INTRAVENOUS | Status: DC | PRN
  Administered 2015-06-30: 2 mg via INTRAVENOUS
  Filled 2015-06-30: qty 1

## 2015-06-30 MED ORDER — ALBUMIN HUMAN 5 % IV SOLN
INTRAVENOUS | Status: AC
Start: 2015-06-30 — End: 2015-06-30
  Administered 2015-06-30: 12.5 g
  Filled 2015-06-30: qty 250

## 2015-06-30 MED ORDER — NICOTINE 14 MG/24HR TD PT24
14.0000 mg | MEDICATED_PATCH | Freq: Every day | TRANSDERMAL | Status: DC
  Administered 2015-06-30 – 2015-07-03 (×4): 14 mg via TRANSDERMAL
  Filled 2015-06-30 (×3): qty 1

## 2015-06-30 MED ORDER — POTASSIUM CHLORIDE 10 MEQ/50ML IV SOLN
10.0000 meq | INTRAVENOUS | Status: AC
  Administered 2015-06-30 (×3): 10 meq via INTRAVENOUS

## 2015-06-30 MED ORDER — FUROSEMIDE 10 MG/ML IJ SOLN
20.0000 mg | Freq: Four times a day (QID) | INTRAMUSCULAR | Status: AC
  Administered 2015-06-30 (×3): 20 mg via INTRAVENOUS
  Filled 2015-06-30 (×2): qty 2

## 2015-06-30 MED ORDER — INSULIN ASPART 100 UNIT/ML ~~LOC~~ SOLN
0.0000 [IU] | SUBCUTANEOUS | Status: DC
  Administered 2015-06-30 (×3): 2 [IU] via SUBCUTANEOUS

## 2015-06-30 MED ORDER — TRAZODONE HCL 100 MG PO TABS
100.0000 mg | ORAL_TABLET | Freq: Every day | ORAL | Status: DC
  Administered 2015-06-30 – 2015-07-02 (×3): 100 mg via ORAL
  Filled 2015-06-30 (×3): qty 1

## 2015-06-30 MED ORDER — INSULIN DETEMIR 100 UNIT/ML ~~LOC~~ SOLN
20.0000 [IU] | Freq: Once | SUBCUTANEOUS | Status: AC
  Administered 2015-06-30: 20 [IU] via SUBCUTANEOUS
  Filled 2015-06-30: qty 0.2

## 2015-06-30 NOTE — Progress Notes (Signed)
[facility] BRIEF SICU PROGRESS NOTE  1 Day Post-Op  S/P Procedure(s) (LRB): CORONARY ARTERY BYPASS GRAFTING (CABG) TIMES THREE USING LEFT INTERNAL MAMMARY ARTERY AND RIGHT GREATER SAPHENOUS VEIN HARVESTED BY ENDOVEIN (N/A) TRANSESOPHAGEAL ECHOCARDIOGRAM (TEE) (N/A)   Doing well Patient requests trazodone at bedtime - states that he takes it at home (not listed in EPIC) NSR w/ stable BP UOP adequate  Plan: Continue current plan  Purcell Nailslarence H Owen, MD 06/30/2015 6:45 PM

## 2015-06-30 NOTE — Op Note (Signed)
NAMEDREY, SHAFF NO.:  [id number]  MEDICAL RECORD NO.:  [id number]  LOCATION:  2S08C                        FACILITY:  [facility]  PHYSICIAN:  Salvatore Decent. Dorris Fetch, M.D.DATE OF BIRTH:  10-07-1950  DATE OF PROCEDURE:  06/29/2015 DATE OF DISCHARGE:                              OPERATIVE REPORT   PREOPERATIVE DIAGNOSIS:  Left main and 2-vessel coronary disease status post recent non ST elevation myocardial infarction.  POSTOPERATIVE DIAGNOSIS:  Left main and 2-vessel coronary disease status post recent non ST elevation myocardial infarction.  PROCEDURE:  Median sternotomy, extracorporeal circulation Coronary artery bypass grafting x 4   Left internal mammary artery to left anterior descending,  Saphenous vein graft to ramus intermedius,  Ssaphenous vein graft to posterolateral branch of right coronary, Endoscopic vein harvest right thigh.  SURGEON:  Salvatore Decent. Dorris Fetch, MD  ASSISTANT:  Lowella Dandy, PA-C  ANESTHESIA:  General.  FINDINGS:  Transesophageal echocardiography showed left ventricular hypertrophy with preserved left ventricular systolic function and no valvular pathology.  Good quality conduits, good quality targets.  CLINICAL NOTE:  Mr. Korinek is a 65 year old man who recently suffered a non ST-elevation MI.  He was seen and treated in [place], [place]. Catheterization was performed which showed a total occlusion of his right coronary and a tight LAD stenosis.  There also was moderate left main disease.  He was discharged from the hospital and was brought back to [facility].  He was seen by Dr. Julio Sicks and referred for coronary bypass grafting.  He was advised to have coronary bypass grafting.  The indications, risks, benefits, and alternatives were discussed in detail with the patient.  He understood and accepted the risks and agreed to proceed.  OPERATIVE NOTE:  Mr. Bogdon was brought to the preoperative holding area on June 29, 2015.  Anesthesia placed a Swan-Ganz catheter and arterial blood pressure monitoring line.  He was taken to the operating room, anesthetized, and intubated.  Intravenous antibiotics were administered. A Foley catheter was placed.  The chest, abdomen, and legs were prepped and draped in usual sterile fashion.  Transesophageal echocardiography was performed.  Findings were as previously noted.  After performing a time-out, a median sternotomy was performed and the left internal mammary artery was harvested using standard technique. Simultaneously, an incision was made in the medial aspect of the right leg and the greater saphenous vein was harvested from the right thigh endoscopically.  Both the mammary and saphenous vein were good quality vessels.  2000 units of heparin was administered during the vessel harvest.  Remainder of full heparin dose was administered prior to opening the pericardium.  After harvesting the conduits, the pericardium was opened.  The ascending aorta was inspected.  There was no significant atherosclerotic disease.  The aorta was cannulated via concentric 2-0 Ethibond pledgeted pursestring sutures.  A dual-stage venous cannula was placed via a pursestring suture in right atrial appendage.  Cardiopulmonary bypass was initiated.  Flows were maintained per protocol.  The patient was cooled to 32 degrees Celsius.  The coronary arteries were inspected and anastomotic sites were chosen.  The conduits were inspected and cut to length.  A foam pad was placed  in the pericardium to insulate the heart and protect the left phrenic nerve.  A temperature probe was placed in myocardial septum and a cardioplegia cannula was placed in the ascending aorta.  The aorta was crossclamped.  The left ventricle was entered via the aortic root vent.  Cardiac arrest then was achieved with combination of cold antegrade blood cardioplegia and topical iced saline.  After achieving a complete  diastolic arrest and adequate myocardial septal cooling to 12 degrees Celsius, the following distal anastomoses were performed.  First, a reversed saphenous vein graft was placed end-to-side to the posterior lateral branch of the right coronary.  This was the only graftable branch of that vessel.  It was a 1.5 mm good quality target.  The vein was anastomosed end-to-side with a running 7-0 Prolene suture.  All anastomoses were probed proximally and distally at their completion to ensure patency.  Cardioplegia was administered down the vein and there was good flow and good hemostasis.  Next, a reversed saphenous vein graft was placed end-to-side to the ramus intermedius.  This vessel was grafted due to th calcified plaque in the distal portion of the left main.  The ramus did have some plaque, but was a good quality vessel at the site of anastomosis. It was 2 mm in diameter.  The vein was anastomosed end-to-side with a running 7- 0 Prolene suture.  Again, there was good flow and good hemostasis with cardioplegia administration.  The left internal mammary artery was brought through a window in the pericardium.  The distal end was beveled.  It was anastomosed end-to- side to the distal LAD.  The LAD was a 2 mm good quality target.  There was some plaque proximal and distal to the anastomosis, but a probe passed easily in both directions.  The anastomosis was performed with a running 8-0 Prolene suture.  At the completion of the mammary to LAD anastomosis, the bulldog clamp was briefly removed.  Rapid septal rewarming was noted.  The bulldog clamp was replaced.  The mammary pedicle was tacked to the epicardial surface of the heart with 6-0 Prolene sutures.  Rewarming was begun.  Additional cardioplegia was administered down the vein grafts and aortic root.  The vein grafts were cut to length.  The cardioplegia cannula was removed from the ascending aorta and the proximal vein graft  anastomoses were performed to 4.5 mm punch aortotomies with running 6-0 Prolene suture.  At completion of final proximal anastomosis, the patient was placed in Trendelenburg position. Lidocaine was administered.  The aortic root was de-aired.  The bulldog clamp was again removed from the left mammary artery.  After de-airing the aortic root, the aortic crossclamp was removed.  Total crossclamp time was 53 minutes.  The patient required single defibrillation with 10 joules and then was in sinus rhythm thereafter.  While rewarming was completed, all proximal and distal anastomoses were inspected for hemostasis.  Epicardial pacing wires placed on the right ventricle and right atrium.  When the patient had rewarmed to core temperature of 37 degrees Celsius.  He was weaned from cardiopulmonary bypass on the first attempt without difficulty.  He did not require inotropic support.  Total bypass time was 78 minutes.  The postbypass transesophageal echocardiography was unchanged from the prebypass study.  A test dose of protamine was administered and was well tolerated.  The atrial and aortic cannulae were removed.  The remainder of the protamine was administered without incident.  The chest was irrigated with warm saline.  Hemostasis was achieved.  Left pleural mediastinal chest tubes were placed in separate subcostal incisions.  The pericardium was closed over the ascending aorta and base of the heart with interrupted 3-0 silk sutures.  Sternum was closed with a combination of single and double heavy gauge stainless steel wires.  Pectoralis fascia, subcutaneous tissue, and skin were closed in standard fashion.  All sponge, needle, and instrument counts were correct at the end of the procedure.  The patient was taken from the operating room to the Surgical Intensive Care Unit intubated and in good condition.     Salvatore Decent Dorris Fetch, M.D.     SCH/MEDQ  D:  06/29/2015  T:  06/30/2015   Job:  [id number]

## 2015-06-30 NOTE — Progress Notes (Signed)
[address]       [name] [address]             [phone]        CARDIOTHORACIC SURGERY PROGRESS NOTE   R1 Day Post-Op Procedure(s) (LRB): CORONARY ARTERY BYPASS GRAFTING (CABG) TIMES THREE USING LEFT INTERNAL MAMMARY ARTERY AND RIGHT GREATER SAPHENOUS VEIN HARVESTED BY ENDOVEIN (N/A) TRANSESOPHAGEAL ECHOCARDIOGRAM (TEE) (N/A)  Subjective: Looks good.  Mild soreness in chest.  Denies SOB  Objective: Vital signs: BP Readings from Last 1 Encounters:  06/30/15 111/67   Pulse Readings from Last 1 Encounters:  06/30/15 89   Resp Readings from Last 1 Encounters:  06/30/15 25   Temp Readings from Last 1 Encounters:  06/30/15 99.7 F (37.6 C)     Hemodynamics: PAP: (15-52)/(2-25) 51/19 mmHg CO:  [3.5 L/min-7.3 L/min] 7.3 L/min CI:  [1.8 L/min/m2-3.7 L/min/m2] 3.7 L/min/m2  Physical Exam:  Rhythm:   sinus  Breath sounds: clear  Heart sounds:  RRR  Incisions:  Dressings dry, intact  Abdomen:  Soft, non-distended, non-tender  Extremities:  Warm, well-perfused  Chest tubes:  Low volume thin serosanguinous output, no air leak    Intake/Output from previous day: 04/21 0701 - 04/22 0700 In: 6640.2 [I.V.:4487.2; Blood:353; IV Piggyback:1800] Out: 6794 [Urine:4110; Blood:2114; Chest Tube:570] Intake/Output this shift:    Lab Results:  CBC: Recent Labs  06/29/15 1800 06/29/15 1801 06/30/15 0406  WBC 13.4*  --  13.0*  HGB 9.5* 9.9* 8.6*  HCT 30.8* 29.0* 28.1*  PLT 159  --  148*    BMET:  Recent Labs  06/27/15 1003  06/29/15 1801 06/30/15 0406  NA 140  < > 144 141  K 3.7  < > 4.4 4.3  CL 110  < > 109 110  CO2 19*  --   --  24  GLUCOSE 130*  < > 136* 103*  BUN <5*  < > 5* 7  CREATININE 0.67  < > 0.60* 0.71  CALCIUM 9.3  --   --  8.2*  < > = values in this interval not displayed.   PT/INR:   Recent Labs  06/29/15 1224  LABPROT 18.9*  INR 1.58*    CBG (last 3)   Recent Labs  06/29/15 2201 06/29/15 2251  06/30/15 0003  GLUCAP 133* 112* 96    ABG    Component Value Date/Time   PHART 7.343* 06/30/2015 0031   PCO2ART 44.7 06/30/2015 0031   PO2ART 90.0 06/30/2015 0031   HCO3 24.1* 06/30/2015 0031   TCO2 25 06/30/2015 0031   ACIDBASEDEF 1.0 06/30/2015 0031   O2SAT 96.0 06/30/2015 0031    CXR: PORTABLE CHEST 1 VIEW  COMPARISON: 06/29/2015  FINDINGS: No mediastinal widening.  There is lung base opacity, mildly improved from the prior study. This is likely combination of small effusions and atelectasis. No convincing pulmonary edema.  No pneumothorax.  Endotracheal tube and nasal/ orogastric tube have been removed.  Right internal jugular Swan-Ganz catheter, mediastinal tube and left chest tube are stable.  IMPRESSION: 1. No evidence of an operative complication. 2. Mildly improved lung base atelectasis. No overt pulmonary edema. No pneumothorax. 3. Remaining support apparatus is well positioned.   Electronically Signed  By: Amie Portland M.D.  On: 06/30/2015 08:26   EKG: NSR w/out acute ischemic changes     Assessment/Plan: S/P Procedure(s) (LRB): CORONARY ARTERY BYPASS GRAFTING (CABG) TIMES THREE USING LEFT INTERNAL MAMMARY ARTERY AND RIGHT GREATER SAPHENOUS VEIN HARVESTED BY ENDOVEIN (N/A)  TRANSESOPHAGEAL ECHOCARDIOGRAM (TEE) (N/A)  Doing well POD1 Maintaining NSR w/ stable hemodynamics off all drips Breathing comfortably w/ O2 sats 93-95% on 1 L/min via [place] Expected post op acute blood loss anemia, Hgb 8.6 this morning Expected post op volume excess, mild Expected post op atelectasis, mild Type II diabetes mellitus, excellent glycemic control on insulin drip Hypertension   Mobilize  Diuresis  D/C lines  D/C tubes later today or tomorrow  Add levemir insulin and wean drip off  Andre Nailslarence H Giulliana Mcroberts, MD 06/30/2015 8:06 AM

## 2015-07-01 ENCOUNTER — Inpatient Hospital Stay (HOSPITAL_COMMUNITY): Payer: Medicaid Other

## 2015-07-01 LAB — CBC
HCT: 30.7 % — ABNORMAL LOW (ref 39.0–52.0)
Hemoglobin: 9.6 g/dL — ABNORMAL LOW (ref 13.0–17.0)
MCH: 25.3 pg — AB (ref 26.0–34.0)
MCHC: 31.3 g/dL (ref 30.0–36.0)
MCV: 80.8 fL (ref 78.0–100.0)
PLATELETS: 174 10*3/uL (ref 150–400)
RBC: 3.8 MIL/uL — ABNORMAL LOW (ref 4.22–5.81)
RDW: 15 % (ref 11.5–15.5)
WBC: 17 10*3/uL — ABNORMAL HIGH (ref 4.0–10.5)

## 2015-07-01 LAB — BASIC METABOLIC PANEL
Anion gap: 11 (ref 5–15)
BUN: 6 mg/dL (ref 6–20)
CALCIUM: 8.6 mg/dL — AB (ref 8.9–10.3)
CO2: 25 mmol/L (ref 22–32)
CREATININE: 0.79 mg/dL (ref 0.61–1.24)
Chloride: 99 mmol/L — ABNORMAL LOW (ref 101–111)
GFR calc Af Amer: >60 mL/min (ref >60)
GFR calc non Af Amer: >60 mL/min (ref >60)
GLUCOSE: 104 mg/dL — AB (ref 65–99)
Potassium: 4 mmol/L (ref 3.5–5.1)
Sodium: 135 mmol/L (ref 135–145)

## 2015-07-01 LAB — GLUCOSE, CAPILLARY
GLUCOSE-CAPILLARY: 110 mg/dL — AB (ref 65–99)
Glucose-Capillary: 117 mg/dL — ABNORMAL HIGH (ref 65–99)
Glucose-Capillary: 137 mg/dL — ABNORMAL HIGH (ref 65–99)
Glucose-Capillary: 122 mg/dL — ABNORMAL HIGH (ref 65–99)
Glucose-Capillary: 109 mg/dL — ABNORMAL HIGH (ref 65–99)

## 2015-07-01 MED ORDER — ENOXAPARIN SODIUM 30 MG/0.3ML ~~LOC~~ SOLN
30.0000 mg | SUBCUTANEOUS | Status: DC
  Administered 2015-07-01: 30 mg via SUBCUTANEOUS
  Filled 2015-07-01: qty 0.3

## 2015-07-01 MED ORDER — SODIUM CHLORIDE 0.9 % IV SOLN: 250.0000 mL | INTRAVENOUS | Status: DC | PRN

## 2015-07-01 MED ORDER — SODIUM CHLORIDE 0.9% FLUSH: 3.0000 mL | INTRAVENOUS | Status: DC | PRN

## 2015-07-01 MED ORDER — POTASSIUM CHLORIDE CRYS ER 20 MEQ PO TBCR
20.0000 meq | EXTENDED_RELEASE_TABLET | Freq: Every day | ORAL | Status: DC
Start: 2015-07-01 — End: 2015-07-03
  Administered 2015-07-01 – 2015-07-03 (×3): 20 meq via ORAL
  Filled 2015-07-01 (×3): qty 1

## 2015-07-01 MED ORDER — METOPROLOL TARTRATE 25 MG PO TABS
25.0000 mg | ORAL_TABLET | Freq: Two times a day (BID) | ORAL | Status: DC
  Administered 2015-07-01 – 2015-07-03 (×4): 25 mg via ORAL
  Filled 2015-07-01 (×4): qty 1

## 2015-07-01 MED ORDER — SODIUM CHLORIDE 0.9% FLUSH
3.0000 mL | Freq: Two times a day (BID) | INTRAVENOUS | Status: DC
Start: 2015-07-01 — End: 2015-07-03
  Administered 2015-07-01 – 2015-07-03 (×4): 3 mL via INTRAVENOUS

## 2015-07-01 MED ORDER — INSULIN ASPART 100 UNIT/ML ~~LOC~~ SOLN
0.0000 [IU] | Freq: Three times a day (TID) | SUBCUTANEOUS | Status: DC
  Administered 2015-07-01 (×2): 2 [IU] via SUBCUTANEOUS

## 2015-07-01 MED ORDER — MOVING RIGHT ALONG BOOK
Freq: Once | Status: AC
  Administered 2015-07-01: 1
  Filled 2015-07-01: qty 1

## 2015-07-01 MED ORDER — ASPIRIN 325 MG PO TABS: 325.0000 mg | ORAL_TABLET | Freq: Every day | ORAL | Status: DC

## 2015-07-01 MED ORDER — PANTOPRAZOLE SODIUM 40 MG PO TBEC
40.0000 mg | DELAYED_RELEASE_TABLET | Freq: Every day | ORAL | Status: DC
  Administered 2015-07-01 – 2015-07-03 (×3): 40 mg via ORAL
  Filled 2015-07-01 (×3): qty 1

## 2015-07-01 MED ORDER — FUROSEMIDE 40 MG PO TABS
40.0000 mg | ORAL_TABLET | Freq: Two times a day (BID) | ORAL | Status: DC
  Administered 2015-07-01 (×2): 40 mg via ORAL
  Filled 2015-07-01 (×2): qty 1

## 2015-07-01 NOTE — Progress Notes (Signed)
Pt received from ICU RN. Oriented to room and equipment. VSS. Telemetry applied, CCMD notified. Pt expressed surgical midsternal pain - given 50mg  tramadol. Call light at bedside. Will continue to monitor.   Leonidas Rombergaitlin S Bumbledare, RN

## 2015-07-01 NOTE — Progress Notes (Signed)
CRITICAL VALUE ALERT  Critical value received:  Chest X-ray reading, Trace Right apical Pneumothroax  Date of notification:  07/01/15  Time of notification:  0930  Critical value read back: yes  Nurse who received alert:  Fatima SangerFred Zitlali Primm RN MD notified (1st page): MD Cornelius Moraswen while on unit at 0935  Time of first page:    MD notified (2nd page):  Time of second page:  Responding MD:  MD Cornelius Moraswen  Time MD responded:

## 2015-07-01 NOTE — Progress Notes (Signed)
[address]       [place] [id number]             [phone]        CARDIOTHORACIC SURGERY PROGRESS NOTE   R2 Days Post-Op Procedure(s) (LRB): CORONARY ARTERY BYPASS GRAFTING (CABG) TIMES THREE USING LEFT INTERNAL MAMMARY ARTERY AND RIGHT GREATER SAPHENOUS VEIN HARVESTED BY ENDOVEIN (N/A) TRANSESOPHAGEAL ECHOCARDIOGRAM (TEE) (N/A)  Subjective: Looks good and feels well.  Only complaint is pain in right hip related to arthritis, which patient states is worse on rainy days.  Already ambulated in SICU.  Eating breakfast.  Objective: Vital signs: BP Readings from Last 1 Encounters:  07/01/15 97/64   Pulse Readings from Last 1 Encounters:  07/01/15 94   Resp Readings from Last 1 Encounters:  07/01/15 20   Temp Readings from Last 1 Encounters:  07/01/15 98.4 F (36.9 C) Oral    Hemodynamics: PAP: (33)/(12) 33/12 mmHg  Physical Exam:  Rhythm:   sinus  Breath sounds: clear  Heart sounds:  RRR  Incisions:  Dressing dry, intact  Abdomen:  Soft, non-distended, non-tender  Extremities:  Warm, well-perfused  Chest tubes:  Low volume thin serosanguinous output, no air leak    Intake/Output from previous day: 04/22 0701 - 04/23 0700 In: 715.1 [I.V.:465.1; IV Piggyback:250] Out: 4420 [Urine:4170; Chest Tube:250] Intake/Output this shift: Total I/O In: 40 [I.V.:40] Out: 110 [Urine:110]  Lab Results:  CBC: Recent Labs  06/30/15 1630 06/30/15 1634 07/01/15 0420  WBC 16.4*  --  17.0*  HGB 9.5* 10.5* 9.6*  HCT 30.9* 31.0* 30.7*  PLT 184  --  174    BMET:  Recent Labs  06/30/15 0406  06/30/15 1634 07/01/15 0420  NA 141  --  138 135  K 4.3  --  3.6 4.0  CL 110  --  97* 99*  CO2 24  --   --  25  GLUCOSE 103*  --  118* 104*  BUN 7  --  6 6  CREATININE 0.71  < > 0.80 0.79  CALCIUM 8.2*  --   --  8.6*  < > = values in this interval not displayed.   PT/INR:   Recent Labs  06/29/15 1224  LABPROT 18.9*  INR 1.58*    CBG (last 3)     Recent Labs  06/30/15 2337 07/01/15 0423 07/01/15 0740  GLUCAP 114* 110* 117*    ABG    Component Value Date/Time   PHART 7.343* 06/30/2015 0031   PCO2ART 44.7 06/30/2015 0031   PO2ART 90.0 06/30/2015 0031   HCO3 24.1* 06/30/2015 0031   TCO2 26 06/30/2015 1634   ACIDBASEDEF 1.0 06/30/2015 0031   O2SAT 96.0 06/30/2015 0031    CXR: Bibasilar atelectasis and possible small right effusion, mild pulm vasc congestion  Assessment/Plan: S/P Procedure(s) (LRB): CORONARY ARTERY BYPASS GRAFTING (CABG) TIMES THREE USING LEFT INTERNAL MAMMARY ARTERY AND RIGHT GREATER SAPHENOUS VEIN HARVESTED BY ENDOVEIN (N/A) TRANSESOPHAGEAL ECHOCARDIOGRAM (TEE) (N/A)  Doing well POD2 Maintaining NSR w/ stable BP Breathing comfortably w/ O2 sats 96% on 2 L/min via [place] Expected post op acute blood loss anemia, Hgb up to 9.6 this morning Expected post op volume excess, mild Expected post op atelectasis, mild-moderated Type II diabetes mellitus, excellent glycemic control  Hypertension Chronic tobacco abuse   Mobilize  Diuresis  Increase metoprolol to pre-op dose  Continue to hold ACE-I for now, possibly restart soon  D/C tubes   Change CBG's and SSI to ac/hs  Nicotine patch  Transfer 2W   Purcell Nails, MD 07/01/2015 9:12 AM

## 2015-07-02 ENCOUNTER — Inpatient Hospital Stay (HOSPITAL_COMMUNITY): Payer: Medicaid Other

## 2015-07-02 ENCOUNTER — Encounter (HOSPITAL_COMMUNITY): Payer: Self-pay | Admitting: Thoracic Surgery (Cardiothoracic Vascular Surgery)

## 2015-07-02 LAB — CBC
HCT: 31.5 % — ABNORMAL LOW (ref 39.0–52.0)
Hemoglobin: 9.8 g/dL — ABNORMAL LOW (ref 13.0–17.0)
MCH: 24.8 pg — AB (ref 26.0–34.0)
MCHC: 31.1 g/dL (ref 30.0–36.0)
MCV: 79.7 fL (ref 78.0–100.0)
PLATELETS: 196 10*3/uL (ref 150–400)
RBC: 3.95 MIL/uL — ABNORMAL LOW (ref 4.22–5.81)
RDW: 14.9 % (ref 11.5–15.5)
WBC: 16.3 10*3/uL — ABNORMAL HIGH (ref 4.0–10.5)

## 2015-07-02 LAB — BASIC METABOLIC PANEL
Anion gap: 12 (ref 5–15)
BUN: 9 mg/dL (ref 6–20)
CALCIUM: 8.7 mg/dL — AB (ref 8.9–10.3)
CO2: 24 mmol/L (ref 22–32)
CREATININE: 0.85 mg/dL (ref 0.61–1.24)
Chloride: 100 mmol/L — ABNORMAL LOW (ref 101–111)
Glucose, Bld: 131 mg/dL — ABNORMAL HIGH (ref 65–99)
Potassium: 3.9 mmol/L (ref 3.5–5.1)
SODIUM: 136 mmol/L (ref 135–145)

## 2015-07-02 LAB — GLUCOSE, CAPILLARY
GLUCOSE-CAPILLARY: 116 mg/dL — AB (ref 65–99)
GLUCOSE-CAPILLARY: 112 mg/dL — AB (ref 65–99)
Glucose-Capillary: 158 mg/dL — ABNORMAL HIGH (ref 65–99)

## 2015-07-02 MED ORDER — LACTULOSE 10 GM/15ML PO SOLN
20.0000 g | Freq: Once | ORAL | Status: AC
  Administered 2015-07-02: 20 g via ORAL
  Filled 2015-07-02: qty 30

## 2015-07-02 MED ORDER — POTASSIUM CHLORIDE CRYS ER 20 MEQ PO TBCR
20.0000 meq | EXTENDED_RELEASE_TABLET | Freq: Once | ORAL | Status: AC
  Administered 2015-07-02: 20 meq via ORAL
  Filled 2015-07-02: qty 1

## 2015-07-02 MED ORDER — ENOXAPARIN SODIUM 30 MG/0.3ML ~~LOC~~ SOLN
30.0000 mg | SUBCUTANEOUS | Status: DC
  Administered 2015-07-03: 30 mg via SUBCUTANEOUS
  Filled 2015-07-02: qty 0.3

## 2015-07-02 MED ORDER — FUROSEMIDE 40 MG PO TABS
40.0000 mg | ORAL_TABLET | Freq: Every day | ORAL | Status: DC
  Administered 2015-07-02 – 2015-07-03 (×2): 40 mg via ORAL
  Filled 2015-07-02 (×2): qty 1

## 2015-07-02 MED FILL — Heparin Sodium (Porcine) Inj 1000 Unit/ML: INTRAMUSCULAR | Qty: 10 | Status: AC

## 2015-07-02 MED FILL — Lidocaine HCl IV Inj 20 MG/ML: INTRAVENOUS | Qty: 5 | Status: AC

## 2015-07-02 MED FILL — Sodium Bicarbonate IV Soln 8.4%: INTRAVENOUS | Qty: 50 | Status: AC

## 2015-07-02 MED FILL — Electrolyte-R (PH 7.4) Solution: INTRAVENOUS | Qty: 3000 | Status: AC

## 2015-07-02 MED FILL — Sodium Chloride IV Soln 0.9%: INTRAVENOUS | Qty: 2000 | Status: AC

## 2015-07-02 MED FILL — Mannitol IV Soln 20%: INTRAVENOUS | Qty: 500 | Status: AC

## 2015-07-02 NOTE — Progress Notes (Signed)
Pacing wires pulled, Rt wire met mild resistance - both removed intact. Site dry and w/o drainage. Area swabbed with betadine, left open to air.

## 2015-07-02 NOTE — Progress Notes (Signed)
CARDIAC REHAB PHASE I   PRE:  Rate/Rhythm: 90 SR  BP:  Sitting: 97/55        SaO2: 85 RA>881L>99 2L  MODE:  Ambulation: 350 ft  POST:  Rate/Rhythm: 105 ST  BP:  Sitting: 111/66         SaO2: 95 2L  Pt in bed, sats 85% on RA, increased to 99% on 2L. Pt required significant assistance to stand, needed reminder cues for sternal precautions. Pt ambulated 350 ft on 2L O2, rolling walker, gait belt, mildly unsteady gait, tolerated fairly well. Pt c/o incisional pain 8/10, denies any other complaints. Pt required significant assistance to return to bed, declined to sit up in recliner. Encouraged IS, additional ambulation, OOB as tolerated. Pt states he uses a cane at home, does not have RW, would likely benefit from RW for home use. Pt also would benefit from PT consult, RN notified. Pt to bed after walk per pt request, bed alarm on, call bell within reach. Will follow.  [phone] Joylene GrapesEmily C Romona Murdy, RN, BSN 07/02/2015 9:45 AM

## 2015-07-02 NOTE — Progress Notes (Addendum)
      [address]       [place] [id number]             [phone]        3 Days Post-Op Procedure(s) (LRB): CORONARY ARTERY BYPASS GRAFTING (CABG) TIMES THREE USING LEFT INTERNAL MAMMARY ARTERY AND RIGHT GREATER SAPHENOUS VEIN HARVESTED BY ENDOVEIN (N/A) TRANSESOPHAGEAL ECHOCARDIOGRAM (TEE) (N/A)  Subjective: Patient eating breakfast. No bowel movement yet.   Objective: Vital signs in last 24 hours: Temp:  [98.1 F (36.7 C)-99.4 F (37.4 C)] 98.2 F (36.8 C) (04/24 0538) Pulse Rate:  [82-103] 84 (04/24 0538) Cardiac Rhythm:  [-] Normal sinus rhythm (04/24 0059) Resp:  [16-27] 18 (04/24 0538) BP: (80-119)/(41-82) 93/41 mmHg (04/24 0538) SpO2:  [91 %-98 %] 91 % (04/24 0538) Weight:  [172 lb 1.6 oz (78.064 kg)] 172 lb 1.6 oz (78.064 kg) (04/24 0538)  Pre op weight 79 kg Current Weight  07/02/15 172 lb 1.6 oz (78.064 kg)      Intake/Output from previous day: 04/23 0701 - 04/24 0700 In: 40 [I.V.:40] Out: 970 [Urine:960; Chest Tube:10]   Physical Exam:  Cardiovascular: RRR Pulmonary: Coarse breath sounds bilaterally  Abdomen: Soft, non tender, bowel sounds present. Extremities: Mild bilateral lower extremity edema. Wounds: Dressing is removed. Clean and dry.  No erythema or signs of infection.  Lab Results: CBC: Recent Labs  07/01/15 0420 07/02/15 0303  WBC 17.0* 16.3*  HGB 9.6* 9.8*  HCT 30.7* 31.5*  PLT 174 196   BMET:  Recent Labs  07/01/15 0420 07/02/15 0303  NA 135 136  K 4.0 3.9  CL 99* 100*  CO2 25 24  GLUCOSE 104* 131*  BUN 6 9  CREATININE 0.79 0.85  CALCIUM 8.6* 8.7*    PT/INR:  Lab Results  Component Value Date   INR 1.58* 06/29/2015   INR 1.05 06/27/2015   ABG:  INR: Will add last result for INR, ABG once components are confirmed Will add last 4 CBG results once components are confirmed  Assessment/Plan:  1. CV - On Lopressor 25 mg bid. BP labile this am so will give parameters for Lopressor. 2.   Pulmonary - On 1 liter of oxygen via [place]. Wean to room air as tolerates. CXR shows no pneumothorax, bibasilar atelectasis, small bilateral pleural effusions, cardiomegaly with vascular congestion. Encourage incentive spirometer. 3. Volume Overload - On Lasix 40 mg bid. Will decrease to daily 4.  Acute blood loss anemia - H and H stable at 9.8 and 31.5 5. Supplement potassium 6.CBGs 122/109/116. Pre op HGA1C 6.1. He is likely pre diabetic. Will stop accu checks and SS PRN 7. Remove EPW 8. LOC 9. Will get social work to evaluate  OmnicomIMMERMAN,DONIELLE MPA-C 07/02/2015,7:39 AM  Patient seen and examined, agree with above He says he is going home with his son- will need to confirm details but if he has a place to go he can probably go tomorrow  Viviann SpareSteven C. Dorris FetchHendrickson, MD [facility] [phone]

## 2015-07-02 NOTE — Discharge Summary (Signed)
Physician Discharge Summary  Patient ID: Andre CairoLarry Rojas MRN: [id number] DOB/AGE: 63/11/1950 63 y.o.  Admit date: 06/29/2015 Discharge date: 07/03/2015  Admission Diagnoses:  Patient Active Problem List   Diagnosis Date Noted  . Coronary artery disease 06/26/2015  . Non-ST elevation MI (NSTEMI) (HCC) 06/26/2015  . Hyperlipidemia 06/26/2015  . Diabetes mellitus (HCC) 06/26/2015  . Alcohol dependence (HCC) 10/16/2012  . Cocaine abuse 10/16/2012  . Psychosis 10/16/2012  . Unspecified episodic mood disorder 10/16/2012   Discharge Diagnoses:   Patient Active Problem List   Diagnosis Date Noted  . S/P CABG x 3 06/29/2015  . Coronary artery disease 06/26/2015  . Non-ST elevation MI (NSTEMI) (HCC) 06/26/2015  . Hyperlipidemia 06/26/2015  . Diabetes mellitus (HCC) 06/26/2015  . Alcohol dependence (HCC) 10/16/2012  . Cocaine abuse 10/16/2012  . Psychosis 10/16/2012  . Unspecified episodic mood disorder 10/16/2012   Discharged Condition: good  History of Present Illness:  [name] is a 65 yo African American Male.  He was recently visiting his daughter in [place] which he developed chest pain.  He presented to a local hospital at which time workup revealed the patient to have CAD with one vessel that was 100% blocked and the other one was 99 % blocked and that he would require immediate surgery.  He states they found out [facility] and he was discharged from the hospital.  He was brought back to the area by his daughter, but since that time he has been living on the streets sleeping in a sleeping bag.  He does have a son who lives in the area.  He was seen in [place] at which time he admitted to have continued mild chest tightness with exertion.  This is associated with dyspnea.  He was referred to [facility] for evaluation for coronary bypass procedure.  He was evaluated by Dr. Dorris FetchHendrickson who was in agreement the patient would benefit from bypass surgery.  The risks and  benefits of the procedure were explained to the patient and he was agreeable to proceed.  Hospital Course:   [name] [facility].  He was taken to the operating room and underwent CABG x 3 utilizing LIMA to LAD, SVG to PDA, SVG to Ramus Intermediate.  He also underwent endoscopic harvest of the greater saphenous vein from the right thigh.  He tolerated the procedure without difficulty and was taken to the SICU in stable condition.  He was extubated the evening of surgery.  His chest tubes and arterial lines were removed without difficulty.  His lopressor dose was titrated as BP allowed.  He was maintaining NSR and felt medically stable for transfer to the telemetry unit on POD #2.  The patient continues to make progress.  He was treated with Lasix for hypervolemia.  He was mildly unsteady with ambulation and PT consult was obtained.  Son has agreed to take his dad for a few weeks. After he is seen by Dr. Dorris FetchHendrickson in the office, patient is to return to [place] to live with his daughter. His blood sugars have been well controlled and his preoperative A1c was 6.1.  He is likely pre diabetic and will need further surveillance of his HGA1C by a medical doctor. He was also instructed to obtain a cardiologist ocne he arrives in [place], as he will require long term follow up. He continues to maintain NSR and his pacing wires have been removed.  Per Dr. Dorris FetchHendrickson, he is felt surgically  stable for discharge with son today.  Treatments: surgery:   Median sternotomy, extracorporeal circulation, coronary artery bypass grafting x 3 (left internal mammary artery to left anterior descending coronary artery, saphenous vein graft to ramus intermedius, aphenous vein graft to posterolateral branch of right coronary), endoscopic vein harvest, right thigh.  Disposition: 01-Home or Self Care   Discharge medications:    Medication List    STOP taking these medications         lisinopril 5 MG tablet  Commonly known as:  PRINIVIL,ZESTRIL     nitroGLYCERIN 0.4 MG SL tablet  Commonly known as:  NITROSTAT      TAKE these medications        aspirin 325 MG tablet  Take 325 mg by mouth daily.     atorvastatin 80 MG tablet  Commonly known as:  LIPITOR  Take 1 tablet (80 mg total) by mouth daily at 6 PM.     metoprolol tartrate 25 MG tablet  Commonly known as:  LOPRESSOR  Take 1 tablet (25 mg total) by mouth 2 (two) times daily.     nicotine 14 mg/24hr patch  Commonly known as:  NICODERM CQ - dosed in mg/24 hours  Place 1 patch (14 mg total) onto the skin daily.     oxyCODONE 5 MG immediate release tablet  Commonly known as:  Oxy IR/ROXICODONE  Take 1-2 tablets (5-10 mg total) by mouth every 4 (four) hours as needed for severe pain.     pantoprazole 40 MG tablet  Commonly known as:  PROTONIX  Take 1 tablet (40 mg total) by mouth daily.      The patient has been discharged on:   1.Beta Blocker:  Yes [  x ]                              No   [   ]                              If No, reason:  2.Ace Inhibitor/ARB: Yes [   ]                                     No  [   x ]                                     If No, reason:Labile BP  3.Statin:   Yes [ x  ]                  No  [   ]                  If No, reason:  4.Ecasa:  Yes  [x   ]                  No   [   ]                  If No, reason:   Follow-up Information    Follow up with Loreli Slot, MD On 07/17/2015.   Specialty:  Cardiothoracic Surgery   Why:  Appoinment is at 12:00 pm   Contact information:   [address] [place] [address] [phone]       Follow up with [facility] On 07/17/2015.   Why:  Please get CXR at 11:00   Contact information:   [facility]       Follow up with Cardiologist.   Why:  Please obtain cardiologist to follow long term as had coronary artery bypass grafting surgery      Follow up with Medical doctor.   Why:   Call to obtain a follow up appointment regarding further surveillance of HGA1C 6.1 (pre diabetes)      Follow up with [facility].   Why:  rolling walker arranged- to be delivered to room prior to discharge-    Contact information:   [address] [place] [address] [phone]       Signed: Ardelle Balls PA-C 07/03/2015, 11:04 AM

## 2015-07-02 NOTE — Discharge Instructions (Signed)
Coronary Artery Bypass Grafting, Care After °Refer to this sheet in the next few weeks. These instructions provide you with information on caring for yourself after your procedure. Your health care provider may also give you more specific instructions. Your treatment has been planned according to current medical practices, but problems sometimes occur. Call your health care provider if you have any problems or questions after your procedure. °WHAT TO EXPECT AFTER THE PROCEDURE °Recovery from surgery will be different for everyone. Some people feel well after 3 or 4 weeks, while for others it takes longer. After your procedure, it is typical to have the following: °· Nausea and a lack of appetite.   °· Constipation. °· Weakness and fatigue.   °· Depression or irritability.   °· Pain or discomfort at your incision site. °HOME CARE INSTRUCTIONS °· Take medicines only as directed by your health care provider. Do not stop taking medicines or start any new medicines without first checking with your health care provider. °· Take your pulse as directed by your health care provider. °· Perform deep breathing as directed by your health care provider. If you were given a device called an incentive spirometer, use it to practice deep breathing several times a day. Support your chest with a pillow or your arms when you take deep breaths or cough. °· Keep incision areas clean, dry, and protected. Remove or change any bandages (dressings) only as directed by your health care provider. You may have skin adhesive strips over the incision areas. Do not take the strips off. They will fall off on their own. °· Check incision areas daily for any swelling, redness, or drainage. °· If incisions were made in your legs, do the following: °¨ Avoid crossing your legs.   °¨ Avoid sitting for long periods of time. Change positions every 30 minutes.   °¨ Elevate your legs when you are sitting. °· Wear compression stockings as directed by your  health care provider. These stockings help keep blood clots from forming in your legs. °· Take showers once your health care provider approves. Until then, only take sponge baths. Pat incisions dry. Do not rub incisions with a washcloth or towel. Do not take baths, swim, or use a hot tub until your health care provider approves. °· Eat foods that are high in fiber, such as raw fruits and vegetables, whole grains, beans, and nuts. Meats should be lean cut. Avoid canned, processed, and fried foods. °· Drink enough fluid to keep your urine clear or pale yellow. °· Weigh yourself every day. This helps identify if you are retaining fluid that may make your heart and lungs work harder. °· Rest and limit activity as directed by your health care provider. You may be instructed to: °¨ Stop any activity at once if you have chest pain, shortness of breath, irregular heartbeats, or dizziness. Get help right away if you have any of these symptoms. °¨ Move around frequently for short periods or take short walks as directed by your health care provider. Increase your activities gradually. You may need physical therapy or cardiac rehabilitation to help strengthen your muscles and build your endurance. °¨ Avoid lifting, pushing, or pulling anything heavier than 10 lb (4.5 kg) for at least 6 weeks after surgery. °· Do not drive until your health care provider approves.  °· Ask your health care provider when you may return to work. °· Ask your health care provider when you may resume sexual activity. °· Keep all follow-up visits as directed by your health care   provider. This is important. °SEEK MEDICAL CARE IF: °· You have swelling, redness, increasing pain, or drainage at the site of an incision. °· You have a fever. °· You have swelling in your ankles or legs. °· You have pain in your legs.   °· You gain 2 or more pounds (0.9 kg) a day. °· You are nauseous or vomit. °· You have diarrhea.  °SEEK IMMEDIATE MEDICAL CARE IF: °· You have  chest pain that goes to your jaw or arms. °· You have shortness of breath.   °· You have a fast or irregular heartbeat.   °· You notice a "clicking" in your breastbone (sternum) when you move.   °· You have numbness or weakness in your arms or legs. °· You feel dizzy or light-headed.   °MAKE SURE YOU: °· Understand these instructions. °· Will watch your condition. °· Will get help right away if you are not doing well or get worse. °  °This information is not intended to replace advice given to you by your health care provider. Make sure you discuss any questions you have with your health care provider. °  °Document Released: 09/13/2004 Document Revised: 03/17/2014 Document Reviewed: 08/03/2012 °Elsevier Interactive Patient Education ©2016 [employer] Inc[employer] ° °Endoscopic Saphenous Vein Harvesting, Care After °Refer to this sheet in the next few weeks. These instructions provide you with information on caring for yourself after your procedure. Your health care provider may also give you more specific instructions. Your treatment has been planned according to current medical practices, but problems sometimes occur. Call your health care provider if you have any problems or questions after your procedure. °HOME CARE INSTRUCTIONS °Medicine °· Take whatever pain medicine your surgeon prescribes. Follow the directions carefully. Do not take over-the-counter pain medicine unless your surgeon says it is okay. Some pain medicine can cause bleeding problems for several weeks after surgery. °· Follow your surgeon's instructions about driving. You will probably not be permitted to drive after heart surgery. °· Take any medicines your surgeon prescribes. Any medicines you took before your heart surgery should be checked with your health care provider before you start taking them again. °Wound care °· If your surgeon has prescribed an elastic bandage or stocking, ask how long you should wear it. °· Check the area around your surgical  cuts (incisions) whenever your bandages (dressings) are changed. Look for any redness or swelling. °· You will need to return to have the stitches (sutures) or staples taken out. Ask your surgeon when to do that. °· Ask your surgeon when you can shower or bathe. °Activity °· Try to keep your legs raised when you are sitting. °· Do any exercises your health care providers have given you. These may include deep breathing exercises, coughing, walking, or other exercises. °SEEK MEDICAL CARE IF: °· You have any questions about your medicines. °· You have more leg pain, especially if your pain medicine stops working. °· New or growing bruises develop on your leg. °· Your leg swells, feels tight, or becomes red. °· You have numbness in your leg. °SEEK IMMEDIATE MEDICAL CARE IF: °· Your pain gets much worse. °· Blood or fluid leaks from any of the incisions. °· Your incisions become warm, swollen, or red. °· You have chest pain. °· You have trouble breathing. °· You have a fever. °· You have more pain near your leg incision. °MAKE SURE YOU: °· Understand these instructions. °· Will watch your condition. °· Will get help right away if you are not doing well or   get worse. °  °This information is not intended to replace advice given to you by your health care provider. Make sure you discuss any questions you have with your health care provider. °  °Document Released: 11/06/2010 Document Revised: 03/17/2014 Document Reviewed: 11/06/2010 °Elsevier Interactive Patient Education ©2016 Elsevier Inc. ° ° °

## 2015-07-02 NOTE — Clinical Social Work Note (Signed)
Social work was consulted to see patient because of housing issues and homelessness concerns. BSW intern went to patient room to discuss this with the patient. Patient stated that he lives in [place] with his daughter and is currently in [place] with his son. Patient brother and sister were present during this meeting. Patient stated that he has been in [place] since March 27th and plans to return to [place] once he is healthy. BSW intern signing off.  Jenita SeashoreMaggie Narmeen Kerper BSW Intern, [phone]

## 2015-07-02 NOTE — Progress Notes (Signed)
Utilization review completed.  

## 2015-07-03 LAB — GLUCOSE, CAPILLARY: Glucose-Capillary: 134 mg/dL — ABNORMAL HIGH (ref 65–99)

## 2015-07-03 MED ORDER — NICOTINE 14 MG/24HR TD PT24: 14.0000 mg | MEDICATED_PATCH | Freq: Every day | TRANSDERMAL | Status: DC

## 2015-07-03 MED ORDER — METOPROLOL TARTRATE 25 MG PO TABS: 25.0000 mg | ORAL_TABLET | Freq: Two times a day (BID) | ORAL | Status: DC

## 2015-07-03 MED ORDER — ATORVASTATIN CALCIUM 80 MG PO TABS: 80.0000 mg | ORAL_TABLET | Freq: Every day | ORAL | Status: DC

## 2015-07-03 MED ORDER — OXYCODONE HCL 5 MG PO TABS: 5.0000 mg | ORAL_TABLET | ORAL | Status: DC | PRN

## 2015-07-03 MED ORDER — PANTOPRAZOLE SODIUM 40 MG PO TBEC: 40.0000 mg | DELAYED_RELEASE_TABLET | Freq: Every day | ORAL | Status: DC

## 2015-07-03 NOTE — Evaluation (Signed)
Physical Therapy Evaluation Patient Details Name: Andre Rojas MRN: [id number] DOB: Jul 29, 1950 Today's Date: 07/03/2015   History of Present Illness  pt presents post CABG x3.  pt with hx of DM, Chronic Back pain, CAD, PVD, and Peripheral Neuropathy.    Clinical Impression  Pt generally weak and not receptive to cueing for sternal precautions.  Noted plan is for pt to D/C to his son's home for 2 weeks then to [place] to live with his daughter.  Pt would benefit from HHPT for further education and work on pt's safe mobility.  No further acute PT needs at this time.  Will sign off.      Follow Up Recommendations Home health PT;Supervision/Assistance - 24 hour    Equipment Recommendations  Rolling walker with 5" wheels    Recommendations for Other Services       Precautions / Restrictions Precautions Precautions: Sternal Precaution Comments: Reviewed sternal precautions, but pt does not follow.   Restrictions Weight Bearing Restrictions: No      Mobility  Bed Mobility Overal bed mobility: Needs Assistance Bed Mobility: Rolling;Sidelying to Sit;Sit to Sidelying Rolling: Supervision Sidelying to sit: Supervision;HOB elevated     Sit to sidelying: Supervision General bed mobility comments: cues for log roll instead of pt requesting PT pull him up to sitting.    Transfers Overall transfer level: Needs assistance Equipment used: Rolling walker (2 wheeled) Transfers: Sit to/from Stand Sit to Stand: Min assist         General transfer comment: pt rocks holding on to his heart pillow.  Cued pt for hands to knees, but pt continues to rock and needs MinA for steadying in standing.    Ambulation/Gait Ambulation/Gait assistance: Min guard Ambulation Distance (Feet): 200 Feet Assistive device: Rolling walker (2 wheeled) Gait Pattern/deviations: Step-through pattern;Decreased stride length;Trunk flexed     General Gait Details: pt indicates fatigue today limiting ambulation  distance.  O2 sats on RA 93% at rest and 92% after ambulation.  pt with DOE, but denies feeling SOB.    Stairs            Wheelchair Mobility    Modified Rankin (Stroke Patients Only)       Balance                                             Pertinent Vitals/Pain Pain Assessment: Faces Faces Pain Scale: Hurts little more Pain Location: Only hurts when I cough Pain Descriptors / Indicators: Grimacing;Guarding Pain Intervention(s): Monitored during session;Premedicated before session;Repositioned    Home Living Family/patient expects to be discharged to:: Private residence Living Arrangements: Children (Son's house for 2 weeks then to daughter's home.) Available Help at Discharge: Family;Available 24 hours/day Type of Home: House Home Access: Stairs to enter   [employer] of Steps: "A couple I think" Home Layout: One level Home Equipment: None Additional Comments: pt was homeless PTA, bu did occasionally stay with his daughter in [place].  pt has a son in [place] who he can stay with until ready to go back to [place].    Prior Function Level of Independence: Independent               Hand Dominance        Extremity/Trunk Assessment   Upper Extremity Assessment: Overall WFL for tasks assessed           Lower  Extremity Assessment: Overall WFL for tasks assessed      Cervical / Trunk Assessment: Normal (Flexes due to incisional tightness)  Communication   Communication: No difficulties  Cognition Arousal/Alertness: Awake/alert Behavior During Therapy: WFL for tasks assessed/performed Overall Cognitive Status: Within Functional Limits for tasks assessed                      General Comments      Exercises        Assessment/Plan    PT Assessment All further PT needs can be met in the next venue of care  PT Diagnosis Difficulty walking   PT Problem List Decreased strength;Decreased activity  tolerance;Decreased balance;Decreased mobility;Decreased coordination;Decreased knowledge of use of DME;Decreased knowledge of precautions;Cardiopulmonary status limiting activity  PT Treatment Interventions     PT Goals (Current goals can be found in the Care Plan section) Acute Rehab PT Goals Patient Stated Goal: Home today. PT Goal Formulation: All assessment and education complete, DC therapy    Frequency     Barriers to discharge        Co-evaluation               End of Session Equipment Utilized During Treatment: Gait belt Activity Tolerance: Patient limited by fatigue Patient left: in bed;with call bell/phone within reach Nurse Communication: Mobility status         Time: 3267-1245 PT Time Calculation (min) (ACUTE ONLY): 18 min   Charges:   PT Evaluation $PT Eval Moderate Complexity: 1 Procedure     PT G CodesCatarina Hartshorn, [place] 07/03/2015, 11:40 AM

## 2015-07-03 NOTE — Progress Notes (Signed)
Discharge AVS meds taken today and those due this evening reviewed.  Follow-up appointments and when to call md reviewed.  D/C IV and TELE.  Questions and concerns addressed.   D/C home per orders.  °Deundre Thong C ° °

## 2015-07-03 NOTE — Progress Notes (Addendum)
      [address]       [place] [id number]             [phone]        4 Days Post-Op Procedure(s) (LRB): CORONARY ARTERY BYPASS GRAFTING (CABG) TIMES THREE USING LEFT INTERNAL MAMMARY ARTERY AND RIGHT GREATER SAPHENOUS VEIN HARVESTED BY ENDOVEIN (N/A) TRANSESOPHAGEAL ECHOCARDIOGRAM (TEE) (N/A)  Subjective: Patient had a bowel movement. No complaints this am.  Objective: Vital signs in last 24 hours: Temp:  [97.3 F (36.3 C)-98.2 F (36.8 C)] 97.7 F (36.5 C) (04/25 0446) Pulse Rate:  [81-89] 86 (04/25 0446) Cardiac Rhythm:  [-] Normal sinus rhythm (04/25 0707) Resp:  [18] 18 (04/25 0446) BP: (89-125)/(49-72) 125/49 mmHg (04/25 0446) SpO2:  [91 %-98 %] 94 % (04/25 0446) Weight:  [168 lb 8 oz (76.431 kg)] 168 lb 8 oz (76.431 kg) (04/25 0446)  Pre op weight 79 kg Current Weight  07/03/15 168 lb 8 oz (76.431 kg)      Intake/Output from previous day: 04/24 0701 - 04/25 0700 In: 240 [P.O.:240] Out: 875 [Urine:875]   Physical Exam:  Cardiovascular: RRR Pulmonary: Slightly diminished at bases Abdomen: Soft, non tender, bowel sounds present. Extremities: No lower extremity edema. Wounds:  Clean and dry.  No erythema or signs of infection.  Lab Results: CBC:  Recent Labs  07/01/15 0420 07/02/15 0303  WBC 17.0* 16.3*  HGB 9.6* 9.8*  HCT 30.7* 31.5*  PLT 174 196   BMET:   Recent Labs  07/01/15 0420 07/02/15 0303  NA 135 136  K 4.0 3.9  CL 99* 100*  CO2 25 24  GLUCOSE 104* 131*  BUN 6 9  CREATININE 0.79 0.85  CALCIUM 8.6* 8.7*    PT/INR:  Lab Results  Component Value Date   INR 1.58* 06/29/2015   INR 1.05 06/27/2015   ABG:  INR: Will add last result for INR, ABG once components are confirmed Will add last 4 CBG results once components are confirmed  Assessment/Plan:  1. CV - On Lopressor 25 mg bid.  2.  Pulmonary - On room air. Encourage incentive spirometer. Per cardiac rehab yesterday, desaturated with ambulation.  May need home oxygen. 3. Volume Overload - On Lasix 40 mg daily 4.  Acute blood loss anemia - Last H and H stable at 9.8 and 31.5 5. Remove sutures 6. Son is able to care for him, but needs to get patient back to [place] as soon as possible. Will change follow up appointment with Dr. Dorris FetchHendrickson.  Ziah Leandro MPA-C 07/03/2015,8:12 AM

## 2015-07-03 NOTE — Progress Notes (Signed)
CARDIAC REHAB PHASE I   Pt in bed, spoke with pt son over the phone, pts' son states he does not plan to come to the hospital until pt is discharged. Pt and son agreeable to complete cardiac surgery discharge education over the phone at this time. Cardiac surgery discharge education completed with pt at bedside and with son via telephone. Reviewed risk factors, tobacco cessation (gave pt fake cigarette), IS, sternal precautions, activity progression, exercise, heart healthy diet, sodium restrictions, daily weights, medication compliance and phase 2 cardiac rehab. Pt and son verbalized understanding. Pt plans to move to [place] to live with his daughter and will follow with a cardiologist there, advised pt to discuss phase 2 cardiac rehab referral with cardiologist in [place] once established. Pt and son verbalized understanding. PT at bedside to ambulate with pt prior to discharge and assess home needs. If pt does not discharge today, will follow up this afternoon for additional ambulation. Pt in bed, call bell within reach.   [phone] Joylene GrapesEmily C Deasia Chiu, RN, BSN 07/03/2015 10:38 AM

## 2015-07-03 NOTE — Care Management Note (Addendum)
Case Management Note Donn PieriniKristi Brittania Sudbeck RN, BSN Unit 2W-Case Manager [phone]  Patient Details  Name: Andre CairoLarry Morgenstern MRN: [id number] Date of Birth: 11/17/1950  Subjective/Objective:        Pt admitted s/p CABG            Action/Plan: PTA pt lived at home- orders for RW and home 02, [name]- RW to be delivered to room prior to discharge- [name] to look into home 02- as pt may not have qualifying dx- and insurance may not cover home- 02- Vaughan BastaJermaine will f/u and come speak with pt at bedside.   Expected Discharge Date:     07/03/15             Expected Discharge Plan:  Home/Self Care  In-House Referral:  Clinical Social Work  Discharge planning Services  CM Consult  Post Acute Care Choice:  Durable Medical Equipment Choice offered to:  Patient  DME Arranged:  Walker rolling, Oxygen DME Agency:  [facility]  HH Arranged:    HH Agency:     Status of Service:  Completed, signed off  Medicare Important Message Given:    Date Medicare IM Given:    Medicare IM give by:    Date Additional Medicare IM Given:    Additional Medicare Important Message give by:     If discussed at Long Length of Stay Meetings, dates discussed:    Additional Comments:  07/03/15- 1220- Donn PieriniKristi Rosemaria Inabinet RN, BSN- pt on ambulation today sats above 90%- will not need home 02- [name] aware- and will deliver just the RW to room prior to discharge.  Per PT recommendations for HH-PT - pt has Medicaid only - and does not have a qualifying dx for HHPT- spoke with pt and explained this to him- pt states he is on fixed income and would not be able [facility]- [facility] and confirmed that pt would not qualify for HHPT- [facility]- pt to d/c with his son- local # for son is [phone].   Darrold SpanWebster, Starr Urias Hall, RN 07/03/2015, 10:15 AM

## 2015-07-03 NOTE — Progress Notes (Signed)
07/03/2015 12:53 PM Chest tube sutures removed.  Pt tolerated well. Kathryne HitchAllen, Deondrea Markos C

## 2015-07-16 ENCOUNTER — Other Ambulatory Visit: Payer: Self-pay | Admitting: *Deleted

## 2015-07-16 DIAGNOSIS — Z951 Presence of aortocoronary bypass graft: Secondary | ICD-10-CM

## 2015-07-17 ENCOUNTER — Ambulatory Visit (INDEPENDENT_AMBULATORY_CARE_PROVIDER_SITE_OTHER): Payer: Self-pay | Admitting: Thoracic Surgery (Cardiothoracic Vascular Surgery)

## 2015-07-17 ENCOUNTER — Ambulatory Visit
Admission: RE | Admit: 2015-07-17 | Discharge: 2015-07-17 | Disposition: A | Discharge status: Home / Self Care | Payer: Medicaid Other | Source: Ambulatory Visit | Attending: Thoracic Surgery (Cardiothoracic Vascular Surgery) | Admitting: Thoracic Surgery (Cardiothoracic Vascular Surgery)

## 2015-07-17 ENCOUNTER — Encounter: Payer: Self-pay | Admitting: Thoracic Surgery (Cardiothoracic Vascular Surgery)

## 2015-07-17 VITALS — BP 99/69 | HR 67 | Resp 20 | Ht 69.0 in | Wt 168.0 lb

## 2015-07-17 DIAGNOSIS — Z951 Presence of aortocoronary bypass graft: Secondary | ICD-10-CM

## 2015-07-17 DIAGNOSIS — I214 Non-ST elevation (NSTEMI) myocardial infarction: Secondary | ICD-10-CM

## 2015-07-17 DIAGNOSIS — I2510 Atherosclerotic heart disease of native coronary artery without angina pectoris: Secondary | ICD-10-CM

## 2015-07-17 MED ORDER — OXYCODONE HCL 5 MG PO TABS: 5.0000 mg | ORAL_TABLET | ORAL | Status: DC | PRN

## 2015-07-17 NOTE — Progress Notes (Signed)
[address]       [place] [id number]             [phone]       HPI: Andre Rojas returns today for scheduled postoperative follow-up visit.  He is a 65 year old man who was [place] to the hospital in [place] recently with chest pain. He ruled in for non-ST elevation MI. At catheterization he had moderate left main disease and severe disease of the right coronary and LAD. He was turned down for surgery there because he has Medicaid and he returned to [place]. I did CABG 3 on 06/29/2015. His postoperative course was uncomplicated.  He is still having some incisional pain. He ran out of pain medication and has not been taking anything for that over the past several days. He doesn't have any pain related to his leg incision. He also denies any swelling in his legs. He's not had any angina or shortness of breath.  Past Medical History  Diagnosis Date  . Acid reflux   . Chronic back pain   . Chest pain   . Diabetes (HCC)   . Erectile dysfunction   . Hyperlipidemia   . CAD (coronary artery disease)   . Hypertension   . Peripheral vascular disease (HCC)     right leg stays cold  . COPD (chronic obstructive pulmonary disease) (HCC)   . Pneumonia   . Arthritis     hip, right  . Anemia   \   Current Outpatient Prescriptions  Medication Sig Dispense Refill  . aspirin 325 MG tablet Take 325 mg by mouth daily.    Marland Kitchen. atorvastatin (LIPITOR) 80 MG tablet Take 1 tablet (80 mg total) by mouth daily at 6 PM. 30 tablet 1  . metoprolol tartrate (LOPRESSOR) 25 MG tablet Take 1 tablet (25 mg total) by mouth 2 (two) times daily. 60 tablet 1  . nicotine (NICODERM CQ - DOSED IN MG/24 HOURS) 14 mg/24hr patch Place 1 patch (14 mg total) onto the skin daily. 28 patch 0  . oxyCODONE (OXY IR/ROXICODONE) 5 MG immediate release tablet Take 1-2 tablets (5-10 mg total) by mouth every 4 (four) hours as needed for severe pain. 40 tablet 0  . pantoprazole (PROTONIX) 40 MG  tablet Take 1 tablet (40 mg total) by mouth daily. 30 tablet 1   No current facility-administered medications for this visit.    Physical Exam BP 99/69 mmHg  Pulse 67  Resp 20  Ht 5\' 9"  (1.753 m)  Wt 168 lb (76.204 kg)  BMI 24.80 kg/m2  SpO352 7495% 65 year old man in no acute distress Alert and oriented 3 with no focal neurologic deficit Cardiac regular rate and rhythm normal S1 and S2 Sternum stable, incision healing well Lungs clear with equal breath sounds bilaterally Leg incisions clean dry and intact, no peripheral edema  Diagnostic Tests: CHEST 2 VIEW  COMPARISON: Chest x-ray of 07/02/2015  FINDINGS: No active infiltrate or effusion is seen. On the lateral view there is vague opacity posteriorly overlying the posterior mid lower thoracic spine most likely degenerative in origin, but not well seen previously. Attention to this area on followup chest x-ray is recommended. Mediastinal and hilar contours are unremarkable. The heart is mildly enlarged and stable. Median sternotomy sutures are noted from prior CABG.  IMPRESSION: No definite active infiltrate or effusion. Vague opacity on the lateral view posteriorly most likely is bony in origin. Consider followup chest x-ray.   Electronically Signed  By: Dwyane Dee M.D.  On: 07/17/2015 11:51  Impression: Andre Rojas is a 65 year old man who is about 3 weeks out from coronary bypass grafting 3. His surgery went well and his postoperative course was complicated.  He is still having incisional pain. I gave him a prescription for oxycodone 5 mg tablets one to 2 tablets 4 times daily as needed for pain, dispense 40 tablets, no refills.  I advised him not to lift anything over 10 pounds for another 3 weeks (June 1).  I encouraged him to walk on a regular basis.  He is traveling back to [place] tomorrow with his daughter and will live with her there. He needs to set up an appointment with a cardiologist as soon  as possible. We will be happy to forward his records once he has established care.  His chest x-ray today showed a vague opacity in the lateral view. On the what to make of that. He needs a follow-up chest x-ray is recommended by radiology. I informed him of this finding and gave him written instructions both on one of my cards as well as a print out to have that followed up.  Plan: I will be happy to see Andre Rojas again at any time if I can be of any further assistance with his care. I will also be happy to assist with this transition to new physicians in [place] in any way.  Loreli Slot, MD [facility] [phone]

## 2015-07-17 NOTE — Patient Instructions (Signed)
Do not lift anything over 10 pounds before the 1 st of June  Once you have a doctor in [place] contact our office [phone] and we will forward our records  You need another chest xray in the next 4-8 weeks. The Radiologist saw a shadow over the spine. It is probably not anything to worry about but does need to be rechecked

## 2015-08-07 ENCOUNTER — Ambulatory Visit: Payer: Self-pay | Admitting: Thoracic Surgery (Cardiothoracic Vascular Surgery)

## 2015-10-15 ENCOUNTER — Encounter (HOSPITAL_COMMUNITY): Payer: Self-pay

## 2017-04-16 IMAGING — CR DG HIP (WITH OR WITHOUT PELVIS) 2-3V*R*
3 series · 3 of 3 positions shown · non-contrast
Comparison: None

CLINICAL DATA: Increased RIGHT hip and groin pain this morning, has
been walking more lately

EXAM:
DG HIP (WITH OR WITHOUT PELVIS) 2-3V RIGHT

[t pelvis ap]
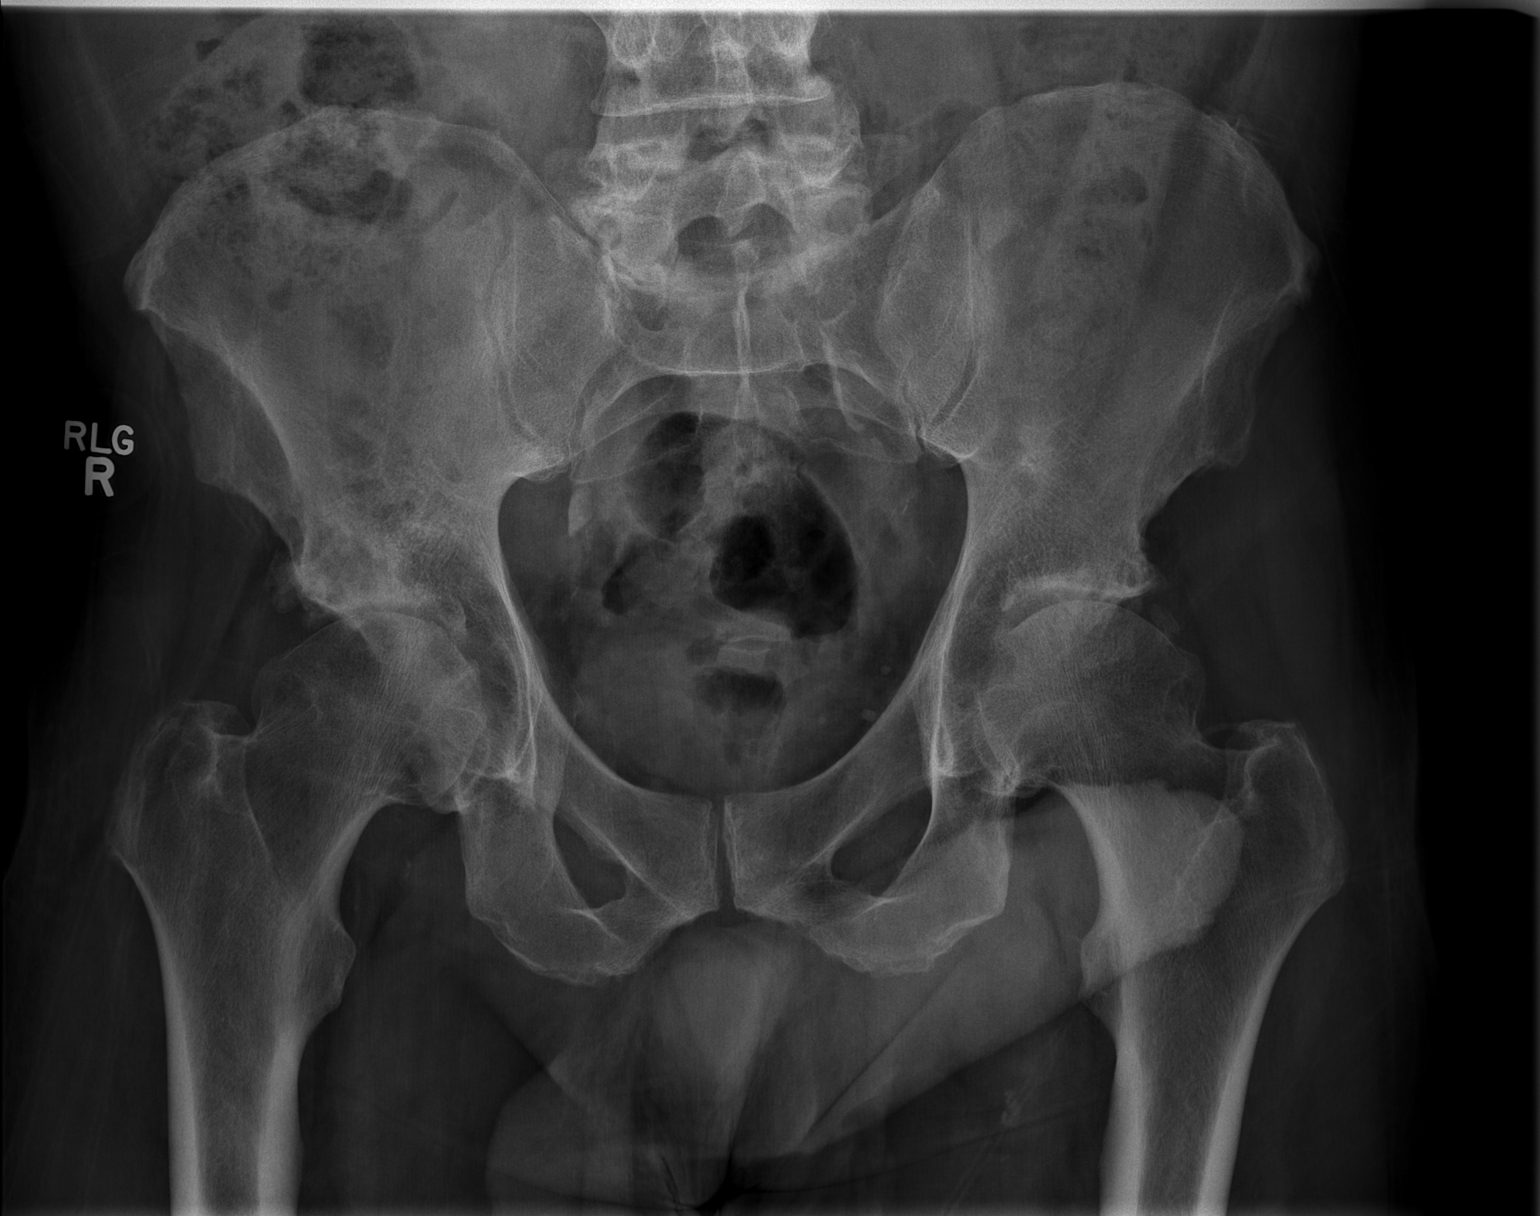

[t hip ap right]
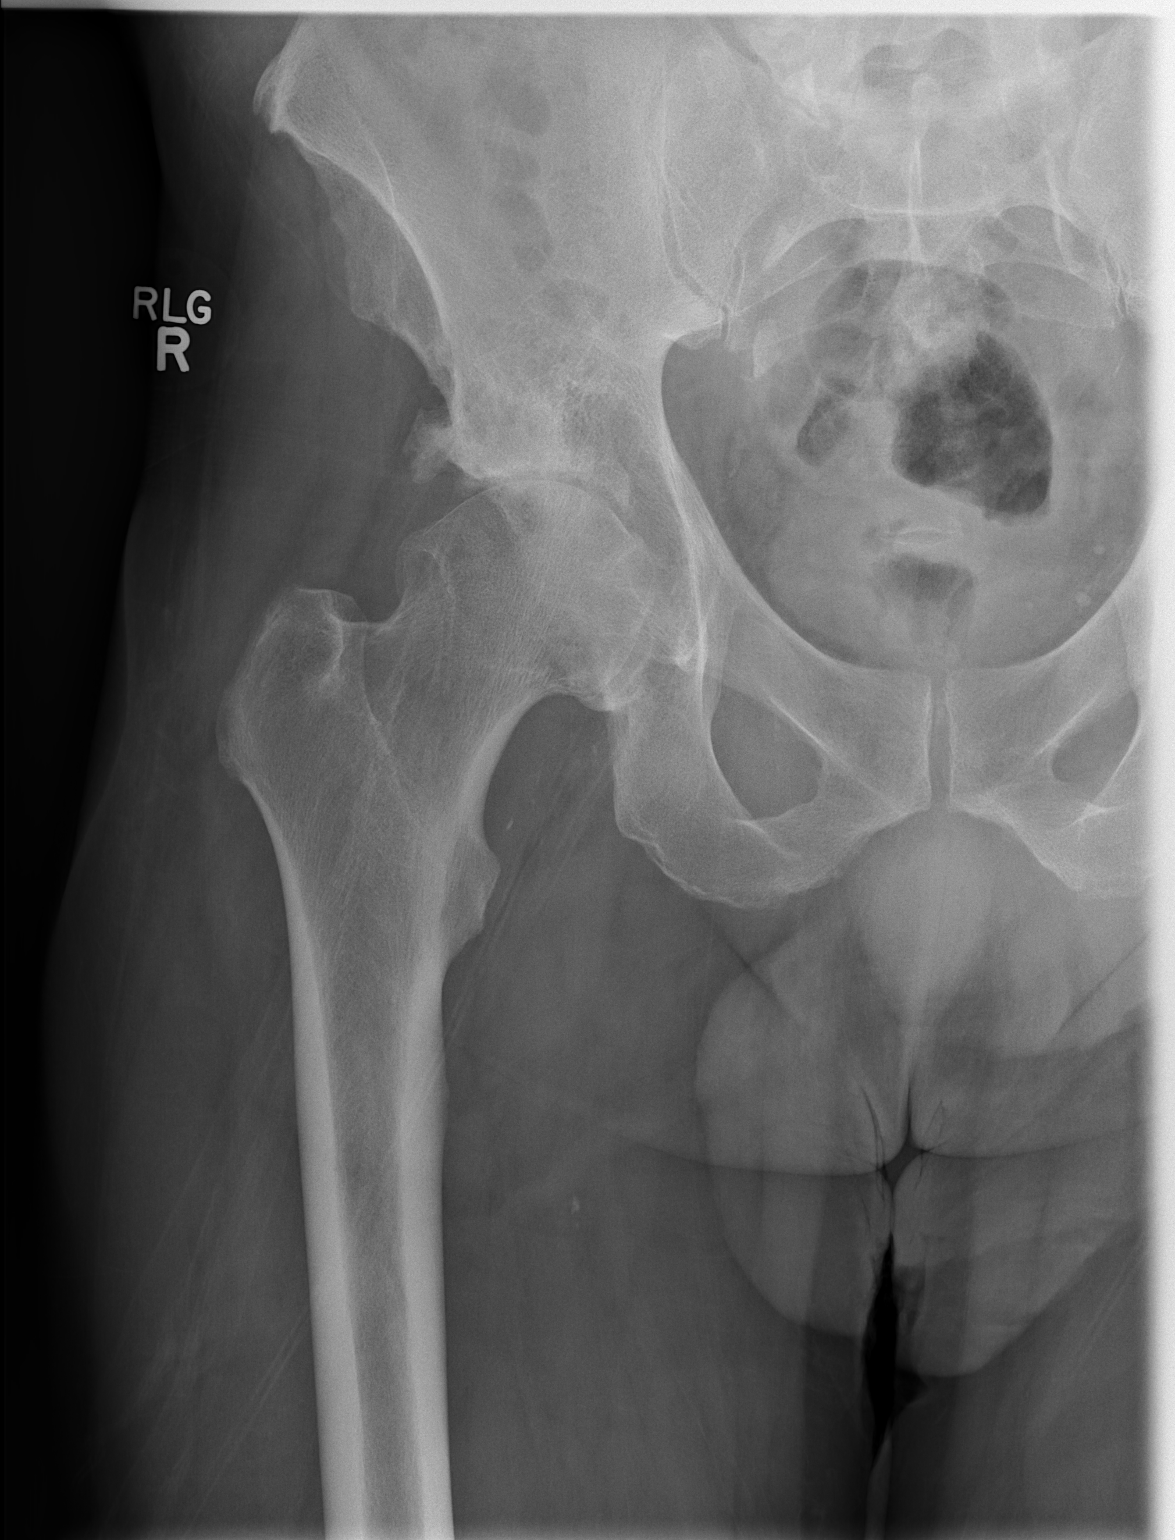

[t hip frog leg right]
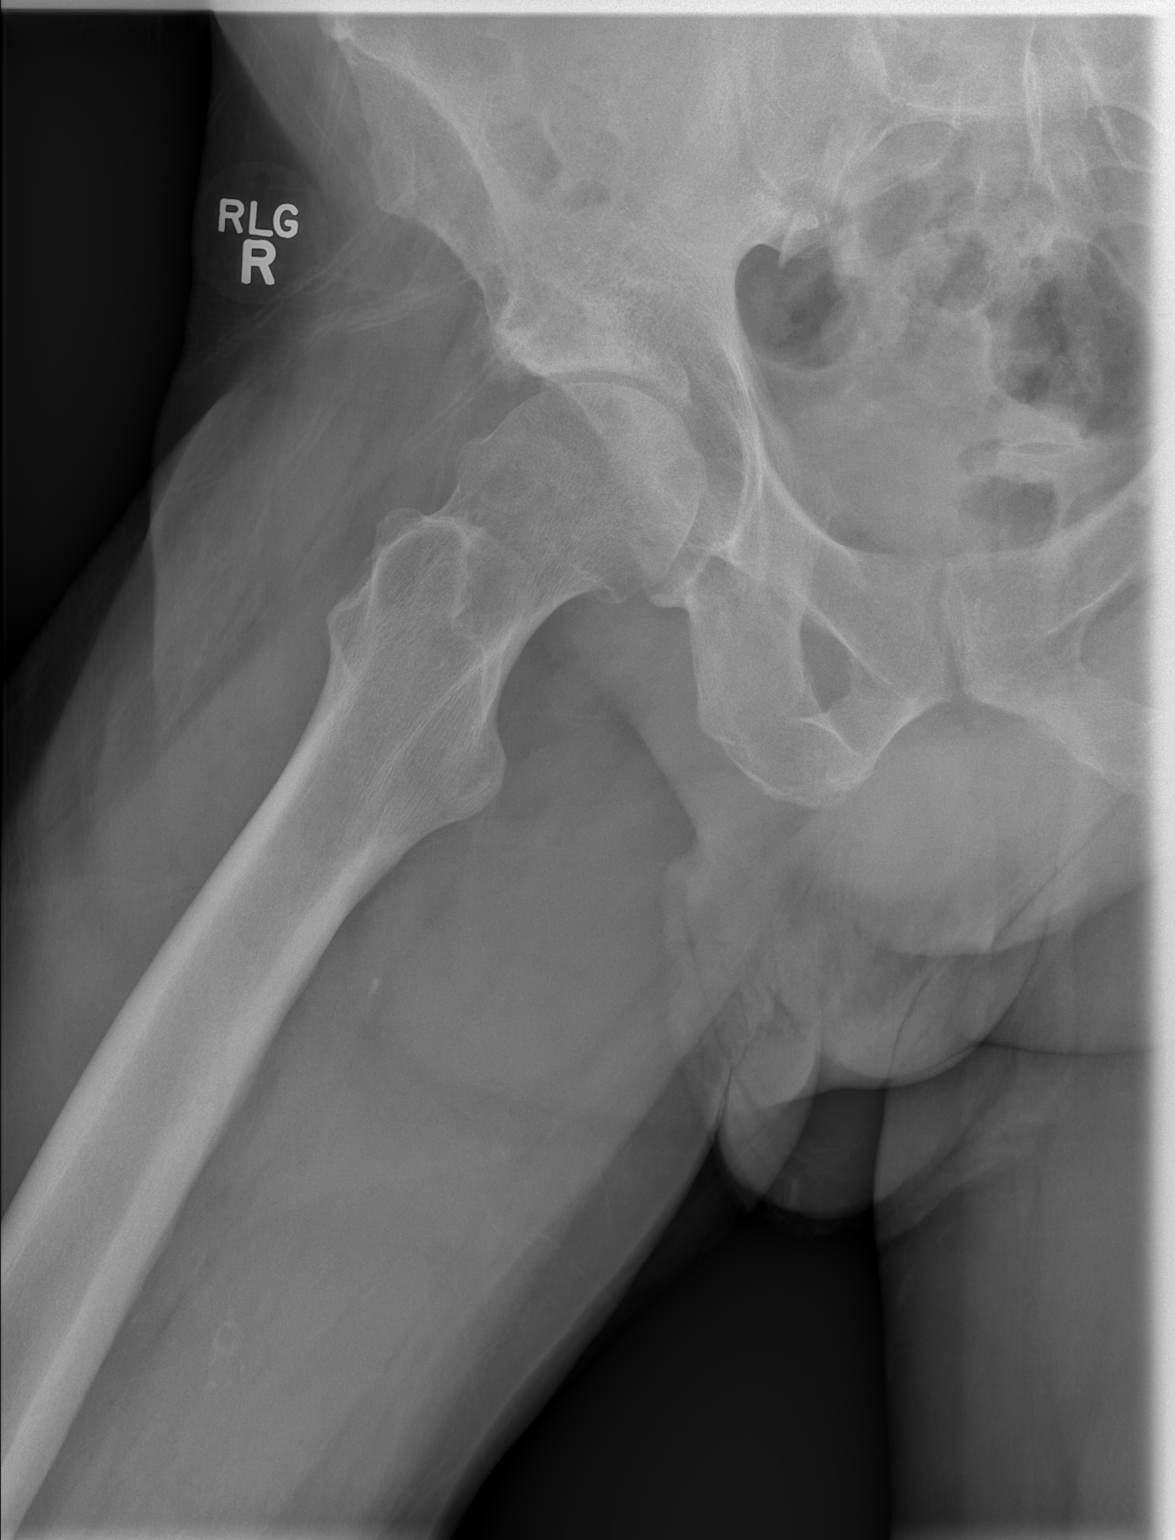

[3 of 3 positions shown; findings below may reference images not displayed]

FINDINGS: Osseous demineralization.

Degenerative changes of both hip joints with joint space narrowing
and minimal spur formation, greater on RIGHT.

Subchondral cystic changes at RIGHT acetabular roof.

SI joints symmetric and preserved.

No acute fracture, dislocation or bone destruction.

Mild degenerative disc and facet disease changes at visualized lower
lumbar spine.
IMPRESSION: Degenerative changes of both hip joints greater on RIGHT.

## 2017-04-17 ENCOUNTER — Emergency Department: Admit: 2017-04-17 | Discharge: 2017-04-18

## 2017-04-17 ENCOUNTER — Inpatient Hospital Stay: Admit: 2017-04-17 | Discharge: 2017-04-18

## 2017-04-17 DIAGNOSIS — R402142 Coma scale, eyes open, spontaneous, at arrival to emergency department: Secondary | ICD-10-CM

## 2017-04-17 DIAGNOSIS — R402362 Coma scale, best motor response, obeys commands, at arrival to emergency department: Secondary | ICD-10-CM

## 2017-04-17 DIAGNOSIS — R402252 Coma scale, best verbal response, oriented, at arrival to emergency department: Secondary | ICD-10-CM

## 2017-04-17 DIAGNOSIS — R45851 Suicidal ideations: Principal | ICD-10-CM

## 2017-04-17 DIAGNOSIS — F259 Schizoaffective disorder, unspecified: Secondary | ICD-10-CM

## 2017-04-17 DIAGNOSIS — M5440 Lumbago with sciatica, unspecified side: Secondary | ICD-10-CM

## 2017-04-17 DIAGNOSIS — M545 Low back pain: Secondary | ICD-10-CM

## 2017-04-17 DIAGNOSIS — T1490XA Injury, unspecified, initial encounter: Secondary | ICD-10-CM

## 2017-04-17 DIAGNOSIS — F209 Schizophrenia, unspecified: Secondary | ICD-10-CM

## 2017-04-17 DIAGNOSIS — R440 Auditory hallucinations: Secondary | ICD-10-CM

## 2017-04-17 DIAGNOSIS — W19XXXA Unspecified fall, initial encounter: Secondary | ICD-10-CM

## 2017-04-17 DIAGNOSIS — F250 Schizoaffective disorder, bipolar type: Principal | ICD-10-CM

## 2017-04-17 NOTE — ED Triage Notes
Pt ambulates to triage w/ steady gait and c/o hearing voices telling him to harm himself. Pt states he was at his daughters house and was seen responding to internal stimuli. He states he was asked to leave so the voices told him to jump out the window. Pt states he jumped out 2nd story window in attempt to harm himself. Pt c/o back pain and hip pain. Denies neck pain. Pt states SI. Pt states he hasn't taken medication for 1.5 weeks. Pt to [facility] for further eval.

## 2017-04-18 MED ORDER — HALOPERIDOL LACTATE 5 MG/ML IJ SOLN
2 mg | Freq: Four times a day (QID) | INTRAMUSCULAR | Status: DC | PRN
Start: 2017-04-18 — End: 2017-04-19

## 2017-04-18 MED ORDER — ACETAMINOPHEN 325 MG PO TABS
650 mg | Freq: Once | ORAL | Status: CP
Start: 2017-04-18 — End: ?

## 2017-04-18 MED ORDER — LORAZEPAM 1 MG PO TABS
1 mg | Freq: Four times a day (QID) | ORAL | Status: DC | PRN
Start: 2017-04-18 — End: 2017-04-19

## 2017-04-18 MED ORDER — LORAZEPAM 2 MG/ML IJ SOLN
1 mg | Freq: Four times a day (QID) | INTRAMUSCULAR | Status: DC
Start: 2017-04-18 — End: 2017-04-18

## 2017-04-18 MED ORDER — LORAZEPAM 1 MG PO TABS
1 mg | Freq: Four times a day (QID) | ORAL | Status: DC
Start: 2017-04-18 — End: 2017-04-18

## 2017-04-18 MED ORDER — HALOPERIDOL 2 MG/1 ML UNIT DOSE ORAL LIQD JX
2 mg | Freq: Four times a day (QID) | ORAL | Status: DC | PRN
Start: 2017-04-18 — End: 2017-04-19

## 2017-04-18 MED ORDER — LORAZEPAM 1 MG/0.5 ML UNIT DOSE ORAL LIQD JX
1 mg | Freq: Four times a day (QID) | ORAL | Status: DC | PRN
Start: 2017-04-18 — End: 2017-04-19

## 2017-04-18 MED ORDER — LORAZEPAM 2 MG/ML IJ SOLN
1 mg | Freq: Four times a day (QID) | INTRAMUSCULAR | Status: DC | PRN
Start: 2017-04-18 — End: 2017-04-19

## 2017-04-18 MED ORDER — MAALOX/HYOSCYAMINE UNIT DOSE JX
Freq: Once | ORAL | Status: CP
Start: 2017-04-18 — End: ?

## 2017-04-18 MED ORDER — HALOPERIDOL 2 MG PO TABS
2 mg | Freq: Four times a day (QID) | ORAL | Status: DC | PRN
Start: 2017-04-18 — End: 2017-04-19

## 2017-04-18 MED ORDER — LORAZEPAM 1 MG/0.5 ML UNIT DOSE ORAL LIQD JX
1 mg | Freq: Four times a day (QID) | ORAL | Status: DC
Start: 2017-04-18 — End: 2017-04-18

## 2017-04-18 NOTE — ED Provider Notes
Decide to obtain history from someone other than the patient: No    Decide to obtain previous medical records: No    Clinical Lab Test(s): N/A    Diagnostic Tests (Radiology, EKG): Ordered and Reviewed    Independent Visualization (ED US, Wet Prep, Other): Yes - Documented in ED Provider Note    Discussed patient with NON-ED Provider: Consultant      ED Disposition   ED Disposition: Transfer to Another Facility      ED Clinical Impression   ED Clinical Impression:   Suicidal ideation  Schizophrenia, unspecified type (CMS-HCC code)  Fall, initial encounter  Acute midline low back pain, with sciatica presence unspecified      ED Patient Status   Patient Status:   Fair        ED Medical Evaluation Initiated   Medical Evaluation Initiated:  Yes, filed at 04/17/17 1810  by Rosilyn Mingsiaz, Joseph Daniel, MD            Ned ClinesValentin, Joseph Vicente, MD  Resident  04/18/17 [phone]

## 2017-04-18 NOTE — ED Notes
[facility] [place] [facility]

## 2017-04-18 NOTE — ED Provider Notes
US reviewed with ED attending        Labs:  - - No data to display      Imaging (Read by ED Provider):  Per Radiology: Ct L Spine W/o Iv Con    Result Date: 04/17/2017  STUDY:  CT L SPINE W/O IV CON CLINICAL INDICATION: 67 years Male Lumbago COMPARISON: X-ray pelvis dated February 2019 TECHNIQUE: Serial transverse images of the lumbar spine were obtained without contrast administration.. Multiplanar reconstructions were performed. FINDINGS: No acute fracture or subluxation of the lumbar spine. There is normal alignment of the lumbar spine with preservation of vertebral body heights. Intervertebral disc spaces are maintained. Multilevel degenerative changes including facet arthropathy are visualized. No spondylolisthesis is noted. 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 No bony erosive changes are seen. Bibasal atelectasis within the partially visualized lung bases is noted.. There is scattered atherosclerotic calcification of the abdominal aorta and its branches. There is rectal distention secondary to the fecal retention. Mild prostatomegaly is noted.     No acute fracture or subluxation of the lumbar spine. Multilevel degenerative changes of the lumbar spine. I personally reviewed the images and the residents findings and agree with the above. Read By Valentino Nose- Jamie Ledford M.D.  Electronically Verified By Valentino Nose- Jamie Ledford M.D.  Released Date Time - 04/17/2017 9:25 PM  Resident - Dub AmisBoris Kumaev    Per Radiology: Xr Chest Single View    Result Date: 04/17/2017  PORTABLE CHEST X-RAY (SINGLE FRONTAL VIEW) Comparison: None History: Chest trauma Findings: The trachea is midline. The cardiomediastinal silhouette is within normal size limits. Prior median sternotomy and likely CABG. The lungs are clear. There is no pneumothorax or pleural effusion. There is no acute osseous abnormality.

## 2017-04-18 NOTE — ED Provider Notes
Impression: No acute posttraumatic sequela. No acute cardiopulmonary abnormality. I personally reviewed the images and the residents findings and agree with the above. Read By Valentino Nose- Jamie Ledford M.D.  Electronically Verified By Valentino Nose- Jamie Ledford M.D.  Released Date Time - 04/17/2017 9:15 PM  Resident - Floyce StakesKatherine Lambert    Per Radiology: Xr Pelvis 1 Or 2 Views    Result Date: 04/17/2017  XR PELVIS 1 OR 2 VIEWS Clinical indication:66 years Male Other injury of other sites of trunk Comparison:  None Findings: Negative for fracture or dislocation.  Right greater than left moderate to severe degenerative changes of the hip with joint space narrowing, subchondral sclerosis and osteophyte formation along the weightbearing articular surface.  Soft tissues are unremarkable.      Impression: No acute osseous abnormalities. Moderate to severe osteoarthritis of the hips, right greater than left I personally reviewed the images and the residents findings and agree with the above. Read By Valentino Nose- Jamie Ledford M.D.  Electronically Verified By Valentino Nose- Jamie Ledford M.D.  Released Date Time - 04/17/2017 9:17 PM  Resident - Floyce StakesKatherine Lambert        EKG (Read by ED Provider):  not applicable        ED Course & Re-Evaluation     ED Course as of Apr 18 102   Fri Apr 17, 2017   1905 Patient seen. VSS. NAD  Patient arrives to trauma s/p fall Jumped 2/2 auditory hallucinations.   Patient jumped from second story and landed on his feet. Patient states that the voices in his head told him to jump.   Patient denies head trauma or LOC  Complains of lower back pain.   PE: ABCD intact. GCS=15. No signs of head trauma. L-spine tendernes  EFAST negative.  [JV]   2233 IMaging negative for acute traumatic injuries.   Patient medically cleared by Trauma. Will consult ETU. Patient under BA.   [JV]   Sat Apr 18, 2017   0103 Will maintain BA. Patient will be transferred to psych facility.   [JV]      ED Course User Index  [JV] Ned ClinesValentin, Joseph Vicente, MD         MDM

## 2017-04-18 NOTE — ED Provider Notes
Cervical back: He exhibits no tenderness, no bony tenderness and no pain.        Thoracic back: He exhibits no tenderness, no bony tenderness and no pain.        Lumbar back: He exhibits bony tenderness and pain. He exhibits no tenderness.   Neurological: He is alert and oriented to person, place, and time. No sensory deficit. He exhibits normal muscle tone. Coordination normal. GCS eye subscore is 4. GCS verbal subscore is 5. GCS motor subscore is 6.   UE       R    L  Deltoid     5/5 5/5  Biceps     5/5 5/5  Triceps     5/5 5/5  Wrist extensors   5/5 5/5  Grip       5/5 5/5  Finger extensors   5/5 5/5  LE ? ?  Iliopsoas     5/5 5/5  Quadriceps    5/5 5/5  Hamstring    5/5 5/5  Tibialis anterior   5/5 5/5  Gastrocnemius   5/5 5/5     Skin: Skin is warm and dry.   Psychiatric: He has a normal mood and affect. Thought content normal.   Nursing note and vitals reviewed.      Differential DDx: L-spine fracture vs brust fracture vs chance fracture vs spinal cord injury vs PTX vs others    Is this an Emergent Medical Condition? Yes - Severe Pain/Acute Onset of Symptons  409.901 FS  641.19 FS  627.732 (16) FS    ED Workup   ED Ultrasound Performed  Date/Time: 04/17/2017 8:27 PM  Performed by: Yetta BarreVALENTIN, JOSEPH VICENTE  Authorized by: Yetta BarreVALENTIN, JOSEPH VICENTE   The ultrasound procedure performed was extended focused assessment with sonography for trauma ultrasound.   Reasons for the procedure performed included concern for intra-abdominal injury.   Checking for pneumothorax? yes.  Pericardial fluid exists? no.  Fluid in Morrison's pouch (RUQ)? no.  Fluid in splenorenal space (LUQ)? no.  Fluid in [place] pouch (pelvis)? noTechnique: FAST exam.  Patient position: supine.  Documentation: images saved on hard disk and still images obtained.  Patient tolerance: Patient tolerated the procedure well with no immediate complications    Patient tolerance: Patient tolerated the procedure well with no immediate complications.

## 2017-04-18 NOTE — ED Notes
Pt rolled maintaining spinal precautions. No step offs noted. Pt c/o L spine tenderness.

## 2017-04-18 NOTE — ED Notes
IV removed with catheter intact.  Pressure applied, Time of discharge: 1820  PM., Patient discharged to  Other Facility.  Patient discharged  ambulatory. to exit with belongings in  Stable condition.  Patient escorted by  transport., Written discharge instructions given to  patient.  Patient/recipient  verbalizes discharge instructions.

## 2017-04-18 NOTE — ED Notes
Received report from Kristie, RN

## 2017-04-18 NOTE — ED Notes
[facility] stating she read triage notes and pt jumped out of second story. Pt GCS 15. Able to make needs known. Pt states he has been off his medication for approx 10 days. Today he was talking to himself and his daughter asked him who he was talking to. He told her he was schizophrenic and that she got very upset. He said seeing her upset made him upset and then the voices told him to just jump out of the window so he did. Pt reports he landed on his feet initially and then fell backward. C/o low back pain. Pt states his daughter told him he couldn't be around his grandson if he hears voices. Pt states he loves his grandson more than anything and certainly doesn't want to die and asked his daughter to bring him to the hospital for low back pain and to get back on his medication. Pt calm and cooperative and pleasant to speak with. Resp even and unlabored. Room air. Abcd soft and nontender. Moving all extremities. Skin warm/dry/intact.

## 2017-04-18 NOTE — ED Notes
Pt rolled maintaining spinal precautions.

## 2017-04-18 NOTE — ED Notes
Report given to Monica, RN.

## 2017-04-18 NOTE — ED Provider Notes
History   No chief complaint on file.      Billy Ray 67 y.o. male  has a past medical history of Schizo affective schizophrenia who p/w CC of SI and jumping from 2nd story window.   Patient arrives to trauma s/p fall Jumped 2/2 auditory hallucinations.   Patient jumped from second story and landed on his feet. Patient states that the voices in his head told him to jump.   Patient denies head trauma or LOC  Complains of lower back pain.   No chest pain, or abdominal pain.         The history is provided by the patient.   Fall   The accident occurred less than 1 hour ago. The fall occurred while jumping from height. He fell from a height of 11 to 15 ft. He landed on a hard floor. There was no blood loss. Point of impact: feet. The pain is moderate. He was ambulatory at the scene. There was no entrapment after the fall. There was no drug use involved in the accident. There was no alcohol use involved in the accident. Pertinent negatives include no visual change, no fever, no abdominal pain, no nausea, no vomiting, no hematuria, no headaches and no loss of consciousness. He has tried nothing for the symptoms.       No Known Allergies    Patient's Medications    No medications on file       Past Medical History:   Diagnosis Date   ? Schizo affective schizophrenia (CMS-HCC code)        History reviewed. No pertinent surgical history.    No family history on file.    Social History     Social History   ? Marital status: Single     Spouse name: N/A   ? Number of children: N/A   ? Years of education: N/A     Social History Main Topics   ? Smoking status: None   ? Smokeless tobacco: None   ? Alcohol use None   ? Drug use: Unknown   ? Sexual activity: Not Asked     Other Topics Concern   ? None     Social History Narrative   ? None       Review of Systems   Constitutional: Negative for fever and chills.   HENT: Negative for nasal congestion, sore throat, nasal discharge and voice change.

## 2017-04-18 NOTE — Consults
Patient:  Billy Ray, Billy Ray  Date of Birth: [birth year]  MRN: [id number]  Date of Service:  04/18/2017    Presenting Problem/Chief Complaint:   Reportedly, Pt jumped out of a second story window because the "voices" told him to do it.  Pt continues to endorse auditory hallucinations, command type that tell him to harm himself.  Pt is new to the [place] area, he came from [place] to live with his daughter and help her with her children.  However, he has not had treatment for his Schizophrenia in a couple of months, he has been experiencing auditory hallucinations and thoughts of self harm.  His daughter has told him that he is not allowed to be with his grandchildren until he gets stabilized with psychiatric treatment.  Pt states motivation to be well because he wants to be able to spend time with his family.     Past Psychiatric History:  Past Inpatient Hospitalizations: Pt denies any previous admissions  Past Psychiatric Medications: Pt does not know the name of his medications  Outpatient Psychiatric Treatment: Not established in [place]  History of Involuntary Commitments:   History of Suicide Attempts: 04/17/2017 by jumping out of a window  Past Substance Abuse History:  Pt denies any use.   Alcohol:   Cocaine:   Marijuana  Hallucinogens:  Other substance use:  History of Dependency:   History of Drug Rehabilitation/Detox:     Psychosocial History:  Education:   Vocation:  Residence:  [place]ives with [place] daughter in [place].  Originally from [place]  Marital status:  Not Married  Caregiver support: His daughter  Patient/proxy rehab goal:   Mental Status Examination:   Pt is a 67 year old male  who presents as disheveled with fair grooming and hygiene.  Pt?s psychomotor activity is restless with good eye contact.  Pt?s speech is non elaborative.  Pt?s attitude is cooperative and pleasant.  Pt?s mood is anxious with congruent affect.  Pt is endorsing suicidal gesture/attempt by jumping out of a

## 2017-04-18 NOTE — ED Notes
[facility]. [facility]. Pt updated on plan of care. Pt verbalized thankfulness for staff.

## 2017-04-18 NOTE — ED Provider Notes
Eyes: Negative for redness and visual disturbance.   Respiratory: Negative for apnea, cough, chest tightness, shortness of breath and wheezing.    Cardiovascular: Negative for chest pain, palpitations and leg swelling.   Gastrointestinal: Negative for nausea, vomiting, abdominal pain, diarrhea, constipation, blood in stool and abdominal distention.   Genitourinary: Negative for dysuria, hematuria and difficulty urinating.   Musculoskeletal: Positive for back pain.   Skin: Negative for color change, rash and wound.   Neurological: Negative for dizziness, loss of consciousness and headaches.   Psychiatric/Behavioral: Negative for behavioral problems, agitation and anxiety.   All other systems reviewed and are negative.      Physical Exam     ED Triage Vitals [04/17/17 1745]   BP 129/79   Pulse 96   Resp 16   Temp 36.7 ?C (98.1 ?F)   Temp src Oral   Height 1.778 m   Weight 77.1 kg   SpO2 98 %   BMI (Calculated) 24.44             Physical Exam   Constitutional: He is oriented to person, place, and time. He appears well-developed and well-nourished. No distress.   HENT:   Head: Normocephalic and atraumatic.   Nose: Nose normal.   Mouth/Throat: Oropharynx is clear and moist. No oropharyngeal exudate.   Eyes: Pupils are equal, round, and reactive to light. Conjunctivae are normal. Right eye exhibits no discharge. Left eye exhibits no discharge.   Neck: Normal range of motion. Neck supple.   Cardiovascular: Normal rate, regular rhythm and intact distal pulses.  Exam reveals no friction rub.    No murmur heard.  Pulmonary/Chest: Effort normal and breath sounds normal. No stridor. No respiratory distress. He has no wheezes. He has no rales.   Abdominal: Soft. Bowel sounds are normal. He exhibits no distension. There is no tenderness. There is no rebound and no guarding.   Musculoskeletal: Normal range of motion. He exhibits tenderness. He exhibits no deformity.

## 2017-04-18 NOTE — ED Provider Notes
Decide to obtain history from someone other than the patient: No    Decide to obtain previous medical records: No    Clinical Lab Test(s): N/A    Diagnostic Tests (Radiology, EKG): Ordered and Reviewed    Independent Visualization (ED US, Wet Prep, Other): Yes - Documented in ED Provider Note    Discussed patient with NON-ED Provider: Consultant      ED Disposition   ED Disposition: Transfer to Another Facility      ED Clinical Impression   ED Clinical Impression:   Suicidal ideation  Schizophrenia, unspecified type (CMS-HCC code)  Fall, initial encounter  Acute midline low back pain, with sciatica presence unspecified  Trauma      ED Patient Status   Patient Status:   Fair        ED Medical Evaluation Initiated   Medical Evaluation Initiated:  Yes, filed at 04/17/17 1810  by Rosilyn Mingsiaz, Joseph Daniel, MD            Ned ClinesValentin, Joseph Vicente, MD  Resident  04/18/17 0105       Ned ClinesValentin, Joseph Vicente, MD  Resident  04/18/17 [phone]

## 2017-04-18 NOTE — Consults
window due to the command auditory hallucinations.   Pt?s thought processes are organized.  Pt is endorsing auditory hallucinations that Pt describes as a crowd of people in the distance talking; but, Pt cannot specifically discern what the voices are talking about.  Pt believes the auditory hallucinations want Pt to commit suicide. Pt does not endorse any homicidal ideation, plan nor attempt.  Pt makes no apparent delusional statements.  Pt appears to be actively responding to internal stimuli.  Fund of knowledge is adequate   Cognition appears intact.   Pt reports   disturbance of sleep or appetite.  There are no abnormal movements and Pt gait is normal    Pt has poor judgment and coping skills.  Pt has poor impulse control.  Pt?s safety is at risk and will likely decompensate immediately if discharged.        Diagnosis:  Axis I:   Unspecified Psychotic Disorder  Axis II:  Deferred  Axis III: Medically Cleared  Axis IV: Poor Coping Skills, Non Compliant with Recommended Treatment.  Axis V: Global assessment of Functioning: current  30  Disposition: Maintain the Baker Act and referred to inpatient psychiatry.  Updates for placement will follow.    [address]Linda [place], [place], [place], [phone]

## 2017-04-18 NOTE — Progress Notes
Pt stated that his daughter brought him here therefore she is aware he is here.   His daughter's first name is French Anaracy but he is not sure of her last name because she just got married.  His daughter left without leaving contact info.  Did research using [employer] for him and his brother, Belva CromeLawrence Bobe who lives in [place], [place].  Called several numbers based on the street name patient gave me but numbers were disconnected.  Advised patient of same and he stated that his daughter will eventually call and I suggested that he get her telephone number and write it on a piece of paper and keep it on him at all times.

## 2017-04-18 NOTE — ED Notes
Pt sitting up on bed eating tray.

## 2017-04-18 NOTE — ED Provider Notes
ZOXWRUEA:[id number]}WILDCARD:26444}    Discussed patient with NON-ED Provider: {SH ED Lamonte SakaiJX MDM - ANOTHER PROVIDER:28381}      ED Disposition   ED Disposition: No ED Disposition Set      ED Clinical Impression   ED Clinical Impression:   No Clinical Impression Set      ED Patient Status   Patient Status:   {[facility] PAT[phone]}        ED Medical Evaluation Initiated   Medical Evaluation Initiated:  Yes, filed at 04/17/17 1810  by Rosilyn Mingsiaz, Joseph Daniel, MD

## 2017-04-18 NOTE — ED Notes
Pt cleared by trauma. Pt to ECC per Dr.Duran.

## 2017-04-18 NOTE — ED Provider Notes
US reviewed with ED attending        Labs:  - - No data to display      Imaging (Read by ED Provider):  ***      EKG (Read by ED Provider):  not applicable        ED Course & Re-Evaluation     ED Course as of Apr 17 2025   Fri Apr 17, 2017   1905 Patient seen. VSS. NAD  Patient arrives to trauma s/p fall Jumped 2/2 auditory hallucinations.   Patient jumped from second story and landed on his feet. Patient states that the voices in his head told him to jump.   Patient denies head trauma or LOC  Complains of lower back pain.   PE: ABCD intact. GCS=15. No signs of head trauma. L-spine tendernes  EFAST negative.  [JV]      ED Course User Index  [JV] Ned ClinesValentin, Joseph Vicente, MD         MDM   Decide to obtain history from someone other than the patient: No    Decide to obtain previous medical records: No    Clinical Lab Test(s): N/A    Diagnostic Tests (Radiology, EKG): Ordered and Reviewed    Independent Visualization (ED US, Wet Prep, Other): Yes - Documented in ED Provider Note    Discussed patient with NON-ED Provider: {SH ED Lamonte SakaiJX MDM - ANOTHER PROVIDER:28381}      ED Disposition   ED Disposition: No ED Disposition Set      ED Clinical Impression   ED Clinical Impression:   No Clinical Impression Set      ED Patient Status   Patient Status:   {[facility] PAT[facility]:[id number]}        ED Medical Evaluation Initiated   Medical Evaluation Initiated:  Yes, filed at 04/17/17 1810  by Rosilyn Mingsiaz, Joseph Daniel, MD

## 2017-04-18 NOTE — ED Provider Notes
History   No chief complaint on file.      HPI    No Known Allergies    Patient's Medications    No medications on file       Past Medical History:   Diagnosis Date   ? Schizo affective schizophrenia (CMS-HCC code)        History reviewed. No pertinent surgical history.    No family history on file.    Social History     Social History   ? Marital status: Single     Spouse name: N/A   ? Number of children: N/A   ? Years of education: N/A     Social History Main Topics   ? Smoking status: None   ? Smokeless tobacco: None   ? Alcohol use None   ? Drug use: Unknown   ? Sexual activity: Not Asked     Other Topics Concern   ? None     Social History Narrative   ? None       Review of Systems    Physical Exam     ED Triage Vitals [04/17/17 1745]   BP 129/79   Pulse 96   Resp 16   Temp 36.7 ?C (98.1 ?F)   Temp src Oral   Height 1.778 m   Weight 77.1 kg   SpO2 98 %   BMI (Calculated) 24.44             Physical Exam    Differential DDx: ***    Is this an Emergent Medical Condition? {SH ED EMERGENT MEDICAL [phone]}  409.901 FS  641.19 FS  627.732 (16) FS    ED Workup   Procedures    Labs:  - - No data to display      Imaging (Read by ED Provider):  {Imaging [phone]}      EKG (Read by ED Provider):  {EKG findings:[phone]}        ED Course & Re-Evaluation     ED Course as of Apr 17 1904   Fri Apr 17, 2017   1905 Patient seen. VSS. NAD  Patient arrives to trauma s/p fall Jumped 2/2 auditory hallucinations.   [JV]      ED Course User Index  [JV] Ned ClinesValentin, Joseph Vicente, MD         MDM   Decide to obtain history from someone other than the patient: [facility]{SH ED Lamonte SakaiJX MDM - OBTAIN HISTORY:28378}    Decide to obtain previous medical records: [facility]{SH ED Lamonte SakaiJX MDM - PREVIOUS MED REC - NO VWU:98119}YES:28380}    Clinical Lab Test(s): {SH ED Lamonte SakaiJX MDM ORDERED AND REVIEWED:28124}    Diagnostic Tests (Radiology, EKG): {SH ED Lamonte SakaiJX MDM ORDERED AND REVIEWED:28124}    Independent Visualization (ED US, Wet Prep, Other): {SH ED Lamonte SakaiJX MDM NO YES

## 2017-04-19 NOTE — ED Attestation Note
Attestation   I discussed this patient with the resident/fellow. Procedure performed: E-FAST. I was present for the entire procedure.         I  was present and have reviewed the images with the resident during the limited e-FAST ultrasound examination as indicated for trauma and back pain. The ultrasound imaging protocol was performed as listed in the resident procedure note. I have discussed the findings with the resident and agree with the findings documented in the resident's note.  The final interpretation of those findings is/are E-FAST no sonographic evidence of free fluid and no sonographic evidence of pneumothorax.       Edmonia Lynchuran-Gehring, Petra E., MD  04/17/17 2237

## 2017-04-19 NOTE — ED Attestation Note
Attestation   I saw and evaluated the patient. I reviewed and agree with the findings and plan as documented in the note. I reviewed the patient's history. I discussed this patient with the resident/fellow. Procedure performed: E-FAST. I was present for the entire procedure.         I  was present and have reviewed the images with the resident during the limited e-FAST ultrasound examination as indicated for trauma and back pain. The ultrasound imaging protocol was performed as listed in the resident procedure note. I have discussed the findings with the resident and agree with the findings documented in the resident's note.  The final interpretation of those findings is/are E-FAST no sonographic evidence of free fluid and no sonographic evidence of pneumothorax.       Edmonia Lynchuran-Gehring, Petra E., MD  04/17/17 [id number]       Edmonia Lynchuran-Gehring, Petra E., MD  04/19/17 1736

## 2017-04-20 NOTE — Consults
Department of Surgery  Division of Acute Care Surgery  TRAUMA HISTORY AND PHYSICAL    Admission Date and Time: 04/17/2017  6:08 PM         Time in ED: 7:13 PM  Injury time/date:       5:30 PM  Transport Mode: private vehicle    Backboard: No C-collar/Immob: No         Pre Hospital Report:   Mechanism of Injury  Fall      History of present illness  Billy Ray is a [age] y.o.  male who fell from second story and landed on his feet. He states he heard a sound to tell him to jump from second story. The height of the fall was estimated to be at least 10 feet. There is not history of amnesia. he did not have LOC. The patient was reported to have a GCS of 15 in the field. He did not require intubation prior to arrival. EMS reports that the patient was hemodynamically stable in the field. Upon arrival the patient was complaining of pain in the Back. They described their pain as pain is excruciating , incapacitating and unbearable(9-10 pain scale).    SOURCE OF HISTORY:  Discussed with pre-hospital EMS: no    history obtained from patient  I have reviewed the past medical, surgical, family and social history.  Past Medical History:  Allergies: Patient has no known allergies.   Home Medications:   (Not in a hospital admission)      Past Medical History:   Diagnosis Date   ? Schizo affective schizophrenia (CMS-HCC code)      History reviewed. No pertinent surgical history.  No family history on file.   Social History     Social History   ? Marital status: Single     Spouse name: N/A   ? Number of children: N/A   ? Years of education: N/A     Social History Main Topics   ? Smoking status: None   ? Smokeless tobacco: None   ? Alcohol use None   ? Drug use: Unknown   ? Sexual activity: Not Asked     Other Topics Concern   ? None     Social History Narrative   ? None           REVIEW OF SYSTEMS  All systems are negative except for: back pain      Objective:   Vitals:   Patient Vitals for the past 8 hrs:

## 2017-04-20 NOTE — Consults
BP Temp Temp src Pulse Resp SpO2 Height Weight   04/17/17 1745 129/79 36.7 ?C (98.1 ?F) Oral 96 16 98 % 1.778 m (5\' 10" ) 77.1 kg (170 lb)        Head Normocephalic, atraumatic   Maxillofacial No tenderness   Ears tympanic membranes clear   Eyes:   Pupils pupils 3 mm, bilaterally reactive to light, extraocular muscles intact   Nose nares patent   Oral normal   Neck:   Tender absent               Step-Offs absent   Lungs: no increased work of breathing  clear bilaterally   Cardiac normal   Abdomen abdomen is soft without significant tenderness, masses, organomegaly or guarding   Back   THORACIC SPINE There is no tenderness.    LUMBAR SPINE There is tenderness midline at whole lumbar spine.    Pelvis non-tender, stable to anterior-posterior/lateral compression   Rectum: Tone deferred                  Blood deferred   Genitalia normal   Extremities normal No ankle tenderness or swelling   Vascular normal   Neuro normal GCS:  4 - Opens eyes on own   5 - Alert and oriented   6 - Follows simple motor commands  Total: 15 Intubated?: no   Skin normal       Radiology:    Per Radiology:  Ct L Spine W/o Iv Con    Result Date: 04/17/2017  No acute fracture or subluxation of the lumbar spine. Multilevel degenerative changes of the lumbar spine.     Per Radiology:  Xr Chest Single View    Result Date: 04/17/2017  Impression: No acute posttraumatic sequela. No acute cardiopulmonary abnormality.     Per Radiology:  Xr Pelvis 1 Or 2 Views    Result Date: 04/17/2017  Impression: No acute osseous abnormalities. Moderate to severe osteoarthritis of the hips, right greater than left       Procedures: E-FAST: negative    Assessment and Plan:   ASSESSMENT     Patient Active Problem List   Diagnosis   ? Fall   ? Back pain   .      Patient's condition is stable.      Plan:   CXR: WNL  Pelvic XR: no fx  CT L spine: no fx      Disposition: Transfer to Emergency Department for further care (schizophrenia)        Drucie OpitzPanumart Manatpon, MD

## 2017-04-20 NOTE — Consults
04/17/2017, 7:13 PM

## 2017-07-09 ENCOUNTER — Inpatient Hospital Stay: Admit: 2017-07-09 | Discharge: 2017-07-10

## 2017-07-09 ENCOUNTER — Emergency Department: Admit: 2017-07-09 | Discharge: 2017-07-10

## 2017-07-09 DIAGNOSIS — D72829 Elevated white blood cell count, unspecified: Secondary | ICD-10-CM

## 2017-07-09 DIAGNOSIS — F1410 Cocaine abuse, uncomplicated: Secondary | ICD-10-CM

## 2017-07-09 DIAGNOSIS — R0789 Other chest pain: Secondary | ICD-10-CM

## 2017-07-09 DIAGNOSIS — F10129 Alcohol abuse with intoxication, unspecified: Principal | ICD-10-CM

## 2017-07-09 DIAGNOSIS — F1010 Alcohol abuse, uncomplicated: Secondary | ICD-10-CM

## 2017-07-09 DIAGNOSIS — Z590 Homelessness: Secondary | ICD-10-CM

## 2017-07-09 DIAGNOSIS — F209 Schizophrenia, unspecified: Secondary | ICD-10-CM

## 2017-07-09 DIAGNOSIS — R079 Chest pain, unspecified: Secondary | ICD-10-CM

## 2017-07-09 DIAGNOSIS — R45850 Homicidal ideations: Secondary | ICD-10-CM

## 2017-07-09 DIAGNOSIS — F259 Schizoaffective disorder, unspecified: Secondary | ICD-10-CM

## 2017-07-09 DIAGNOSIS — Z79899 Other long term (current) drug therapy: Secondary | ICD-10-CM

## 2017-07-09 DIAGNOSIS — F329 Major depressive disorder, single episode, unspecified: Secondary | ICD-10-CM

## 2017-07-09 DIAGNOSIS — E878 Other disorders of electrolyte and fluid balance, not elsewhere classified: Secondary | ICD-10-CM

## 2017-07-09 DIAGNOSIS — R443 Hallucinations, unspecified: Secondary | ICD-10-CM

## 2017-07-09 DIAGNOSIS — J3489 Other specified disorders of nose and nasal sinuses: Secondary | ICD-10-CM

## 2017-07-09 DIAGNOSIS — F250 Schizoaffective disorder, bipolar type: Principal | ICD-10-CM

## 2017-07-09 MED ORDER — LORAZEPAM 2 MG/ML IJ SOLN
2 mg | Freq: Once | INTRAMUSCULAR | Status: DC
Start: 2017-07-09 — End: 2017-07-09

## 2017-07-09 MED ORDER — ZIPRASIDONE MESYLATE 20 MG IM SOLR
20 mg | Freq: Once | INTRAMUSCULAR | Status: DC
Start: 2017-07-09 — End: 2017-07-10

## 2017-07-09 MED ORDER — ASPIRIN 325 MG PO TABS
325 mg | Freq: Once | ORAL | Status: DC
Start: 2017-07-09 — End: 2017-07-10

## 2017-07-09 MED ORDER — BOLUS IV FLUID JX
Freq: Once | INTRAVENOUS | Status: CP
Start: 2017-07-09 — End: ?

## 2017-07-09 MED ORDER — MIDAZOLAM HCL 2 MG/2ML IJ SOLN
4 mg | Freq: Once | INTRAMUSCULAR | Status: DC
Start: 2017-07-09 — End: 2017-07-09

## 2017-07-09 MED ORDER — MIDAZOLAM HCL 2 MG/2ML IJ SOLN
4 mg | INTRAMUSCULAR | Status: DC | PRN
Start: 2017-07-09 — End: 2017-07-09

## 2017-07-09 MED ORDER — LORAZEPAM 2 MG/ML IJ SOLN
2 mg | Freq: Once | INTRAMUSCULAR | Status: CP
Start: 2017-07-09 — End: ?

## 2017-07-09 MED ORDER — MIDAZOLAM HCL 2 MG/2ML IJ SOLN
2 mg | INTRAMUSCULAR | Status: DC | PRN
Start: 2017-07-09 — End: 2017-07-11

## 2017-07-09 MED ORDER — LORAZEPAM 2 MG/ML IJ SOLN
2 mg | Freq: Once | INTRAVENOUS | Status: DC
Start: 2017-07-09 — End: 2017-07-09

## 2017-07-09 MED ORDER — ZIPRASIDONE HCL 20 MG PO CAPS
20 mg | Freq: Two times a day (BID) | ORAL | Status: DC
Start: 2017-07-09 — End: 2017-07-11

## 2017-07-10 DIAGNOSIS — F250 Schizoaffective disorder, bipolar type: Principal | ICD-10-CM

## 2017-12-11 DIAGNOSIS — Z5181 Encounter for therapeutic drug level monitoring: Secondary | ICD-10-CM | POA: Diagnosis not present

## 2017-12-11 DIAGNOSIS — I2510 Atherosclerotic heart disease of native coronary artery without angina pectoris: Secondary | ICD-10-CM | POA: Diagnosis not present

## 2017-12-11 DIAGNOSIS — E119 Type 2 diabetes mellitus without complications: Secondary | ICD-10-CM | POA: Diagnosis not present

## 2017-12-15 ENCOUNTER — Other Ambulatory Visit: Payer: Self-pay

## 2017-12-15 ENCOUNTER — Encounter (HOSPITAL_COMMUNITY): Payer: Self-pay

## 2017-12-15 ENCOUNTER — Emergency Department (HOSPITAL_COMMUNITY)
Admission: EM | Admit: 2017-12-15 | Discharge: 2017-12-15 | Disposition: A | Discharge status: Other Acute Inpatient Hospital | Payer: Medicare Other | Source: Home / Self Care | Attending: Emergency Medicine | Admitting: Emergency Medicine

## 2017-12-15 ENCOUNTER — Inpatient Hospital Stay (HOSPITAL_COMMUNITY)
Admission: AD | Admit: 2017-12-15 | Discharge: 2017-12-18 | DRG: 885 | Disposition: A | Discharge status: Home / Self Care | Payer: Medicare Other | Source: Intra-hospital | Attending: Psychiatry | Admitting: Psychiatry

## 2017-12-15 ENCOUNTER — Encounter (HOSPITAL_COMMUNITY): Payer: Self-pay | Admitting: Emergency Medicine

## 2017-12-15 DIAGNOSIS — F332 Major depressive disorder, recurrent severe without psychotic features: Principal | ICD-10-CM | POA: Diagnosis present

## 2017-12-15 DIAGNOSIS — E785 Hyperlipidemia, unspecified: Secondary | ICD-10-CM | POA: Diagnosis present

## 2017-12-15 DIAGNOSIS — E1151 Type 2 diabetes mellitus with diabetic peripheral angiopathy without gangrene: Secondary | ICD-10-CM | POA: Diagnosis present

## 2017-12-15 DIAGNOSIS — Z8249 Family history of ischemic heart disease and other diseases of the circulatory system: Secondary | ICD-10-CM

## 2017-12-15 DIAGNOSIS — I2510 Atherosclerotic heart disease of native coronary artery without angina pectoris: Secondary | ICD-10-CM | POA: Diagnosis present

## 2017-12-15 DIAGNOSIS — F419 Anxiety disorder, unspecified: Secondary | ICD-10-CM | POA: Diagnosis present

## 2017-12-15 DIAGNOSIS — R45851 Suicidal ideations: Secondary | ICD-10-CM | POA: Diagnosis present

## 2017-12-15 DIAGNOSIS — Z79899 Other long term (current) drug therapy: Secondary | ICD-10-CM | POA: Insufficient documentation

## 2017-12-15 DIAGNOSIS — G4700 Insomnia, unspecified: Secondary | ICD-10-CM | POA: Diagnosis present

## 2017-12-15 DIAGNOSIS — F1020 Alcohol dependence, uncomplicated: Secondary | ICD-10-CM | POA: Diagnosis present

## 2017-12-15 DIAGNOSIS — J449 Chronic obstructive pulmonary disease, unspecified: Secondary | ICD-10-CM | POA: Diagnosis present

## 2017-12-15 DIAGNOSIS — K219 Gastro-esophageal reflux disease without esophagitis: Secondary | ICD-10-CM | POA: Diagnosis present

## 2017-12-15 DIAGNOSIS — Y900 Blood alcohol level of less than 20 mg/100 ml: Secondary | ICD-10-CM | POA: Diagnosis present

## 2017-12-15 DIAGNOSIS — F4310 Post-traumatic stress disorder, unspecified: Secondary | ICD-10-CM | POA: Diagnosis present

## 2017-12-15 DIAGNOSIS — F1420 Cocaine dependence, uncomplicated: Secondary | ICD-10-CM | POA: Insufficient documentation

## 2017-12-15 DIAGNOSIS — F17210 Nicotine dependence, cigarettes, uncomplicated: Secondary | ICD-10-CM

## 2017-12-15 DIAGNOSIS — Z951 Presence of aortocoronary bypass graft: Secondary | ICD-10-CM | POA: Diagnosis not present

## 2017-12-15 DIAGNOSIS — I252 Old myocardial infarction: Secondary | ICD-10-CM

## 2017-12-15 DIAGNOSIS — F209 Schizophrenia, unspecified: Secondary | ICD-10-CM | POA: Insufficient documentation

## 2017-12-15 DIAGNOSIS — E119 Type 2 diabetes mellitus without complications: Secondary | ICD-10-CM

## 2017-12-15 DIAGNOSIS — G8929 Other chronic pain: Secondary | ICD-10-CM | POA: Diagnosis present

## 2017-12-15 DIAGNOSIS — I10 Essential (primary) hypertension: Secondary | ICD-10-CM

## 2017-12-15 LAB — URINALYSIS, ROUTINE W REFLEX MICROSCOPIC
Bilirubin Urine: NEGATIVE
Glucose, UA: NEGATIVE mg/dL
HGB URINE DIPSTICK: NEGATIVE
KETONES UR: NEGATIVE mg/dL
LEUKOCYTES UA: NEGATIVE
Nitrite: NEGATIVE
PROTEIN: 30 mg/dL — AB
Specific Gravity, Urine: 1.024 (ref 1.005–1.030)
pH: 5 (ref 5.0–8.0)

## 2017-12-15 LAB — COMPREHENSIVE METABOLIC PANEL
ALT: 20 U/L (ref 0–44)
AST: 29 U/L (ref 15–41)
Albumin: 4.1 g/dL (ref 3.5–5.0)
Alkaline Phosphatase: 126 U/L (ref 38–126)
Anion gap: 14 (ref 5–15)
BUN: 7 mg/dL — ABNORMAL LOW (ref 8–23)
CHLORIDE: 102 mmol/L (ref 98–111)
CO2: 24 mmol/L (ref 22–32)
Calcium: 10.2 mg/dL (ref 8.9–10.3)
Creatinine, Ser: 0.84 mg/dL (ref 0.61–1.24)
Glucose, Bld: 92 mg/dL (ref 70–99)
POTASSIUM: 3.1 mmol/L — AB (ref 3.5–5.1)
Sodium: 140 mmol/L (ref 135–145)
Total Bilirubin: 1.5 mg/dL — ABNORMAL HIGH (ref 0.3–1.2)
Total Protein: 7.9 g/dL (ref 6.5–8.1)

## 2017-12-15 LAB — CBC
HCT: 51.2 % (ref 39.0–52.0)
Hemoglobin: 17 g/dL (ref 13.0–17.0)
MCH: 28 pg (ref 26.0–34.0)
MCHC: 33.2 g/dL (ref 30.0–36.0)
MCV: 84.2 fL (ref 80.0–100.0)
NRBC: 0 % (ref 0.0–0.2)
PLATELETS: 266 10*3/uL (ref 150–400)
RBC: 6.08 MIL/uL — ABNORMAL HIGH (ref 4.22–5.81)
RDW: 13.9 % (ref 11.5–15.5)
WBC: 11.8 10*3/uL — AB (ref 4.0–10.5)

## 2017-12-15 LAB — RAPID URINE DRUG SCREEN, HOSP PERFORMED
Amphetamines: NOT DETECTED
BENZODIAZEPINES: NOT DETECTED
Barbiturates: NOT DETECTED
COCAINE: POSITIVE — AB
OPIATES: NOT DETECTED
Tetrahydrocannabinol: NOT DETECTED

## 2017-12-15 LAB — SALICYLATE LEVEL

## 2017-12-15 LAB — ETHANOL: Alcohol, Ethyl (B): 18 mg/dL — ABNORMAL HIGH (ref <10)

## 2017-12-15 LAB — ACETAMINOPHEN LEVEL

## 2017-12-15 MED ORDER — ONDANSETRON HCL 4 MG PO TABS: 4.0000 mg | ORAL_TABLET | Freq: Three times a day (TID) | ORAL | Status: DC | PRN

## 2017-12-15 MED ORDER — MAGNESIUM HYDROXIDE 400 MG/5ML PO SUSP: 30.0000 mL | Freq: Every day | ORAL | Status: DC | PRN

## 2017-12-15 MED ORDER — CHLORDIAZEPOXIDE HCL 25 MG PO CAPS: 25.0000 mg | ORAL_CAPSULE | Freq: Four times a day (QID) | ORAL | Status: DC | PRN

## 2017-12-15 MED ORDER — ACETAMINOPHEN 325 MG PO TABS: 650.0000 mg | ORAL_TABLET | Freq: Four times a day (QID) | ORAL | Status: DC | PRN

## 2017-12-15 MED ORDER — ALUM & MAG HYDROXIDE-SIMETH 200-200-20 MG/5ML PO SUSP
30.0000 mL | ORAL | Status: DC | PRN
  Administered 2017-12-16 – 2017-12-17 (×2): 30 mL via ORAL
  Filled 2017-12-15 (×2): qty 30

## 2017-12-15 MED ORDER — ONDANSETRON 4 MG PO TBDP: 4.0000 mg | ORAL_TABLET | Freq: Four times a day (QID) | ORAL | Status: DC | PRN

## 2017-12-15 MED ORDER — LOPERAMIDE HCL 2 MG PO CAPS: 2.0000 mg | ORAL_CAPSULE | ORAL | Status: DC | PRN

## 2017-12-15 MED ORDER — ADULT MULTIVITAMIN W/MINERALS CH
1.0000 | ORAL_TABLET | Freq: Every day | ORAL | Status: DC
  Administered 2017-12-16 – 2017-12-18 (×3): 1 via ORAL
  Filled 2017-12-15 (×5): qty 1

## 2017-12-15 MED ORDER — HYDROXYZINE HCL 25 MG PO TABS: 25.0000 mg | ORAL_TABLET | Freq: Four times a day (QID) | ORAL | Status: DC | PRN

## 2017-12-15 MED ORDER — ALUM & MAG HYDROXIDE-SIMETH 200-200-20 MG/5ML PO SUSP
30.0000 mL | Freq: Four times a day (QID) | ORAL | Status: DC | PRN
  Administered 2017-12-15: 30 mL via ORAL
  Filled 2017-12-15: qty 30

## 2017-12-15 MED ORDER — TRAZODONE HCL 50 MG PO TABS
50.0000 mg | ORAL_TABLET | Freq: Every evening | ORAL | Status: DC | PRN
  Administered 2017-12-16: 50 mg via ORAL
  Filled 2017-12-15: qty 1

## 2017-12-15 MED ORDER — VITAMIN B-1 100 MG PO TABS
100.0000 mg | ORAL_TABLET | Freq: Every day | ORAL | Status: DC
  Administered 2017-12-16 – 2017-12-18 (×3): 100 mg via ORAL
  Filled 2017-12-15 (×5): qty 1

## 2017-12-15 MED ORDER — POTASSIUM CHLORIDE CRYS ER 20 MEQ PO TBCR
40.0000 meq | EXTENDED_RELEASE_TABLET | Freq: Once | ORAL | Status: AC
  Administered 2017-12-15: 40 meq via ORAL
  Filled 2017-12-15: qty 2

## 2017-12-15 NOTE — [facility] Counselor (Signed)
TTS attempted to complete assessment, but was unable to complete assessment due to the pt not being in a room.  TTS assessment will be completed once the pt is in a room.

## 2017-12-15 NOTE — ED Notes (Signed)
Patient has signed voluntary consent and form has been faxed to [facility]
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
## 2017-12-15 NOTE — ED Triage Notes (Signed)
Pt states that he wants to hurt himself, jump off bridge or run in traffic, because his daughter will not let him see his grandson.

## 2017-12-15 NOTE — ED Notes (Signed)
Valuables placed in Valuables Envelope - nurses' desk - d/t pt may be accepted to [facility] this evening.

## 2017-12-15 NOTE — ED Notes (Signed)
Pt ambulatory to F8 - wearing burgundy scrubs - Sitter w/pt. Penni Bombard, [facility] SW, in w/pt.
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
## 2017-12-15 NOTE — Progress Notes (Signed)
Pt accepted to [facility]; room 301-2 Shuvon Rankin, NP is the accepting provider.   Dr. Jola Babinski is the attending provider.   Call report to [phone]   [facility] ED notified.    Pt is voluntary and can be transported by [facility].  Pt is scheduled to arrive at [facility] at 9:30pm.  [name], LCSW, LCAS Disposition CSW [facility] B[facility]/TTS [phone]

## 2017-12-15 NOTE — ED Notes (Signed)
Pts belongings in locker #2 

## 2017-12-15 NOTE — Tx Team (Signed)
Initial Treatment Plan 12/15/2017 11:30 PM [name] ZOX:[id number]    PATIENT STRESSORS: Loss of relationship with grandson Substance abuse   PATIENT STRENGTHS: Ability for insight Capable of independent living Motivation for treatment/growth Supportive family/friends   PATIENT IDENTIFIED PROBLEMS: Depression  Suicidal Thoughts  Insomnia                 DISCHARGE CRITERIA:  Adequate post-discharge living arrangements Improved stabilization in mood, thinking, and/or behavior Verbal commitment to aftercare and medication compliance  PRELIMINARY DISCHARGE PLAN: Return to previous living arrangement  PATIENT/FAMILY INVOLVEMENT: This treatment plan has been presented to and reviewed with the patient, Andre Rojas.  The patient and family have been given the opportunity to ask questions and make suggestions.  Curly Rim, RN 12/15/2017, 11:30 PM

## 2017-12-15 NOTE — ED Notes (Signed)
Dr Madilyn Hook aware pt's Potassium 3.1 - verbal order received for K-Dur 40mg  MEq po now. Readback for verification.

## 2017-12-15 NOTE — ED Notes (Signed)
Sitter at bedside.

## 2017-12-15 NOTE — ED Notes (Signed)
Pt given Ginger ale.

## 2017-12-16 DIAGNOSIS — R45851 Suicidal ideations: Secondary | ICD-10-CM

## 2017-12-16 DIAGNOSIS — F332 Major depressive disorder, recurrent severe without psychotic features: Principal | ICD-10-CM

## 2017-12-16 LAB — TSH: TSH: 2.256 u[IU]/mL (ref 0.350–4.500)

## 2017-12-16 LAB — LIPID PANEL
CHOLESTEROL: 180 mg/dL (ref 0–200)
HDL: 38 mg/dL — ABNORMAL LOW (ref >40)
LDL Cholesterol: 122 mg/dL — ABNORMAL HIGH (ref 0–99)
Total CHOL/HDL Ratio: 4.7 RATIO
Triglycerides: 99 mg/dL (ref <150)
VLDL: 20 mg/dL (ref 0–40)

## 2017-12-16 LAB — HEMOGLOBIN A1C
HEMOGLOBIN A1C: 6 % — AB (ref 4.8–5.6)
MEAN PLASMA GLUCOSE: 125.5 mg/dL

## 2017-12-16 MED ORDER — NICOTINE 21 MG/24HR TD PT24
21.0000 mg | MEDICATED_PATCH | Freq: Every day | TRANSDERMAL | Status: DC
  Administered 2017-12-16 – 2017-12-18 (×3): 21 mg via TRANSDERMAL
  Filled 2017-12-16 (×5): qty 1

## 2017-12-16 MED ORDER — PANTOPRAZOLE SODIUM 40 MG PO TBEC
40.0000 mg | DELAYED_RELEASE_TABLET | Freq: Every day | ORAL | Status: DC
  Administered 2017-12-16 – 2017-12-18 (×3): 40 mg via ORAL
  Filled 2017-12-16 (×5): qty 1

## 2017-12-16 MED ORDER — TRAZODONE HCL 100 MG PO TABS
100.0000 mg | ORAL_TABLET | Freq: Every evening | ORAL | Status: DC | PRN
  Administered 2017-12-17: 100 mg via ORAL
  Filled 2017-12-16: qty 1

## 2017-12-16 MED ORDER — QUETIAPINE FUMARATE 300 MG PO TABS
300.0000 mg | ORAL_TABLET | Freq: Every day | ORAL | Status: DC
  Administered 2017-12-16 – 2017-12-17 (×2): 300 mg via ORAL
  Filled 2017-12-16 (×4): qty 1

## 2017-12-16 MED ORDER — QUETIAPINE FUMARATE 50 MG PO TABS
50.0000 mg | ORAL_TABLET | Freq: Once | ORAL | Status: AC
  Administered 2017-12-16: 50 mg via ORAL
  Filled 2017-12-16: qty 1

## 2017-12-16 MED ORDER — ASPIRIN 325 MG PO TABS
325.0000 mg | ORAL_TABLET | Freq: Every day | ORAL | Status: DC
  Administered 2017-12-16 – 2017-12-18 (×3): 325 mg via ORAL
  Filled 2017-12-16 (×5): qty 1

## 2017-12-16 MED ORDER — MELOXICAM 7.5 MG PO TABS
7.5000 mg | ORAL_TABLET | Freq: Every day | ORAL | Status: DC
  Administered 2017-12-16 – 2017-12-18 (×3): 7.5 mg via ORAL
  Filled 2017-12-16 (×5): qty 1

## 2017-12-16 NOTE — BHH Group Notes (Signed)
LCSW Group Therapy Note 12/16/2017 3:05 PM  Type of Therapy/Topic: Group Therapy: Feelings about Diagnosis  Participation Level: Did Not Attend   Description of Group:  This group will allow patients to explore their thoughts and feelings about diagnoses they have received. Patients will be guided to explore their level of understanding and acceptance of these diagnoses. Facilitator will encourage patients to process their thoughts and feelings about the reactions of others to their diagnosis and will guide patients in identifying ways to discuss their diagnosis with significant others in their lives. This group will be process-oriented, with patients participating in exploration of their own experiences, giving and receiving support, and processing challenge from other group members.  Therapeutic Goals: 1. Patient will demonstrate understanding of diagnosis as evidenced by identifying two or more symptoms of the disorder 2. Patient will be able to express two feelings regarding the diagnosis 3. Patient will demonstrate their ability to communicate their needs through discussion and/or role play  Summary of Patient Progress:  Invited, chose not to attend.     Therapeutic Modalities:  Cognitive Behavioral Therapy Brief Therapy Feelings Identification    Sahirah Rudell Catalina [place] Clinical Social Worker

## 2017-12-16 NOTE — Progress Notes (Signed)
                                              Personal Development Group. Date:  12/16/2017 Time:  1600 Group Topic/Focus:  Personal Development   Participation Level:  Did not attend.
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
## 2017-12-16 NOTE — Therapy (Signed)
Occupational Therapy Group Note  Date:  12/16/2017 Time:  12:49 PM  Group Topic/Focus:  Self Esteem  Participation Level:  Minimal  Participation Quality:  Inattentive  Affect:  Flat  Cognitive:  Appropriate  Insight: Improving  Engagement in Group:  Limited  Modes of Intervention:  Activity, Discussion, Education and Socialization  Additional Comments:    S: "Relationships with other people can affect your self esteem"  O: OT tx with focus on self esteem building this date. Education given on definition of self esteem, with both causes of low and high self esteem identified. Activity given for pt to identify a positive/aspiring trait for each letter of the alphabet. Pt to work with peers to help complete activity and build positive thinking.   A: Pt presents to group with flat affect, minimally engaged and participatory throughout session. Pt contributed to self esteem discussion mention relationships affect self esteem. Pt did not complete activity, and did not engage in discussion surrounding activity.  P: Education given on self esteem and how to improve this date. Handouts and activities given to help facilitate skills when reintegrating into community.    Dalphine Handing, MSOT, OTR/L Behavioral Health OT/ Acute Relief OT  Dalphine Handing 12/16/2017, 12:49 PM
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
## 2017-12-16 NOTE — Tx Team (Signed)
Interdisciplinary Treatment and Diagnostic Plan Update  12/16/2017 Time of Session: 0830AM Andre Rojas MRN: [id number]  Principal Diagnosis: MDD, recurrent, severe without psychotic features  Secondary Diagnoses: Active Problems:   Severe recurrent major depression without psychotic features (HCC)   Current Medications:  Current Facility-Administered Medications  Medication Dose Route Frequency Provider Last Rate Last Dose  . acetaminophen (TYLENOL) tablet 650 mg  650 mg Oral Q6H PRN Lindon Romp A, NP      . alum & mag hydroxide-simeth (MAALOX/MYLANTA) 200-200-20 MG/5ML suspension 30 mL  30 mL Oral Q4H PRN Lindon Romp A, NP   30 mL at 12/16/17 0609  . chlordiazePOXIDE (LIBRIUM) capsule 25 mg  25 mg Oral Q6H PRN Lindon Romp A, NP      . hydrOXYzine (ATARAX/VISTARIL) tablet 25 mg  25 mg Oral Q6H PRN Lindon Romp A, NP      . loperamide (IMODIUM) capsule 2-4 mg  2-4 mg Oral PRN Lindon Romp A, NP      . magnesium hydroxide (MILK OF MAGNESIA) suspension 30 mL  30 mL Oral Daily PRN Lindon Romp A, NP      . multivitamin with minerals tablet 1 tablet  1 tablet Oral Daily Lindon Romp A, NP   1 tablet at 12/16/17 0758  . ondansetron (ZOFRAN-ODT) disintegrating tablet 4 mg  4 mg Oral Q6H PRN Lindon Romp A, NP      . thiamine (VITAMIN B-1) tablet 100 mg  100 mg Oral Daily Lindon Romp A, NP   100 mg at 12/16/17 0758  . traZODone (DESYREL) tablet 50 mg  50 mg Oral QHS PRN Rozetta Nunnery, NP       PTA Medications: Medications Prior to Admission  Medication Sig Dispense Refill Last Dose  . atorvastatin (LIPITOR) 80 MG tablet Take 1 tablet (80 mg total) by mouth daily at 6 PM. (Patient not taking: Reported on 12/15/2017) 30 tablet 1 Not Taking at Unknown time  . meloxicam (MOBIC) 7.5 MG tablet Take 7.5 mg by mouth daily.   12/13/2017  . metoprolol tartrate (LOPRESSOR) 25 MG tablet Take 1 tablet (25 mg total) by mouth 2 (two) times daily. (Patient not taking: Reported on 12/15/2017) 60 tablet 1 Not  Taking at Unknown time  . nicotine (NICODERM CQ - DOSED IN MG/24 HOURS) 14 mg/24hr patch Place 1 patch (14 mg total) onto the skin daily. 28 patch 0 unk  . omeprazole (PRILOSEC) 40 MG capsule Take 40 mg by mouth 2 (two) times daily.   12/13/2017  . oxyCODONE (OXY IR/ROXICODONE) 5 MG immediate release tablet Take 1-2 tablets (5-10 mg total) by mouth every 4 (four) hours as needed for severe pain. (Patient not taking: Reported on 12/15/2017) 40 tablet 0 Not Taking at Unknown time  . pantoprazole (PROTONIX) 40 MG tablet Take 1 tablet (40 mg total) by mouth daily. (Patient not taking: Reported on 12/15/2017) 30 tablet 1 Not Taking at Unknown time  . QUEtiapine (SEROQUEL) 300 MG tablet Take 300 mg by mouth at bedtime.   12/13/2017  . traZODone (DESYREL) 100 MG tablet Take 100 mg by mouth at bedtime.   12/13/2017    Patient Stressors: Loss of relationship with grandson Substance abuse  Patient Strengths: Ability for insight Capable of independent living Motivation for treatment/growth Supportive family/friends  Treatment Modalities: Medication Management, Group therapy, Case management,  1 to 1 session with clinician, Psychoeducation, Recreational therapy.   Physician Treatment Plan for Primary Diagnosis:MDD, recurrent, severe without psychotic features  Medication Management: Evaluate patient's response,  side effects, and tolerance of medication regimen.  Therapeutic Interventions: 1 to 1 sessions, Unit Group sessions and Medication administration.  Evaluation of Outcomes: Not Met  Physician Treatment Plan for Secondary Diagnosis: Active Problems:   Severe recurrent major depression without psychotic features ([place])   Medication Management: Evaluate patient's response, side effects, and tolerance of medication regimen.  Therapeutic Interventions: 1 to 1 sessions, Unit Group sessions and Medication administration.  Evaluation of Outcomes: Not Met   RN Treatment Plan for Primary Diagnosis:  MDD, recurrent, severe without psychotic features Long Term Goal(s): Knowledge of disease and therapeutic regimen to maintain health will improve  Short Term Goals: Ability to remain free from injury will improve, Ability to disclose and discuss suicidal ideas and Ability to identify and develop effective coping behaviors will improve  Medication Management: RN will administer medications as ordered by provider, will assess and evaluate patient's response and provide education to patient for prescribed medication. RN will report any adverse and/or side effects to prescribing provider.  Therapeutic Interventions: 1 on 1 counseling sessions, Psychoeducation, Medication administration, Evaluate responses to treatment, Monitor vital signs and CBGs as ordered, Perform/monitor CIWA, COWS, AIMS and Fall Risk screenings as ordered, Perform wound care treatments as ordered.  Evaluation of Outcomes: Not Met   LCSW Treatment Plan for Primary Diagnosis: MDD, recurrent, severe without psychotic features Long Term Goal(s): Safe transition to appropriate next level of care at discharge, Engage patient in therapeutic group addressing interpersonal concerns.  Short Term Goals: Engage patient in aftercare planning with referrals and resources, Facilitate patient progression through stages of change regarding substance use diagnoses and concerns and Identify triggers associated with mental health/substance abuse issues  Therapeutic Interventions: Assess for all discharge needs, 1 to 1 time with Social worker, Explore available resources and support systems, Assess for adequacy in community support network, Educate family and significant other(s) on suicide prevention, Complete Psychosocial Assessment, Interpersonal group therapy.  Evaluation of Outcomes: Not Met   Progress in Treatment: Attending groups: No Participating in groups: No. New to unit. Continuing to assess.  Taking medication as prescribed:  Yes. Toleration medication: Yes. Family/Significant other contact made: No, will contact:  family member if pt consents to collateral contact.  Patient understands diagnosis: Yes. Discussing patient identified problems/goals with staff: Yes. Medical problems stabilized or resolved: Yes. Denies suicidal/homicidal ideation: Yes. Issues/concerns per patient self-inventory: No. Other: n/a   New problem(s) identified: No, Describe:  n/a  New Short Term/Long Term Goal(s): detox, medication management for mood stabilization; elimination of SI thoughts; development of comprehensive mental wellness/sobriety plan.   Patient Goals:  "getting help for suicidal thoughts and insomnia."   Discharge Plan or Barriers: CSW assessing for appropriate referrals. [place] pamphlet, Mobile Crisis information, and AA/NA information provided to patient for additional community support and resources.   Reason for Continuation of Hospitalization: Depression Medication stabilization Suicidal ideation Withdrawal symptoms  Estimated Length of Stay: Friday, 12/18/17  Attendees: Patient: 12/16/2017 9:22 AM  Physician: Dr. Mallie Darting MD; Dr. Nancy Fetter MD 12/16/2017 9:22 AM  Nursing: Sharl Ma RN: Roderic Palau RN 12/16/2017 9:22 AM  RN Care Manager:x 12/16/2017 9:22 AM  Social Worker: Janice Norrie LCSW 12/16/2017 9:22 AM  Recreational Therapist: x 12/16/2017 9:22 AM  Other: Ricky Ala NP; Darnelle Maffucci Money NP 12/16/2017 9:22 AM  Other:  12/16/2017 9:22 AM  Other: 12/16/2017 9:22 AM    Scribe for Treatment Team: Avelina Laine, LCSW 12/16/2017 9:22 AM

## 2017-12-16 NOTE — [facility] Suicide Risk Assessment (Signed)
[facility] Admission Suicide Risk Assessment   Nursing information obtained from:  Patient Demographic factors:  Male, Age 65 or older, Unemployed Current Mental Status:  Self-harm thoughts Loss Factors:  Loss of significant relationship(Patient is unable to see his grandson) Historical Factors:  Prior suicide attempts Risk Reduction Factors:  Living with another person, especially a relative  Total Time spent with patient: 30 minutes Principal Problem: Severe recurrent major depression without psychotic features (HCC) Diagnosis:   Patient Active Problem List   Diagnosis Date Noted  . Severe recurrent major depression without psychotic features (HCC) [F33.2] 12/15/2017  . S/P CABG x 3 [Z95.1] 06/29/2015  . Coronary artery disease [I25.10] 06/26/2015  . Non-ST elevation MI (NSTEMI) (HCC) [I21.4] 06/26/2015  . Hyperlipidemia [E78.5] 06/26/2015  . Diabetes mellitus (HCC) [E11.9] 06/26/2015  . Alcohol dependence (HCC) [F10.20] 10/16/2012  . Cocaine abuse (HCC) [F14.10] 10/16/2012  . Psychosis (HCC) [F29] 10/16/2012  . Unspecified episodic mood disorder [F39] 10/16/2012   Subjective Data: Patient is seen and examined.  Patient is a 67 year old male with a reported past psychiatric history significant for schizophrenia, alcohol use disorder and cocaine use disorder who was brought to the [facility] emergency department by police secondary to suicidal ideation.  The patient stated that he is at a long-term substance abuse rehabilitation program, but became disturbed yesterday after his family would not allow him to see his grandson.  He stated his grandson means the world to him.  After she had prevented him from seeing his grandson he became acutely suicidal and decided to walk into traffic.  The patient currently lives with other adults at youth care services.  He stated he had been there for approximately a month.  He stated after getting upset yesterday he relapsed on alcohol and  cocaine.  He stated that prior to that he had not used in over 30 days.  He has a history of schizophrenia and his last psychiatric hospitalization here was in 2014.  He was started on Seroquel at that time.  He stated he currently receives Seroquel 300 mg p.o. Nightly.  He stated he also takes trazodone 100 mg p.o. nightly for sleep.  He has a history of coronary artery disease and takes a coated 325 mg a day aspirin.  He also takes meloxicam for arthritis.  He also takes Protonix for stomach protection.  He stated he heard auditory hallucinations yesterday, but they had been controlled in some time.  He stated his suicidal ideation had remitted after being able to talk to his grandson last evening.  He also has significant psychomotor agitation and I believe he has tardive dyskinesia.  He was admitted to the hospital for evaluation and stabilization.  Continued Clinical Symptoms:  Alcohol Use Disorder Identification Test Final Score (AUDIT): 5 The "Alcohol Use Disorders Identification Test", Guidelines for Use in Primary Care, Second Edition.  [employer] writer [facility]). Score between 0-7:  no or low risk or alcohol related problems. Score between 8-15:  moderate risk of alcohol related problems. Score between 16-19:  high risk of alcohol related problems. Score 20 or above:  warrants further diagnostic evaluation for alcohol dependence and treatment.   CLINICAL FACTORS:   Alcohol/Substance Abuse/Dependencies Schizophrenia:   Paranoid or undifferentiated type   Musculoskeletal: Strength & Muscle Tone: within normal limits Gait & Station: normal Patient leans: N/A  Psychiatric Specialty Exam: Physical Exam  Nursing note and vitals reviewed. Constitutional: He is oriented to person, place, and time. He appears well-developed and  well-nourished.  HENT:  Head: Normocephalic and atraumatic.  Respiratory: Effort normal.  Neurological: He is alert and oriented to person, place, and time.     ROS  Blood pressure 92/70, pulse 76, temperature 97.6 F (36.4 C), resp. rate 18, height 5' 8.9" (1.75 m), weight 73.5 kg, SpO2 100 %.Body mass index is 23.99 kg/m.  General Appearance: Disheveled  Eye Contact:  Fair  Speech:  Pressured  Volume:  Normal  Mood:  Anxious  Affect:  Congruent  Thought Process:  Coherent and Descriptions of Associations: Circumstantial  Orientation:  Full (Time, Place, and Person)  Thought Content:  Logical  Suicidal Thoughts:  No  Homicidal Thoughts:  No  Memory:  Immediate;   Fair Recent;   Fair Remote;   Fair  Judgement:  Impaired  Insight:  Lacking  Psychomotor Activity:  Increased  Concentration:  Concentration: Fair and Attention Span: Fair  Recall:  Poor  Fund of Knowledge:  Poor  Language:  Fair  Akathisia:  Negative  Handed:  Right  AIMS (if indicated):     Assets:  Desire for Improvement Housing Physical Health Resilience  ADL's:  Intact  Cognition:  WNL  Sleep:  Number of Hours: 6.25      COGNITIVE FEATURES THAT CONTRIBUTE TO RISK:  None    SUICIDE RISK:   Minimal: No identifiable suicidal ideation.  Patients presenting with no risk factors but with morbid ruminations; may be classified as minimal risk based on the severity of the depressive symptoms  PLAN OF CARE: Patient is seen and examined.  Patient is a 67 year old male with the above-stated past psychiatric history who was admitted secondary to suicidal ideation.  He will be restarted on his trazodone and Seroquel.  He will be placed on 15-minute checks.  We will collect collateral information.  His aspirin and Protonix will be restarted.  He will be monitored for withdrawal symptoms.  He will be encouraged to attend groups for coping skills.  Social work will meet with the patient both individually and in groups.  Hopefully we will be able to get him back on his medications and monitor for withdrawal symptoms.  I certify that inpatient services furnished can reasonably  be expected to improve the patient's condition.   Antonieta Pert, MD 12/16/2017, 1:39 PM

## 2017-12-16 NOTE — Progress Notes (Signed)
Pt presents with a flat affect and depressed mood. Pt endorses SI with no plan or intent. Pt endorses AH "gitter". Pt denies VH. Pt rated his depressive symptoms 5/10. No withdrawal symptoms verbalized by pt. Pt c/o chronic pain to his right ankle. Pt requested "something stronger" to take for his pain. Writer asked pt if he has a prescriptions at home for pain medications and pt stated yes but was unable to recall the name of the medication.   Medications administered as ordered per MD. Medications reviewed with pt. Verbal support provided. Pt encouraged to attend groups. 15 minute checks performed for safety.   Pt compliant with tx plan.

## 2017-12-16 NOTE — Progress Notes (Signed)
Admission Note:  67 year old male who presents Voluntary in no acute distress for the treatment of SI with a plan to walk into traffic. Pt appears flat and depressed. Pt was calm and cooperative with admission process. Pt presents with passive SI and contracts for safety upon admission. Pt states he has A/V hallucinations. Patient states he sees shadows and hear voices of people telling him to run into traffic. Skin was assessed and found to be clear of any abnormal marks and intact. PT searched and no contraband found, POC and unit policies explained and understanding verbalized. Consents obtained. Food and fluids offered, and patient declined offer. Pt had no additional questions or concerns.

## 2017-12-17 DIAGNOSIS — F419 Anxiety disorder, unspecified: Secondary | ICD-10-CM

## 2017-12-17 DIAGNOSIS — G4700 Insomnia, unspecified: Secondary | ICD-10-CM

## 2017-12-17 LAB — GLUCOSE, CAPILLARY: Glucose-Capillary: 109 mg/dL — ABNORMAL HIGH (ref 70–99)

## 2017-12-17 NOTE — [facility] Group Notes (Signed)
LCSW Group Therapy Note 12/17/2017 2:13 PM  Type of Therapy and Topic: Group Therapy: Avoiding Self-Sabotaging and Enabling Behaviors  Participation Level: Did Not Attend  Description of Group:  In this group, patients will learn how to identify obstacles, self-sabotaging and enabling behaviors, as well as: what are they, why do we do them and what needs these behaviors meet. Discuss unhealthy relationships and how to have positive healthy boundaries with those that sabotage and enable. Explore aspects of self-sabotage and enabling in yourself and how to limit these self-destructive behaviors in everyday life.  Therapeutic Goals: 1. Patient will identify one obstacle that relates to self-sabotage and enabling behaviors 2. Patient will identify one personal self-sabotaging or enabling behavior they did prior to admission 3. Patient will state a plan to change the above identified behavior 4. Patient will demonstrate ability to communicate their needs through discussion and/or role play.   Summary of Patient Progress:  Invited, chose not to attend.     Therapeutic Modalities:  Cognitive Behavioral Therapy Person-Centered Therapy Motivational Interviewing   Baldo Daub LCSWA Clinical Social Worker

## 2017-12-17 NOTE — Progress Notes (Signed)
D    Pt appears depressed and sad    He requested sleep medication and medication for heartburn   He has isolated much of the evening and has limited interaction with staff and peers  A   Medications administered and effectiveness monitored   Dietary teaching regarding decreasing heartburn    Verbal support and encouragement given   Q 15 min checks for safety R   Pt remains safe and verbalized understanding of dietary teaching

## 2017-12-17 NOTE — Progress Notes (Signed)
Patient ID: Andre Rojas, male   DOB: [birth year], 67 y.o.   MRN: [id number]  D: Pt with blunted affect, depressed mood, observed to be sitting quietly and keeping to himself in the day room earlier in the shift.  Pt denied SI/HI/AVH.  A: Pt is being monitored via Q15 minute checks for safety, and has been given all meds as scheduled.  R: Will continue to monitor via Q15 minute safety checks.

## 2017-12-17 NOTE — Progress Notes (Signed)
Pt did not attend wrap-up group   

## 2017-12-17 NOTE — [facility] Suicide Risk Assessment (Signed)
[facility] INPATIENT:  Family/Significant Other Suicide Prevention Education  Suicide Prevention Education:  Patient Refusal for Family/Significant Other Suicide Prevention Education: The patient Andre Rojas has refused to provide written consent for family/significant other to be provided Family/Significant Other Suicide Prevention Education during admission and/or prior to discharge.  Physician notified.  SPE completed with patient, as patient refused to consent to family contact. Patient was encouraged to share information with support network, ask questions, and talk about any concerns relating to SPE. Patient denies access to guns/firearms and verbalized understanding of information provided. Mobile Crisis information also provided to patient.    Maeola Sarah 12/17/2017, 2:43 PM

## 2017-12-17 NOTE — [facility] Group Notes (Signed)
Pt was invited but did not attend orientation/goals group facilitated by MHT Jerome B. 

## 2017-12-17 NOTE — Progress Notes (Signed)
D:  Patient denied SI and HI while talking to nurse, contracts for safety.  Denied A/V hallucinations.  Denied pain. A:  Medications administered per MD orders.  Emotional support and encouragement given patient. R:  Safety maintained with 15 minute checks.  

## 2017-12-17 NOTE — Progress Notes (Signed)
Pt dd not attend wrap up group.

## 2017-12-17 NOTE — Progress Notes (Signed)
[facility] MD Progress Note  12/17/2017 1:01 PM Andre Rojas  MRN:  [id number]   Subjective: Patient states today that he is ready to be returning to his group home.  He denies any suicidal or homicidal ideations.  He denies any hallucinations.  He reports that he was allowed to talk to his grandchildren and now he feels much better and is ready to return.  He reports that he is talked to the "head guy" and he is allowed her to return to the group home.  He denies any medication side effects.  He denies any withdrawal symptoms at this time.  He reports sleeping well and having a good appetite.  Objective: Patient's chart and findings reviewed and discussed with treatment team.  Patient presents in his room lying in the bed but is awake.  He is pleasant, calm, and cooperative.  Spoke with CSW and she is in the process of contacting patient's group home to verify the patient can return back.  There is a potential the patient may be discharged later this evening, however, there have been no confirmations for him to return to more than likely the plan will be to discharge tomorrow.  Patient has been seen in the day room interacting with peers and staff appropriately.  Patient has been attending some groups as well.   Principal Problem: Severe recurrent major depression without psychotic features (HCC) Diagnosis:   Patient Active Problem List   Diagnosis Date Noted  . Severe recurrent major depression without psychotic features (HCC) [F33.2] 12/15/2017  . S/P CABG x 3 [Z95.1] 06/29/2015  . Coronary artery disease [I25.10] 06/26/2015  . Non-ST elevation MI (NSTEMI) (HCC) [I21.4] 06/26/2015  . Hyperlipidemia [E78.5] 06/26/2015  . Diabetes mellitus (HCC) [E11.9] 06/26/2015  . Alcohol dependence (HCC) [F10.20] 10/16/2012  . Cocaine abuse (HCC) [F14.10] 10/16/2012  . Psychosis (HCC) [F29] 10/16/2012  . Unspecified episodic mood disorder [F39] 10/16/2012   Total Time spent with patient: 20 minutes  Past  Psychiatric History: See H&P  Past Medical History:  Past Medical History:  Diagnosis Date  . Acid reflux   . Anemia   . Arthritis    hip, right  . CAD (coronary artery disease)   . Chest pain   . Chronic back pain   . COPD (chronic obstructive pulmonary disease) (HCC)   . Diabetes (HCC)   . Erectile dysfunction   . Hyperlipidemia   . Hypertension   . Peripheral vascular disease (HCC)    right leg stays cold  . Pneumonia     Past Surgical History:  Procedure Laterality Date  . CARDIAC CATHETERIZATION  05/2015  . COLONOSCOPY    . CORONARY ARTERY BYPASS GRAFT N/A 06/29/2015   Procedure: CORONARY ARTERY BYPASS GRAFTING (CABG) TIMES THREE USING LEFT INTERNAL MAMMARY ARTERY AND RIGHT GREATER SAPHENOUS VEIN HARVESTED BY ENDOVEIN;  Surgeon: Loreli Slot, MD;  Location: [facility] OR;  Service: Open Heart Surgery;  Laterality: N/A;  . KNEE SURGERY Left   . TEE WITHOUT CARDIOVERSION N/A 06/29/2015   Procedure: TRANSESOPHAGEAL ECHOCARDIOGRAM (TEE);  Surgeon: Loreli Slot, MD;  Location: [facility] OR;  Service: Open Heart Surgery;  Laterality: N/A;   Family History:  Family History  Problem Relation Age of Onset  . Cancer Mother        stomach  . Heart disease Father    Family Psychiatric  History: See H&P Social History:  Social History   Substance and Sexual Activity  Alcohol Use Yes   Comment: 1/5 per day -  Pt states he may only have one or 2 drinks if he drinks any (noted 06/27/15)     Social History   Substance and Sexual Activity  Drug Use Yes  . Types: "Crack" cocaine, Cocaine   Comment: yesterday     Social History   Socioeconomic History  . Marital status: Single    Spouse name: Not on file  . Number of children: Not on file  . Years of education: Not on file  . Highest education level: Not on file  Occupational History  . Not on file  Social Needs  . Financial resource strain: Not on file  . Food insecurity:    Worry: Not on file    Inability: Not on  file  . Transportation needs:    Medical: Not on file    Non-medical: Not on file  Tobacco Use  . Smoking status: Current Every Day Smoker    Packs/day: 0.25    Years: 40.00    Pack years: 10.00    Types: Cigarettes  . Smokeless tobacco: Never Used  Substance and Sexual Activity  . Alcohol use: Yes    Comment: 1/5 per day - Pt states he may only have one or 2 drinks if he drinks any (noted 06/27/15)  . Drug use: Yes    Types: "Crack" cocaine, Cocaine    Comment: yesterday   . Sexual activity: Yes    Birth control/protection: Condom  Lifestyle  . Physical activity:    Days per week: Not on file    Minutes per session: Not on file  . Stress: Not on file  Relationships  . Social connections:    Talks on phone: Not on file    Gets together: Not on file    Attends religious service: Not on file    Active member of club or organization: Not on file    Attends meetings of clubs or organizations: Not on file    Relationship status: Not on file  Other Topics Concern  . Not on file  Social History Narrative  . Not on file   Additional Social History:                         Sleep: Good  Appetite:  Good  Current Medications: Current Facility-Administered Medications  Medication Dose Route Frequency Provider Last Rate Last Dose  . acetaminophen (TYLENOL) tablet 650 mg  650 mg Oral Q6H PRN Nira Conn A, NP      . alum & mag hydroxide-simeth (MAALOX/MYLANTA) 200-200-20 MG/5ML suspension 30 mL  30 mL Oral Q4H PRN Nira Conn A, NP   30 mL at 12/16/17 0609  . aspirin tablet 325 mg  325 mg Oral Daily Antonieta Pert, MD   325 mg at 12/17/17 0827  . hydrOXYzine (ATARAX/VISTARIL) tablet 25 mg  25 mg Oral Q6H PRN Nira Conn A, NP      . loperamide (IMODIUM) capsule 2-4 mg  2-4 mg Oral PRN Nira Conn A, NP      . magnesium hydroxide (MILK OF MAGNESIA) suspension 30 mL  30 mL Oral Daily PRN Nira Conn A, NP      . meloxicam (MOBIC) tablet 7.5 mg  7.5 mg Oral Daily  Antonieta Pert, MD   7.5 mg at 12/17/17 0829  . multivitamin with minerals tablet 1 tablet  1 tablet Oral Daily Nira Conn A, NP   1 tablet at 12/17/17 0827  . nicotine (NICODERM CQ -  dosed in mg/24 hours) patch 21 mg  21 mg Transdermal Daily Antonieta Pert, MD   21 mg at 12/17/17 1610  . ondansetron (ZOFRAN-ODT) disintegrating tablet 4 mg  4 mg Oral Q6H PRN Nira Conn A, NP      . pantoprazole (PROTONIX) EC tablet 40 mg  40 mg Oral Daily Antonieta Pert, MD   40 mg at 12/17/17 9604  . QUEtiapine (SEROQUEL) tablet 300 mg  300 mg Oral QHS Antonieta Pert, MD   300 mg at 12/16/17 2113  . thiamine (VITAMIN B-1) tablet 100 mg  100 mg Oral Daily Nira Conn A, NP   100 mg at 12/17/17 0827  . traZODone (DESYREL) tablet 100 mg  100 mg Oral QHS PRN Antonieta Pert, MD      . traZODone (DESYREL) tablet 50 mg  50 mg Oral QHS PRN Jackelyn Poling, NP   50 mg at 12/16/17 2115    Lab Results:  Results for orders placed or performed during the hospital encounter of 12/15/17 (from the past 48 hour(s))  Hemoglobin A1c     Status: Abnormal   Collection Time: 12/16/17  6:03 AM  Result Value Ref Range   Hgb A1c MFr Bld 6.0 (H) 4.8 - 5.6 %    Comment: (NOTE) Pre diabetes:          5.7%-6.4% Diabetes:              >6.4% Glycemic control for   <7.0% adults with diabetes    Mean Plasma Glucose 125.5 mg/dL    Comment: Performed at [facility], [address], [place], [place] [address]  Lipid panel     Status: Abnormal   Collection Time: 12/16/17  6:03 AM  Result Value Ref Range   Cholesterol 180 0 - 200 mg/dL   Triglycerides 99 <119 mg/dL   HDL 38 (L) >14 mg/dL   Total CHOL/HDL Ratio 4.7 RATIO   VLDL 20 0 - 40 mg/dL   LDL Cholesterol 782 (H) 0 - 99 mg/dL    Comment:        Total Cholesterol/HDL:CHD Risk Coronary Heart Disease Risk Table                     Men   Women  1/2 Average Risk   3.4   3.3  Average Risk       5.0   4.4  2 X Average Risk   9.6   7.1  3 X Average  Risk  23.4   11.0        Use the calculated Patient Ratio above and the CHD Risk Table to determine the patient's CHD Risk.        ATP III CLASSIFICATION (LDL):  <100     mg/dL   Optimal  956-213  mg/dL   Near or Above                    Optimal  130-159  mg/dL   Borderline  086-578  mg/dL   High  >469     mg/dL   Very High Performed at [facility], [address], [place], [place] [address]   TSH     Status: None   Collection Time: 12/16/17  6:03 AM  Result Value Ref Range   TSH 2.256 0.350 - 4.500 uIU/mL    Comment: Performed by a 3rd Generation assay with a functional sensitivity of <=0.01 uIU/mL. Performed  at [facility], [address] [place], [place] [address]   Glucose, capillary     Status: Abnormal   Collection Time: 12/17/17  6:07 AM  Result Value Ref Range   Glucose-Capillary 109 (H) 70 - 99 mg/dL   Comment 1 Notify RN    Comment 2 Document in Chart     Blood Alcohol level:  Lab Results  Component Value Date   ETH 18 (H) 12/15/2017   ETH <11 10/14/2012    Metabolic Disorder Labs: Lab Results  Component Value Date   HGBA1C 6.0 (H) 12/16/2017   MPG 125.5 12/16/2017   MPG 128 06/27/2015   No results found for: PROLACTIN Lab Results  Component Value Date   CHOL 180 12/16/2017   TRIG 99 12/16/2017   HDL 38 (L) 12/16/2017   CHOLHDL 4.7 12/16/2017   VLDL 20 12/16/2017   LDLCALC 122 (H) 12/16/2017    Physical Findings: AIMS:  , ,  ,  ,    CIWA:  CIWA-Ar Total: 1 COWS:     Musculoskeletal: Strength & Muscle Tone: within normal limits Gait & Station: normal Patient leans: N/A  Psychiatric Specialty Exam: Physical Exam  Nursing note and vitals reviewed. Constitutional: He is oriented to person, place, and time. He appears well-developed and well-nourished.  Cardiovascular: Normal rate.  Respiratory: Effort normal.  Musculoskeletal: Normal range of motion.  Neurological: He is alert and oriented to  person, place, and time.  Skin: Skin is warm.    Review of Systems  Constitutional: Negative.   HENT: Negative.   Eyes: Negative.   Respiratory: Negative.   Cardiovascular: Negative.   Gastrointestinal: Negative.   Genitourinary: Negative.   Musculoskeletal: Negative.   Skin: Negative.   Neurological: Negative.   Endo/Heme/Allergies: Negative.   Psychiatric/Behavioral: Negative.     Blood pressure (!) 107/59, pulse 69, temperature 97.6 F (36.4 C), resp. rate 18, height 5' 8.9" (1.75 m), weight 73.5 kg, SpO2 100 %.Body mass index is 23.99 kg/m.  General Appearance: Casual  Eye Contact:  Good  Speech:  Clear and Coherent and Normal Rate  Volume:  Normal  Mood:  Euthymic  Affect:  Congruent  Thought Process:  Goal Directed and Descriptions of Associations: Intact  Orientation:  Full (Time, Place, and Person)  Thought Content:  WDL  Suicidal Thoughts:  No  Homicidal Thoughts:  No  Memory:  Immediate;   Good Recent;   Good Remote;   Good  Judgement:  Fair  Insight:  Fair  Psychomotor Activity:  Normal  Concentration:  Concentration: Good and Attention Span: Good  Recall:  Good  Fund of Knowledge:  Good  Language:  Good  Akathisia:  No  Handed:  Right  AIMS (if indicated):     Assets:  Communication Skills Desire for Improvement Financial Resources/Insurance Housing Physical Health Social Support Transportation  ADL's:  Intact  Cognition:  WNL  Sleep:  Number of Hours: 5.75   Problems addressed MDD severe recurrent without psychotic features  Treatment Plan Summary: Daily contact with patient to assess and evaluate symptoms and progress in treatment, Medication management and Plan is to: Continue Vistaril 25 mg p.o. every 6 hours as needed for anxiety Continue Seroquel 300 mg p.o. nightly for mood stability Continue trazodone 100 mg p.o. nightly as needed for insomnia Encourage group therapy participation Plan is for patient to discharge back to group home  tomorrow  Maryfrances Bunnell, FNP 12/17/2017, 1:01 PM

## 2017-12-18 LAB — GLUCOSE, CAPILLARY: GLUCOSE-CAPILLARY: 107 mg/dL — AB (ref 70–99)

## 2017-12-18 MED ORDER — HYDROXYZINE HCL 25 MG PO TABS: 25.0000 mg | ORAL_TABLET | Freq: Four times a day (QID) | ORAL | 0 refills | Status: DC | PRN

## 2017-12-18 MED ORDER — TRAZODONE HCL 100 MG PO TABS: 100.0000 mg | ORAL_TABLET | Freq: Every evening | ORAL | 0 refills | Status: DC | PRN

## 2017-12-18 MED ORDER — QUETIAPINE FUMARATE 300 MG PO TABS: 300.0000 mg | ORAL_TABLET | Freq: Every day | ORAL | 0 refills | Status: DC

## 2017-12-18 MED ORDER — NICOTINE 21 MG/24HR TD PT24: 21.0000 mg | MEDICATED_PATCH | Freq: Every day | TRANSDERMAL | 0 refills | Status: DC

## 2017-12-18 NOTE — Progress Notes (Signed)
Pt completed his dc plans with treatment team, completes his daily assessment and on this he wrote he denied SI today and he rated his depression, hopelessness and anxeity " 2/1/3", respectively. He is given 2 [employer] bus tidakets from social work and his [employer] were reviewed with him by this Clinical research associate. HE stated " yes mamm" in verfication that he understook these instructions and will comply and he is given cc of these instructions ( SRA, AVS, SSP and transition record). All belongings from his locker were returned to him and he was then escorted to bldg entrance and Clinical research associate gave him instuctions on how to get to [employer] bus stop. Pt dc'd ambulatory.

## 2017-12-18 NOTE — Discharge Summary (Signed)
Physician Discharge Summary Note  Patient:  Andre Rojas is an 67 y.o., male MRN:  [id number] DOB:  1950/04/06 Patient phone:  [phone] (home)  Patient address:   [address] [place] [address],  Total Time spent with patient: 20 minutes  Date of Admission:  12/15/2017 Date of Discharge: 12/18/17  Reason for Admission:  Worsening depression with SI  Principal Problem: Severe recurrent major depression without psychotic features [facility]) Discharge Diagnoses: Patient Active Problem List   Diagnosis Date Noted  . Severe recurrent major depression without psychotic features (HCC) [F33.2] 12/15/2017  . S/P CABG x 3 [Z95.1] 06/29/2015  . Coronary artery disease [I25.10] 06/26/2015  . Non-ST elevation MI (NSTEMI) (HCC) [I21.4] 06/26/2015  . Hyperlipidemia [E78.5] 06/26/2015  . Diabetes mellitus (HCC) [E11.9] 06/26/2015  . Alcohol dependence (HCC) [F10.20] 10/16/2012  . Cocaine abuse (HCC) [F14.10] 10/16/2012  . Psychosis (HCC) [F29] 10/16/2012  . Unspecified episodic mood disorder [F39] 10/16/2012    Past Psychiatric History: inpatient in the past in 2014 for SA at [facility]. The pt lives with 40 other guys at [facility]  Past Medical History:  Past Medical History:  Diagnosis Date  . Acid reflux   . Anemia   . Arthritis    hip, right  . CAD (coronary artery disease)   . Chest pain   . Chronic back pain   . COPD (chronic obstructive pulmonary disease) (HCC)   . Diabetes (HCC)   . Erectile dysfunction   . Hyperlipidemia   . Hypertension   . Peripheral vascular disease (HCC)    right leg stays cold  . Pneumonia     Past Surgical History:  Procedure Laterality Date  . CARDIAC CATHETERIZATION  05/2015  . COLONOSCOPY    . CORONARY ARTERY BYPASS GRAFT N/A 06/29/2015   Procedure: CORONARY ARTERY BYPASS GRAFTING (CABG) TIMES THREE USING LEFT INTERNAL MAMMARY ARTERY AND RIGHT GREATER SAPHENOUS VEIN HARVESTED BY ENDOVEIN;  Surgeon: Loreli Slot, MD;   Location: [facility] OR;  Service: Open Heart Surgery;  Laterality: N/A;  . KNEE SURGERY Left   . TEE WITHOUT CARDIOVERSION N/A 06/29/2015   Procedure: TRANSESOPHAGEAL ECHOCARDIOGRAM (TEE);  Surgeon: Loreli Slot, MD;  Location: [facility] OR;  Service: Open Heart Surgery;  Laterality: N/A;   Family History:  Family History  Problem Relation Age of Onset  . Cancer Mother        stomach  . Heart disease Father    Family Psychiatric  History: Denies Social History:  Social History   Substance and Sexual Activity  Alcohol Use Yes   Comment: 1/5 per day - Pt states he may only have one or 2 drinks if he drinks any (noted 06/27/15)     Social History   Substance and Sexual Activity  Drug Use Yes  . Types: "Crack" cocaine, Cocaine   Comment: yesterday     Social History   Socioeconomic History  . Marital status: Single    Spouse name: Not on file  . Number of children: Not on file  . Years of education: Not on file  . Highest education level: Not on file  Occupational History  . Not on file  Social Needs  . Financial resource strain: Not on file  . Food insecurity:    Worry: Not on file    Inability: Not on file  . Transportation needs:    Medical: Not on file    Non-medical: Not on file  Tobacco Use  . Smoking status: Current  Every Day Smoker    Packs/day: 0.25    Years: 40.00    Pack years: 10.00    Types: Cigarettes  . Smokeless tobacco: Never Used  Substance and Sexual Activity  . Alcohol use: Yes    Comment: 1/5 per day - Pt states he may only have one or 2 drinks if he drinks any (noted 06/27/15)  . Drug use: Yes    Types: "Crack" cocaine, Cocaine    Comment: yesterday   . Sexual activity: Yes    Birth control/protection: Condom  Lifestyle  . Physical activity:    Days per week: Not on file    Minutes per session: Not on file  . Stress: Not on file  Relationships  . Social connections:    Talks on phone: Not on file    Gets together: Not on file    Attends  religious service: Not on file    Active member of club or organization: Not on file    Attends meetings of clubs or organizations: Not on file    Relationship status: Not on file  Other Topics Concern  . Not on file  Social History Narrative  . Not on file    Hospital Course:   12/16/17 [facility] MD Assessment: 67 y.o.male.The pt came in after having suicidal thoughts of wanting to kill himself. The pt has a plan to walk out into traffic. The pt stated he walked into traffic 2 years ago and the car stopped. He has not thought about which road he would walk on, but stated it would be a busy road. The pt stated he is upset because his daughter doesn't allow him to see his grandson anymore. The pt is currently not seeing a counselor, but sees a psychiatrist by the name of "Mrs. Lassiter" on [address]. The pt has been inpatient in the past in 2014 for SA at [facility]. The pt lives with 40 other guys at [facility]. His last day there will be next week. The pt denies self harm, HI and access to a gun. The pt denies legal issues and history of abuse. The pt stated he sees shadows and hear voices of people telling him to run into traffic. The pt stated he is sleeping about 8 hours a night when he is on his medication. The pt has a good appetite. The pt UDS is positive for cocaine and negative for all other substances. The pt stated he drinks about 3 beers a day. The pt's blood alcohol level was 18 when he arrived in the ED. The pt's longest period of sobriety is 5 years. He denied going to treatment and stated, "I just stopped". Evaluation: patient seen sitting in dayroom interacting with peers. Reports this was the first suicide  attempt due to he was angry with his daughter. Reports he had been off is medication for a while. States he was prescribed Seroquel in [place], for auditory  and visual hallunications. Currently denying suicidal or homicidal ideations. Patient validates thea  above information. See SRA for medications adjustments. Support, encouragement and reassurance was provided.  Patient remained on the [facility] unit for 2 days. The patient stabilized on medication and therapy. Patient was discharged on Seroquel 300 mg QHS, Trazodone 100 mg QHS PRN, and Vistaril 25 mg Q6H PRN. Patient has shown improvement with improved mood, affect, sleep, appetite, and interaction. Patient has attended group and participated. Patient has been seen in the day room interacting with peers and staff appropriately. Patient  denies any SI/HI/AVH and contracts for safety. Patient agrees to follow up at [facility]. Patient is provided with prescriptions for their medications upon discharge.   Physical Findings: AIMS: Facial and Oral Movements Muscles of Facial Expression: None, normal Lips and Perioral Area: None, normal Jaw: None, normal Tongue: None, normal,Extremity Movements Upper (arms, wrists, hands, fingers): None, normal Lower (legs, knees, ankles, toes): None, normal, Trunk Movements Neck, shoulders, hips: None, normal, Overall Severity Severity of abnormal movements (highest score from questions above): None, normal Incapacitation due to abnormal movements: None, normal Patient's awareness of abnormal movements (rate only patient's report): No Awareness, Dental Status Current problems with teeth and/or dentures?: No Does patient usually wear dentures?: No  CIWA:  CIWA-Ar Total: 1 COWS:  COWS Total Score: 1  Musculoskeletal: Strength & Muscle Tone: within normal limits Gait & Station: normal Patient leans: N/A  Psychiatric Specialty Exam: Physical Exam  Nursing note and vitals reviewed. Constitutional: He is oriented to person, place, and time. He appears well-developed and well-nourished.  Cardiovascular: Normal rate.  Respiratory: Effort normal.  Musculoskeletal: Normal range of motion.  Neurological: He is alert and oriented to person, place, and time.  Skin: Skin is  warm.    Review of Systems  Constitutional: Negative.   HENT: Negative.   Eyes: Negative.   Respiratory: Negative.   Cardiovascular: Negative.   Gastrointestinal: Negative.   Genitourinary: Negative.   Musculoskeletal: Negative.   Skin: Negative.   Neurological: Negative.   Endo/Heme/Allergies: Negative.   Psychiatric/Behavioral: Negative.     Blood pressure 100/64, pulse 90, temperature 98.3 F (36.8 C), temperature source Oral, resp. rate 16, height 5' 8.9" (1.75 m), weight 73.5 kg, SpO2 100 %.Body mass index is 23.99 kg/m.  General Appearance: Casual  Eye Contact:  Good  Speech:  Clear and Coherent and Normal Rate  Volume:  Normal  Mood:  Euthymic  Affect:  Congruent  Thought Process:  Goal Directed and Descriptions of Associations: Intact  Orientation:  Full (Time, Place, and Person)  Thought Content:  WDL  Suicidal Thoughts:  No  Homicidal Thoughts:  No  Memory:  Immediate;   Good Recent;   Good Remote;   Good  Judgement:  Fair  Insight:  Fair  Psychomotor Activity:  Normal  Concentration:  Concentration: Good and Attention Span: Good  Recall:  Good  Fund of Knowledge:  Good  Language:  Good  Akathisia:  No  Handed:  Right  AIMS (if indicated):     Assets:  Communication Skills Desire for Improvement Financial Resources/Insurance Housing Physical Health Social Support Transportation  ADL's:  Intact  Cognition:  WNL  Sleep:  Number of Hours: 6.5        Has this patient used any form of tobacco in the last 30 days? (Cigarettes, Smokeless Tobacco, Cigars, and/or Pipes) Yes, Yes, A prescription for an FDA-approved tobacco cessation medication was offered at discharge and the patient refused  Blood Alcohol level:  Lab Results  Component Value Date   ETH 18 (H) 12/15/2017   ETH <11 10/14/2012    Metabolic Disorder Labs:  Lab Results  Component Value Date   HGBA1C 6.0 (H) 12/16/2017   MPG 125.5 12/16/2017   MPG 128 06/27/2015   No results found  for: PROLACTIN Lab Results  Component Value Date   CHOL 180 12/16/2017   TRIG 99 12/16/2017   HDL 38 (L) 12/16/2017   CHOLHDL 4.7 12/16/2017   VLDL 20 12/16/2017   LDLCALC 122 (H) 12/16/2017    See  Psychiatric Specialty Exam and Suicide Risk Assessment completed by Attending Physician prior to discharge.  Discharge destination:  Home  Is patient on multiple antipsychotic therapies at discharge:  No   Has Patient had three or more failed trials of antipsychotic monotherapy by history:  No  Recommended Plan for Multiple Antipsychotic Therapies: NA   Allergies as of 12/18/2017   No Known Allergies     Medication List    STOP taking these medications   nicotine 14 mg/24hr patch Commonly known as:  NICODERM CQ - dosed in mg/24 hours Replaced by:  nicotine 21 mg/24hr patch   omeprazole 40 MG capsule Commonly known as:  PRILOSEC   oxyCODONE 5 MG immediate release tablet Commonly known as:  Oxy IR/ROXICODONE     TAKE these medications     Indication  atorvastatin 80 MG tablet Commonly known as:  LIPITOR Take 1 tablet (80 mg total) by mouth daily at 6 PM.  Indication:  High Amount of Fats in the Blood   hydrOXYzine 25 MG tablet Commonly known as:  ATARAX/VISTARIL Take 1 tablet (25 mg total) by mouth every 6 (six) hours as needed for anxiety.  Indication:  Feeling Anxious   meloxicam 7.5 MG tablet Commonly known as:  MOBIC Take 7.5 mg by mouth daily.  Indication:  Joint Damage causing Pain and Loss of Function   metoprolol tartrate 25 MG tablet Commonly known as:  LOPRESSOR Take 1 tablet (25 mg total) by mouth 2 (two) times daily.  Indication:  High Blood Pressure Disorder   nicotine 21 mg/24hr patch Commonly known as:  NICODERM CQ - dosed in mg/24 hours Place 1 patch (21 mg total) onto the skin daily. Start taking on:  12/19/2017 Replaces:  nicotine 14 mg/24hr patch  Indication:  Nicotine Addiction   pantoprazole 40 MG tablet Commonly known as:   PROTONIX Take 1 tablet (40 mg total) by mouth daily.  Indication:  Gastroesophageal Reflux Disease   QUEtiapine 300 MG tablet Commonly known as:  SEROQUEL Take 1 tablet (300 mg total) by mouth at bedtime. For mood control What changed:  additional instructions  Indication:  mood stability   traZODone 100 MG tablet Commonly known as:  DESYREL Take 1 tablet (100 mg total) by mouth at bedtime as needed for sleep. What changed:    when to take this  reasons to take this  Indication:  Trouble Sleeping      Follow-up Information    [facility] Follow up.   Specialty:  Behavioral Health Why:  Patient is declining follow-up appt. If you decide that you would like to be seen for outpatient mental health services, please go to [facility] during walk in hours. Monday-Friday 8am-10am. Please bring photo ID, insurance card, and discharge paperwork.  [address] [place] [address] [phone]           Follow-up recommendations:  Continue activity as tolerated. Continue diet as recommended by your PCP. Ensure to keep all appointments with outpatient providers.  Comments:  Patient is instructed prior to discharge to: Take all medications as prescribed by his/her mental healthcare provider. Report any adverse effects and or reactions from the medicines to his/her outpatient provider promptly. Patient has been instructed & cautioned: To not engage in alcohol and or illegal drug use while on prescription medicines. In the event of worsening symptoms, patient is instructed to call the crisis hotline, 911 and or go to the nearest ED for appropriate evaluation and treatment of symptoms. To follow-up  with his/her primary care provider for your other medical issues, concerns and or health care needs.    Signed: Gerlene Burdock Alisson Rozell, FNP 12/18/2017, 9:51 AM

## 2017-12-18 NOTE — Progress Notes (Signed)
Recreation Therapy Notes  Date: 10.11.19 Time: 0930 Location: 300 Hall Dayroom  Group Topic: Stress Management  Goal Area(s) Addresses:  Patient will verbalize importance of using healthy stress management.  Patient will identify positive emotions associated with healthy stress management.   Intervention: Stress Management  Activity :  Meditation.  LRT introduced patients to the stress management technique of meditation.  LRT played a meditation for patients to engage and follow along.  Education:  Stress Management, Discharge Planning.   Education Outcome: Acknowledges edcuation/In group clarification offered/Needs additional education  Clinical Observations/Feedback: Pt did not attend group.    Caroll Rancher, LRT/CTRS         Lillia Abed, Kyros Salzwedel A 12/18/2017 11:03 AM

## 2017-12-18 NOTE — Progress Notes (Signed)
  [facility] Adult Case Management Discharge Plan :  Will you be returning to the same living situation after discharge:  No.Pt will be going to live with his brother at discharge.  At discharge, do you have transportation home?: Yes,  brother. pt provided with bus pass as well.  Do you have the ability to pay for your medications: Yes,  managed medicare  Release of information consent forms completed and submitted to medical records by CSW.   Patient to Follow up at: Follow-up Information    [facility] Follow up.   Specialty:  Behavioral Health Why:  Patient is declining follow-up appt. If you decide that you would like to be seen for outpatient mental health services, please go to [facility] during walk in hours. Monday-Friday 8am-10am. Please bring photo ID, insurance card, and discharge paperwork.  Contact information: [address] [place] [address] [phone]           Next level of care provider has access to [facility] Link:no  Safety Planning and Suicide Prevention discussed: Yes,  SPE completed with pt; pt declined to consent to collateral contact. SPI pamphlet and mobile crisis information provided.    Has patient been referred to the Quitline?: Patient refused referral  Patient has been referred for addiction treatment: Yes  Rona Ravens, LCSW 12/18/2017, 9:50 AM

## 2017-12-18 NOTE — BHH Suicide Risk Assessment (Signed)
[facility] Discharge Suicide Risk Assessment   Principal Problem: Severe recurrent major depression without psychotic features [facility]) Discharge Diagnoses:  Patient Active Problem List   Diagnosis Date Noted  . Severe recurrent major depression without psychotic features (HCC) [F33.2] 12/15/2017  . S/P CABG x 3 [Z95.1] 06/29/2015  . Coronary artery disease [I25.10] 06/26/2015  . Non-ST elevation MI (NSTEMI) (HCC) [I21.4] 06/26/2015  . Hyperlipidemia [E78.5] 06/26/2015  . Diabetes mellitus (HCC) [E11.9] 06/26/2015  . Alcohol dependence (HCC) [F10.20] 10/16/2012  . Cocaine abuse (HCC) [F14.10] 10/16/2012  . Psychosis (HCC) [F29] 10/16/2012  . Unspecified episodic mood disorder [F39] 10/16/2012    Total Time spent with patient: 15 minutes  Musculoskeletal: Strength & Muscle Tone: within normal limits Gait & Station: normal Patient leans: N/A  Psychiatric Specialty Exam: Review of Systems  All other systems reviewed and are negative.   Blood pressure 100/64, pulse 90, temperature 98.3 F (36.8 C), temperature source Oral, resp. rate 16, height 5' 8.9" (1.75 m), weight 73.5 kg, SpO2 100 %.Body mass index is 23.99 kg/m.  General Appearance: Casual  Eye Contact::  Fair  Speech:  Normal Rate409  Volume:  Normal  Mood:  Euthymic  Affect:  Congruent  Thought Process:  Coherent and Descriptions of Associations: Intact  Orientation:  Full (Time, Place, and Person)  Thought Content:  Logical  Suicidal Thoughts:  No  Homicidal Thoughts:  No  Memory:  Immediate;   Fair Recent;   Fair Remote;   Fair  Judgement:  Intact  Insight:  Lacking  Psychomotor Activity:  Increased  Concentration:  Fair  Recall:  [employer] of Knowledge:Fair  Language: Fair  Akathisia:  Negative  Handed:  Right  AIMS (if indicated):     Assets:  Desire for Improvement Physical Health Resilience Social Support  Sleep:  Number of Hours: 6.5  Cognition: WNL  ADL's:  Intact   Mental Status Per Nursing  Assessment::   On Admission:  Self-harm thoughts  Demographic Factors:  Male, Age 65 or older, Divorced or widowed, Low socioeconomic status and Unemployed  Loss Factors: Financial problems/change in socioeconomic status  Historical Factors: Impulsivity  Risk Reduction Factors:   Sense of responsibility to family  Continued Clinical Symptoms:  Alcohol/Substance Abuse/Dependencies Schizophrenia:   Paranoid or undifferentiated type  Cognitive Features That Contribute To Risk:  None    Suicide Risk:  Minimal: No identifiable suicidal ideation.  Patients presenting with no risk factors but with morbid ruminations; may be classified as minimal risk based on the severity of the depressive symptoms  Follow-up Information    [facility] Follow up.   Specialty:  Behavioral Health Why:  Patient is declining follow-up appt. If you decide that you would like to be seen for outpatient mental health services, please go to [facility] during walk in hours. Monday-Friday 8am-10am. Please bring photo ID, insurance card, and discharge paperwork.  Contact information: [address] [place] [address] [phone]           Plan Of Care/Follow-up recommendations:  Activity:  ad lib  Antonieta Pert, MD 12/18/2017, 9:50 AM

## 2017-12-24 DIAGNOSIS — Z79899 Other long term (current) drug therapy: Secondary | ICD-10-CM | POA: Diagnosis not present

## 2017-12-24 DIAGNOSIS — Z1211 Encounter for screening for malignant neoplasm of colon: Secondary | ICD-10-CM | POA: Diagnosis not present

## 2017-12-24 DIAGNOSIS — Z23 Encounter for immunization: Secondary | ICD-10-CM | POA: Diagnosis not present

## 2017-12-25 DIAGNOSIS — I2510 Atherosclerotic heart disease of native coronary artery without angina pectoris: Secondary | ICD-10-CM | POA: Diagnosis not present

## 2017-12-25 DIAGNOSIS — K219 Gastro-esophageal reflux disease without esophagitis: Secondary | ICD-10-CM | POA: Diagnosis not present

## 2017-12-25 DIAGNOSIS — E119 Type 2 diabetes mellitus without complications: Secondary | ICD-10-CM | POA: Diagnosis not present

## 2017-12-25 DIAGNOSIS — G8929 Other chronic pain: Secondary | ICD-10-CM | POA: Diagnosis not present

## 2017-12-25 DIAGNOSIS — Z720 Tobacco use: Secondary | ICD-10-CM | POA: Diagnosis not present

## 2018-02-09 ENCOUNTER — Emergency Department (HOSPITAL_COMMUNITY): Admission: EM | Admit: 2018-02-09 | Discharge: 2018-02-09 | Discharge status: Absent without leave | Payer: Self-pay

## 2018-02-09 ENCOUNTER — Other Ambulatory Visit: Payer: Self-pay

## 2018-02-09 ENCOUNTER — Emergency Department (HOSPITAL_COMMUNITY): Payer: Medicare Other

## 2018-02-09 ENCOUNTER — Encounter (HOSPITAL_COMMUNITY): Payer: Self-pay

## 2018-02-09 ENCOUNTER — Emergency Department (HOSPITAL_COMMUNITY)
Admission: EM | Admit: 2018-02-09 | Discharge: 2018-02-10 | Disposition: A | Discharge status: Home / Self Care | Payer: Medicare Other | Attending: Emergency Medicine | Admitting: Emergency Medicine

## 2018-02-09 DIAGNOSIS — J449 Chronic obstructive pulmonary disease, unspecified: Secondary | ICD-10-CM | POA: Insufficient documentation

## 2018-02-09 DIAGNOSIS — I10 Essential (primary) hypertension: Secondary | ICD-10-CM | POA: Insufficient documentation

## 2018-02-09 DIAGNOSIS — Z951 Presence of aortocoronary bypass graft: Secondary | ICD-10-CM | POA: Diagnosis not present

## 2018-02-09 DIAGNOSIS — R079 Chest pain, unspecified: Secondary | ICD-10-CM | POA: Diagnosis not present

## 2018-02-09 DIAGNOSIS — Z79899 Other long term (current) drug therapy: Secondary | ICD-10-CM | POA: Diagnosis not present

## 2018-02-09 DIAGNOSIS — R45851 Suicidal ideations: Secondary | ICD-10-CM | POA: Insufficient documentation

## 2018-02-09 DIAGNOSIS — Z008 Encounter for other general examination: Secondary | ICD-10-CM | POA: Diagnosis present

## 2018-02-09 DIAGNOSIS — I2510 Atherosclerotic heart disease of native coronary artery without angina pectoris: Secondary | ICD-10-CM | POA: Diagnosis not present

## 2018-02-09 DIAGNOSIS — F10229 Alcohol dependence with intoxication, unspecified: Secondary | ICD-10-CM | POA: Insufficient documentation

## 2018-02-09 DIAGNOSIS — Z915 Personal history of self-harm: Secondary | ICD-10-CM | POA: Diagnosis not present

## 2018-02-09 DIAGNOSIS — F1410 Cocaine abuse, uncomplicated: Secondary | ICD-10-CM | POA: Diagnosis present

## 2018-02-09 DIAGNOSIS — F333 Major depressive disorder, recurrent, severe with psychotic symptoms: Secondary | ICD-10-CM | POA: Insufficient documentation

## 2018-02-09 DIAGNOSIS — E119 Type 2 diabetes mellitus without complications: Secondary | ICD-10-CM | POA: Diagnosis not present

## 2018-02-09 DIAGNOSIS — F17210 Nicotine dependence, cigarettes, uncomplicated: Secondary | ICD-10-CM | POA: Diagnosis not present

## 2018-02-09 DIAGNOSIS — F1020 Alcohol dependence, uncomplicated: Secondary | ICD-10-CM | POA: Diagnosis not present

## 2018-02-09 DIAGNOSIS — E876 Hypokalemia: Secondary | ICD-10-CM | POA: Insufficient documentation

## 2018-02-09 DIAGNOSIS — F10920 Alcohol use, unspecified with intoxication, uncomplicated: Secondary | ICD-10-CM

## 2018-02-09 DIAGNOSIS — F332 Major depressive disorder, recurrent severe without psychotic features: Secondary | ICD-10-CM | POA: Diagnosis present

## 2018-02-09 LAB — COMPREHENSIVE METABOLIC PANEL
ALBUMIN: 4.2 g/dL (ref 3.5–5.0)
ALT: 25 U/L (ref 0–44)
ANION GAP: 11 (ref 5–15)
AST: 27 U/L (ref 15–41)
Alkaline Phosphatase: 105 U/L (ref 38–126)
BUN: 10 mg/dL (ref 8–23)
CALCIUM: 9 mg/dL (ref 8.9–10.3)
CO2: 23 mmol/L (ref 22–32)
Chloride: 103 mmol/L (ref 98–111)
Creatinine, Ser: 0.67 mg/dL (ref 0.61–1.24)
GFR calc Af Amer: >60 mL/min (ref >60)
GFR calc non Af Amer: >60 mL/min (ref >60)
GLUCOSE: 102 mg/dL — AB (ref 70–99)
Potassium: 3.3 mmol/L — ABNORMAL LOW (ref 3.5–5.1)
Sodium: 137 mmol/L (ref 135–145)
Total Bilirubin: 1 mg/dL (ref 0.3–1.2)
Total Protein: 7.6 g/dL (ref 6.5–8.1)

## 2018-02-09 LAB — CBC WITH DIFFERENTIAL/PLATELET
ABS IMMATURE GRANULOCYTES: 0.03 10*3/uL (ref 0.00–0.07)
BASOS PCT: 0 %
Basophils Absolute: 0 10*3/uL (ref 0.0–0.1)
EOS ABS: 0.1 10*3/uL (ref 0.0–0.5)
Eosinophils Relative: 1 %
HEMATOCRIT: 45.9 % (ref 39.0–52.0)
Hemoglobin: 15 g/dL (ref 13.0–17.0)
IMMATURE GRANULOCYTES: 0 %
LYMPHS ABS: 2.9 10*3/uL (ref 0.7–4.0)
LYMPHS PCT: 26 %
MCH: 27.8 pg (ref 26.0–34.0)
MCHC: 32.7 g/dL (ref 30.0–36.0)
MCV: 85.2 fL (ref 80.0–100.0)
Monocytes Absolute: 1 10*3/uL (ref 0.1–1.0)
Monocytes Relative: 9 %
NEUTROS PCT: 64 %
NRBC: 0 % (ref 0.0–0.2)
Neutro Abs: 7 10*3/uL (ref 1.7–7.7)
PLATELETS: 283 10*3/uL (ref 150–400)
RBC: 5.39 MIL/uL (ref 4.22–5.81)
RDW: 14.1 % (ref 11.5–15.5)
WBC: 11.1 10*3/uL — AB (ref 4.0–10.5)

## 2018-02-09 LAB — RAPID URINE DRUG SCREEN, HOSP PERFORMED
AMPHETAMINES: NOT DETECTED
Barbiturates: NOT DETECTED
Benzodiazepines: NOT DETECTED
Cocaine: NOT DETECTED
Opiates: NOT DETECTED
TETRAHYDROCANNABINOL: NOT DETECTED

## 2018-02-09 LAB — SALICYLATE LEVEL: Salicylate Lvl: <7 mg/dL (ref 2.8–30.0)

## 2018-02-09 LAB — ETHANOL: ALCOHOL ETHYL (B): 41 mg/dL — AB (ref <10)

## 2018-02-09 LAB — ACETAMINOPHEN LEVEL: Acetaminophen (Tylenol), Serum: <10 ug/mL — ABNORMAL LOW (ref 10–30)

## 2018-02-09 MED ORDER — QUETIAPINE FUMARATE 300 MG PO TABS
300.0000 mg | ORAL_TABLET | Freq: Every day | ORAL | Status: DC
  Administered 2018-02-09: 300 mg via ORAL
  Filled 2018-02-09: qty 1

## 2018-02-09 MED ORDER — NICOTINE 21 MG/24HR TD PT24
21.0000 mg | MEDICATED_PATCH | Freq: Every day | TRANSDERMAL | Status: DC
  Administered 2018-02-09 – 2018-02-10 (×2): 21 mg via TRANSDERMAL
  Filled 2018-02-09 (×2): qty 1

## 2018-02-09 MED ORDER — PANTOPRAZOLE SODIUM 40 MG PO TBEC
40.0000 mg | DELAYED_RELEASE_TABLET | Freq: Every day | ORAL | Status: DC
  Administered 2018-02-09 – 2018-02-10 (×2): 40 mg via ORAL
  Filled 2018-02-09 (×2): qty 1

## 2018-02-09 MED ORDER — MELOXICAM 7.5 MG PO TABS
7.5000 mg | ORAL_TABLET | Freq: Every day | ORAL | Status: DC
  Administered 2018-02-09 – 2018-02-10 (×2): 7.5 mg via ORAL
  Filled 2018-02-09 (×2): qty 1

## 2018-02-09 MED ORDER — POTASSIUM CHLORIDE CRYS ER 20 MEQ PO TBCR
40.0000 meq | EXTENDED_RELEASE_TABLET | Freq: Once | ORAL | Status: AC
  Administered 2018-02-09: 40 meq via ORAL
  Filled 2018-02-09: qty 2

## 2018-02-09 MED ORDER — TRAZODONE HCL 100 MG PO TABS: 100.0000 mg | ORAL_TABLET | Freq: Every evening | ORAL | Status: DC | PRN

## 2018-02-09 MED ORDER — METOPROLOL TARTRATE 25 MG PO TABS
25.0000 mg | ORAL_TABLET | Freq: Two times a day (BID) | ORAL | Status: DC
  Administered 2018-02-10: 25 mg via ORAL
  Filled 2018-02-09 (×2): qty 1

## 2018-02-09 MED ORDER — ATORVASTATIN CALCIUM 80 MG PO TABS
80.0000 mg | ORAL_TABLET | Freq: Every day | ORAL | Status: DC
  Administered 2018-02-09: 80 mg via ORAL
  Filled 2018-02-09 (×2): qty 1

## 2018-02-09 MED ORDER — HYDROXYZINE HCL 25 MG PO TABS: 25.0000 mg | ORAL_TABLET | Freq: Four times a day (QID) | ORAL | Status: DC | PRN

## 2018-02-09 NOTE — ED Provider Notes (Signed)
[facility]-EMERGENCY DEPT Provider Note   CSN: [id number] Arrival date & time: 02/09/18  1729    History   Chief Complaint Chief Complaint  Patient presents with  . Suicidal  . Psychiatric Evaluation    HPI Andre Rojas is a 67 y.o. male who presents with SI and hallucinations. PMH significant for CAD, COPD, chronic back pain, DM, PVD, depression. He states that he's been off of his medicine for the past week because they were stolen. He lives in a house by himself. He is here today because he was going to kill himself by running out in the street. He has had several other attempts to kill himself by jumping out of a window, in a pool to drown, walking in to traffic. He admits to daily alcohol use and cocaine use. He drank 1 40oz today. He states that he sees people in trees with "6" on their head and they told him to run in traffic so he was going to do that but a friend stopped him and had him come to the ED. He reports some chest pain, SOB, and coughing. No fevers, wheezing, abdominal pain, N/V.  HPI  Past Medical History:  Diagnosis Date  . Acid reflux   . Anemia   . Arthritis    hip, right  . CAD (coronary artery disease)   . Chest pain   . Chronic back pain   . COPD (chronic obstructive pulmonary disease) (HCC)   . Diabetes (HCC)   . Erectile dysfunction   . Hyperlipidemia   . Hypertension   . Peripheral vascular disease (HCC)    right leg stays cold  . Pneumonia     Patient Active Problem List   Diagnosis Date Noted  . Severe recurrent major depression without psychotic features (HCC) 12/15/2017  . S/P CABG x 3 06/29/2015  . Coronary artery disease 06/26/2015  . Non-ST elevation MI (NSTEMI) (HCC) 06/26/2015  . Hyperlipidemia 06/26/2015  . Diabetes mellitus (HCC) 06/26/2015  . Alcohol dependence (HCC) 10/16/2012  . Cocaine abuse (HCC) 10/16/2012  . Psychosis (HCC) 10/16/2012  . Unspecified episodic mood disorder 10/16/2012    Past  Surgical History:  Procedure Laterality Date  . CARDIAC CATHETERIZATION  05/2015  . COLONOSCOPY    . CORONARY ARTERY BYPASS GRAFT N/A 06/29/2015   Procedure: CORONARY ARTERY BYPASS GRAFTING (CABG) TIMES THREE USING LEFT INTERNAL MAMMARY ARTERY AND RIGHT GREATER SAPHENOUS VEIN HARVESTED BY ENDOVEIN;  Surgeon: Loreli Slot, MD;  Location: [facility] OR;  Service: Open Heart Surgery;  Laterality: N/A;  . KNEE SURGERY Left   . TEE WITHOUT CARDIOVERSION N/A 06/29/2015   Procedure: TRANSESOPHAGEAL ECHOCARDIOGRAM (TEE);  Surgeon: Loreli Slot, MD;  Location: [facility] OR;  Service: Open Heart Surgery;  Laterality: N/A;        Home Medications    Prior to Admission medications   Medication Sig Start Date End Date Taking? Authorizing Provider  atorvastatin (LIPITOR) 80 MG tablet Take 1 tablet (80 mg total) by mouth daily at 6 PM. Patient not taking: Reported on 12/15/2017 07/03/15   Ardelle Balls, PA-C  hydrOXYzine (ATARAX/VISTARIL) 25 MG tablet Take 1 tablet (25 mg total) by mouth every 6 (six) hours as needed for anxiety. 12/18/17   Money, Gerlene Burdock, FNP  meloxicam (MOBIC) 7.5 MG tablet Take 7.5 mg by mouth daily.    [provider]  metoprolol tartrate (LOPRESSOR) 25 MG tablet Take 1 tablet (25 mg total) by mouth 2 (two) times daily. Patient not  taking: Reported on 12/15/2017 07/03/15   Ardelle BallsZimmerman, Donielle M, PA-C  nicotine (NICODERM CQ - DOSED IN MG/24 HOURS) 21 mg/24hr patch Place 1 patch (21 mg total) onto the skin daily. 12/19/17   Money, Gerlene Burdockravis B, FNP  pantoprazole (PROTONIX) 40 MG tablet Take 1 tablet (40 mg total) by mouth daily. Patient not taking: Reported on 12/15/2017 07/03/15   Ardelle BallsZimmerman, Donielle M, PA-C  QUEtiapine (SEROQUEL) 300 MG tablet Take 1 tablet (300 mg total) by mouth at bedtime. For mood control 12/18/17   Money, Gerlene Burdockravis B, FNP  traZODone (DESYREL) 100 MG tablet Take 1 tablet (100 mg total) by mouth at bedtime as needed for sleep. 12/18/17   Money, Gerlene Burdockravis B, FNP      Family History Family History  Problem Relation Age of Onset  . Cancer Mother        stomach  . Heart disease Father     Social History Social History   Tobacco Use  . Smoking status: Current Every Day Smoker    Packs/day: 0.25    Years: 40.00    Pack years: 10.00    Types: Cigarettes  . Smokeless tobacco: Never Used  Substance Use Topics  . Alcohol use: Yes    Comment: 1/5 per day - Pt states he may only have one or 2 drinks if he drinks any (noted 06/27/15)  . Drug use: Yes    Types: "Crack" cocaine, Cocaine    Comment: yesterday      Allergies   Patient has no known allergies.   Review of Systems Review of Systems  Constitutional: Negative for fever.  Respiratory: Positive for cough and shortness of breath. Negative for wheezing.   Cardiovascular: Positive for chest pain. Negative for leg swelling.  Gastrointestinal: Negative for abdominal pain, nausea and vomiting.  Psychiatric/Behavioral: Positive for hallucinations and suicidal ideas. Negative for self-injury.  All other systems reviewed and are negative.    Physical Exam Updated Vital Signs BP 122/79 (BP Location: Left Arm)   Pulse 78   Temp 97.8 F (36.6 C) (Oral)   Resp 17   Wt 79.4 kg   SpO2 96%   BMI 25.92 kg/m   Physical Exam  Constitutional: He is oriented to person, place, and time. He appears well-developed and well-nourished. No distress.  Calm and cooperative. Intoxicated  HENT:  Head: Normocephalic and atraumatic.  Eyes: Pupils are equal, round, and reactive to light. Conjunctivae are normal. Right eye exhibits no discharge. Left eye exhibits no discharge. No scleral icterus.  Neck: Normal range of motion.  Cardiovascular: Normal rate and regular rhythm.  Pulmonary/Chest: Effort normal and breath sounds normal. No respiratory distress.  Abdominal: Soft. Bowel sounds are normal. He exhibits no distension. There is no tenderness.  Musculoskeletal:  No peripheral edema   Neurological: He is alert and oriented to person, place, and time.  Skin: Skin is warm and dry.  Psychiatric: He has a normal mood and affect. His behavior is normal.  Nursing note and vitals reviewed.    ED Treatments / Results  Labs (all labs ordered are listed, but only abnormal results are displayed) Labs Reviewed  COMPREHENSIVE METABOLIC PANEL - Abnormal; Notable for the following components:      Result Value   Potassium 3.3 (*)    Glucose, Bld 102 (*)    All other components within normal limits  ETHANOL - Abnormal; Notable for the following components:   Alcohol, Ethyl (B) 41 (*)    All other components within normal limits  CBC WITH DIFFERENTIAL/PLATELET - Abnormal; Notable for the following components:   WBC 11.1 (*)    All other components within normal limits  ACETAMINOPHEN LEVEL - Abnormal; Notable for the following components:   Acetaminophen (Tylenol), Serum <10 (*)    All other components within normal limits  RAPID URINE DRUG SCREEN, HOSP PERFORMED  SALICYLATE LEVEL    EKG None  Radiology Dg Chest 2 View  Result Date: 02/09/2018 CLINICAL DATA:  Suicidal EXAM: CHEST - 2 VIEW COMPARISON:  07/17/2015 FINDINGS: Prior CABG. Heart is normal size. No confluent airspace opacities or effusions. No acute bony abnormality. IMPRESSION: No active cardiopulmonary disease. Electronically Signed   By: Charlett Nose M.D.   On: 02/09/2018 19:11    Procedures Procedures (including critical care time)  Medications Ordered in ED Medications - No data to display   Initial Impression / Assessment and Plan / ED Course  I have reviewed the triage vital signs and the nursing notes.  Pertinent labs & imaging results that were available during my care of the patient were reviewed by me and considered in my medical decision making (see chart for details).  67 year old male presents with SI and hallucinations. Vitals are normal. Labs are overall normal. ETOH is 41. UDS is normal  although he endorses cocaine use. EKG is SR with non-specific T wave abnormalities. CXR is negative. PT medically cleared for TTS.  Final Clinical Impressions(s) / ED Diagnoses   Final diagnoses:  Suicidal ideation  Hypokalemia  Alcoholic intoxication without complication [facility])    ED Discharge Orders    None       Bethel Born, PA-C 02/09/18 1920    Charlynne Pander, MD 02/09/18 2330

## 2018-02-09 NOTE — ED Triage Notes (Signed)
Pt arrives via [employer]. Pt reports ETOH use. Pt reports he has auditory and visual hallucinations. Pt reports command hallucinations telling him to harm himself. Pt reports that he has jumped out of a window in the past in an attempt to harm himself.

## 2018-02-09 NOTE — ED Notes (Signed)
ED Provider at bedside. 

## 2018-02-09 NOTE — BH Assessment (Signed)
Assessment Note  Andre Rojas is a 67 y.o. divorced male who [facility] due to suicidal thoughts with a plan and hallucinations.  Pt states he has a "plan to run into traffic because of the voices."  Pt reports "I been hearing voices and seeing shadows since Friday."   (Throughout the assessment the patient was periodically looking above his head as if he was looking for someone other than the Clinical research associatewriter).  Pt was not able to contract for safety.  Pt denies HI.  Pt admits Substance Use: Cocaine (1 gram weekly); Marijuana (2 times a month); Alcohol (sometimes).   Per pt's chart pt has a history of suicide attempts. Pt stated "2 yrs ago I walked into traffic and the car stopped."  Pt was previousely [place] [facility] [place] but pt denies currently receiving treatment.  Pt has been inpatient inpatient 2014 and 12/[facility].   Pt reports having 3 children, 13 grandchildren and 5 great grandchildren. According to pt's chart their is strained relationship between pt and his daughter. Pt report receiving support from his sister but did not release his sisters name.   Patient was wearing scrubs and appeared appropriately groomed.  Pt was alert but restless throughout the assessment.  Patient made poor eye contact and had abnormal psychomotor activity.  Patient spoke in a normal voice without pressured speech.  Pt expressed feeling depressed and suicidal.  Pt's affect appeared dysphoric depressed and congruent with stated mood. Pt's thought process was coherent and logical.  Pt presented with poor insight and judgement.  Pt is not able to contract for safety.   Disposition: Case discussed with Andre Rojas provider, Andre ConnJason Berry, FNP who recommends that the patient is observed for safety and stability and re-evaluated in the AM by psychiatry.  LPC informed the ER provider, Andre Rojas and ER nurse of the recommended disposition.   Diagnosis: Major  Depressive Disorder, Recurrent episode with psychotic features;   Past Medical History:  Past Medical History:  Diagnosis Date  . Acid reflux   . Anemia   . Arthritis    hip, right  . CAD (coronary artery disease)   . Chest pain   . Chronic back pain   . COPD (chronic obstructive pulmonary disease) (HCC)   . Diabetes (HCC)   . Erectile dysfunction   . Hyperlipidemia   . Hypertension   . Peripheral vascular disease (HCC)    right leg stays cold  . Pneumonia     Past Surgical History:  Procedure Laterality Date  . CARDIAC CATHETERIZATION  05/2015  . COLONOSCOPY    . CORONARY ARTERY BYPASS GRAFT N/A 06/29/2015   Procedure: CORONARY ARTERY BYPASS GRAFTING (CABG) TIMES THREE USING LEFT INTERNAL MAMMARY ARTERY AND RIGHT GREATER SAPHENOUS VEIN HARVESTED BY ENDOVEIN;  Surgeon: Andre SlotSteven C Hendrickson, MD;  Location: [facility] OR;  Service: Open Heart Surgery;  Laterality: N/A;  . KNEE SURGERY Left   . TEE WITHOUT CARDIOVERSION N/A 06/29/2015   Procedure: TRANSESOPHAGEAL ECHOCARDIOGRAM (TEE);  Surgeon: Andre SlotSteven C Hendrickson, MD;  [facility];  Service: Open Heart Surgery;  Laterality: N/A;    Family History:  Family History  Problem Relation Age of Onset  . Cancer Mother        stomach  . Heart disease Father     Social History:  reports that he has been smoking cigarettes. He has a 10.00 pack-year smoking history. He has never used smokeless  tobacco. He reports that he drinks alcohol. He reports that he has current or past drug history. Drugs: "Crack" cocaine and Cocaine.  Additional Social History:  Alcohol / Drug Use Pain Medications: See MARs Prescriptions: See MARs Over the Counter: See MARs History of alcohol / drug use?: Yes Longest period of sobriety (when/how long): 5 yrs Substance #1 Name of Substance 1: Cocaine 1 - Age of First Use: 67 y/o 1 - Amount (size/oz): 1 gram 1 - Frequency: weekly 1 - Duration: ongoing 1 - Last Use / Amount: "the other day" Substance #2 Name  of Substance 2: Alcohol 2 - Age of First Use: unknown 2 - Amount (size/oz): unknown 2 - Frequency: "not to often" 2 - Duration: ongoing 2 - Last Use / Amount: "the other day" Substance #3 Name of Substance 3: Marijuana 3 - Age of First Use: unknown 3 - Amount (size/oz): unknown 3 - Frequency: "2 times a month" 3 - Duration: ongoing 3 - Last Use / Amount: "the other day"  CIWA: CIWA-Ar BP: 122/79 Pulse Rate: 78 COWS:    Allergies: No Known Allergies  Home Medications:  (Not in a hospital admission)  OB/GYN Status:  No LMP for male patient.  General Assessment Data Location of Assessment: [facility] TTS Assessment: In system Is this a Tele or Face-to-Face Assessment?: Face-to-Face Is this an Initial Assessment or a Re-assessment for this encounter?: Initial Assessment Patient Accompanied by:: N/A Language Other than English: No Living Arrangements: Other (Comment) What gender do you identify as?: Male Marital status: Divorced Jordan name: NA Pregnancy Status: No Living Arrangements: Alone(Pt reports that he lives alone) Can pt return to current living arrangement?: Yes Admission Status: Voluntary Is patient capable of signing voluntary admission?: Yes Referral Source: Self/Family/Friend Insurance type: [place] [employer]     Crisis Care Plan Living Arrangements: Alone(Pt reports that he lives alone) Legal Guardian: Other:(Self) Name of Psychiatrist: Mrs. Andre Rojas(Per chart Andre. Andre Rojas ) Name of Therapist: None  Education Status Is patient currently in school?: No Is the patient employed, unemployed or receiving disability?: (Pt reports being retired)  Risk to self with the past 6 months Suicidal Ideation: Yes-Currently Present Has patient been a risk to self within the past 6 months prior to admission? : Yes Suicidal Intent: Yes-Currently Present Has patient had any suicidal intent within the past 6 months prior to admission? : Yes Is patient at risk for suicide?:  Yes Suicidal Plan?: Yes-Currently Present Has patient had any suicidal plan within the past 6 months prior to admission? : Yes Specify Current Suicidal Plan: Walk into traffic Access to Means: Yes Specify Access to Suicidal Means: Can get to traffic What has been your use of drugs/alcohol within the last 12 months?: Cocaine, Alcohol and marijuana Previous Attempts/Gestures: Yes How many times?: 2 Other Self Harm Risks: none Triggers for Past Attempts: Family contact, Hallucinations, Unpredictable Intentional Self Injurious Behavior: None Family Suicide History: Unknown Recent stressful life event(s): Other (Comment)(Hallucinations) Persecutory voices/beliefs?: No Depression: Yes Depression Symptoms: Tearfulness, Isolating, Guilt, Loss of interest in usual pleasures, Feeling worthless/self pity Substance abuse history and/or treatment for substance abuse?: Yes Suicide prevention information given to non-admitted patients: Not applicable  Risk to Others within the past 6 months Homicidal Ideation: No Does patient have any lifetime risk of violence toward others beyond the six months prior to admission? : No Thoughts of Harm to Others: No Current Homicidal Intent: No Current Homicidal Plan: No Access to Homicidal Means: No Describe Access to Homicidal Means: NA Identified  Victim: NA History of harm to others?: No Assessment of Violence: None Noted Violent Behavior Description: none Does patient have access to weapons?: No Criminal Charges Pending?: No Does patient have a court date: No Is patient on probation?: No  Psychosis Hallucinations: Auditory, Visual, With command Delusions: None noted  Mental Status Report Appearance/Hygiene: In scrubs Eye Contact: Poor Motor Activity: Restlessness Speech: Logical/coherent Level of Consciousness: Alert Mood: Depressed, Suspicious Affect: Anxious, Depressed Anxiety Level: Moderate Thought Processes: Coherent, Relevant Judgement:  Partial Orientation: Person, Place, Situation, Appropriate for developmental age Obsessive Compulsive Thoughts/Behaviors: Minimal  Cognitive Functioning Concentration: Fair Memory: Recent Intact, Remote Intact Is patient IDD: No Insight: Poor Impulse Control: Poor Appetite: Poor Have you had any weight changes? : No Change Sleep: Decreased Total Hours of Sleep: 2 Vegetative Symptoms: Staying in bed  ADLScreening [facility] Services) Patient's cognitive ability adequate to safely complete daily activities?: Yes Patient able to express need for assistance with ADLs?: Yes Independently performs ADLs?: Yes (appropriate for developmental age)  Prior Inpatient Therapy Prior Inpatient Therapy: Yes Prior Therapy Dates: 2014; 2019 Prior Therapy Facilty/Provider(s): [facility] Reason for Treatment: SA and Depression  Prior Outpatient Therapy Prior Outpatient Therapy: Yes(Pt denies currently receiving outpatient treatment) Prior Therapy Dates: 12/2017(Pt denies currently receiving outpatient treatment) Prior Therapy Facilty/Provider(s): [facility] Reason for Treatment: SA and Depression Does patient have an ACCT team?: No Does patient have Intensive In-House Services?  : No Does patient have [facility] services? : No Does patient have P4CC services?: No  ADL Screening (condition at time of admission) Patient's cognitive ability adequate to safely complete daily activities?: Yes Is the patient deaf or have difficulty hearing?: No Does the patient have difficulty seeing, even when wearing glasses/contacts?: No Does the patient have difficulty concentrating, remembering, or making decisions?: No Patient able to express need for assistance with ADLs?: Yes Does the patient have difficulty dressing or bathing?: No Independently performs ADLs?: Yes (appropriate for developmental age) Does the patient have difficulty walking or climbing stairs?: No Weakness of Legs: None Weakness of  Arms/Hands: None  Home Assistive Devices/Equipment Home Assistive Devices/Equipment: None    Abuse/Neglect Assessment (Assessment to be complete while patient is alone) Abuse/Neglect Assessment Can Be Completed: Yes Physical Abuse: Denies Verbal Abuse: Denies Sexual Abuse: Denies Exploitation of patient/patient's resources: Denies Self-Neglect: Denies Values / Beliefs Cultural Requests During Hospitalization: None Spiritual Requests During Hospitalization: None Consults Spiritual Care Consult Needed: No Social Work Consult Needed: No Merchant navy officer (For Healthcare) Does Patient Have a Medical Advance Directive?: No Would patient like information on creating a medical advance directive?: No - Patient declined          Disposition: Case discussed with BH provider, Andre Conn, FNP who recommends that the patient is observed for safety and stability and re-evaluated in the AM by psychiatry.  LPC informed the ER provider, Andre Andre Lank and ER nurse of the recommended disposition.  Disposition Initial Assessment Completed for this Encounter: Yes  On Site Evaluation by:   Reviewed with Physician:    Tyron Russell, MS, LPC, NCC 02/09/2018 8:38 PM

## 2018-02-09 NOTE — ED Notes (Signed)
AC notified need for sitter

## 2018-02-10 ENCOUNTER — Other Ambulatory Visit: Payer: Self-pay

## 2018-02-10 DIAGNOSIS — F333 Major depressive disorder, recurrent, severe with psychotic symptoms: Secondary | ICD-10-CM | POA: Diagnosis not present

## 2018-02-10 DIAGNOSIS — F1020 Alcohol dependence, uncomplicated: Secondary | ICD-10-CM

## 2018-02-10 MED ORDER — DIVALPROEX SODIUM 250 MG PO DR TAB: 250.0000 mg | DELAYED_RELEASE_TABLET | Freq: Two times a day (BID) | ORAL | 0 refills | Status: DC

## 2018-02-10 MED ORDER — QUETIAPINE FUMARATE 300 MG PO TABS: 300.0000 mg | ORAL_TABLET | Freq: Every day | ORAL | 0 refills | Status: DC

## 2018-02-10 NOTE — BH Assessment (Signed)
[facility] Assessment Progress Note  Per Jacqueline Norman, DO, this pt does not require psychiatric hospitalization at this time.  [facility] is to be discharged from [facility] with recommendation to follow up with [facility].  This has been included in pt's discharge instructions.  Pt would also benefit from seeing Peer Support Specialists; they will be asked to speak to pt.  Pt's nurse, Ashley, has been notified.  Andre Baldonado, MA Triage Specialist [phone]     

## 2018-02-10 NOTE — ED Notes (Signed)
PSYCH MD Provider at bedside. 

## 2018-02-10 NOTE — ED Notes (Signed)
PEER SUPPORT HAS SEEN PATIENT. BUS PASS TO BE GIVEN AT DISCHARGE

## 2018-02-10 NOTE — Patient Outreach (Signed)
ED Peer Support Specialist Patient Intake (Complete at intake & 30-60 Day Follow-up)  Name: Andre Rojas  MRN: [id number]  Age: 67 y.o.   Date of Admission: 02/10/2018  Intake: Initial Comments:      Primary Reason Admitted: Pt stated he was hearing voices and wants inpatient services.   Lab values: Alcohol/ETOH: Negative Positive UDS? No Amphetamines: No Barbiturates: No Benzodiazepines: No Cocaine: No Opiates: No Cannabinoids: No  Demographic information: Gender:   Ethnicity: African American Marital Status: Single Insurance Status: Medicaid Ecologist (Work Neurosurgeon, Physicist, medical, etc.: No Lives with: Alone Living situation: House/Apartment  Reported Patient History: Patient reported health conditions: Schizoaffective disorder, Bipolar disorder Patient aware of HIV and hepatitis status: No  In past year, has patient visited ED for any reason? Yes  Number of ED visits: 2  Reason(s) for visit: Various reasons   In past year, has patient been hospitalized for any reason? No  Number of hospitalizations:    Reason(s) for hospitalization:    In past year, has patient been arrested? No  Number of arrests:    Reason(s) for arrest:    In past year, has patient been incarcerated? No  Number of incarcerations:    Reason(s) for incarceration:    In past year, has patient received medication-assisted treatment? No  In past year, patient received the following treatments: Residential treatment (non-hospital)  In past year, has patient received any harm reduction services? No  Did this include any of the following?    In past year, has patient received care from a mental health provider for diagnosis other than SUD? No  In past year, is this first time patient has overdosed? No  Number of past overdoses:    In past year, is this first time patient has been hospitalized for an overdose? No  Number of hospitalizations for  overdose(s):    Is patient currently receiving treatment for a mental health diagnosis? No  Patient reports experiencing difficulty participating in SUD treatment: No    Most important reason(s) for this difficulty?    Has patient received prior services for treatment? No  In past, patient has received services from following agencies:    Plan of Care:  Suggested follow up at these agencies/treatment centers: (Wants to get into inpatient services.)  Other information: [facility] met with Pt and was made aware that Pt does not want any outpatient services. CPSS addressed the fact that there are several services that maybe of help for what Pt addressed was his issue. CPSS asked Pt about family support or any support of any sort. CPSS was told that he wants inpatient services only. CPSS was trying to help Pt understand that he may not meet criteria to be placed inpatient. CPSS gave Pt contact information for Pt to follow up with CPSS in the community.    Aaron Edelman Daphane Odekirk, CPSS  02/10/2018 11:22 AM
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
## 2018-02-10 NOTE — Discharge Instructions (Signed)
For your mental health needs, you are advised to follow up with [facility].  New and returning patients are seen at their walk-in clinic.  Walk-in hours are Monday - Friday from 8:00 am - 3:00 pm.  Walk-in patients are seen on a first come, first served basis.  Try to arrive as early as possible for he best chance of being seen the same day: ° °     [facility] °     [address] °     [place],  [address] °     [phone] °

## 2018-02-19 DIAGNOSIS — Z79899 Other long term (current) drug therapy: Secondary | ICD-10-CM | POA: Diagnosis not present

## 2018-02-19 DIAGNOSIS — Z0001 Encounter for general adult medical examination with abnormal findings: Secondary | ICD-10-CM | POA: Diagnosis not present

## 2018-02-19 DIAGNOSIS — J449 Chronic obstructive pulmonary disease, unspecified: Secondary | ICD-10-CM | POA: Diagnosis not present

## 2018-02-19 DIAGNOSIS — I25708 Atherosclerosis of coronary artery bypass graft(s), unspecified, with other forms of angina pectoris: Secondary | ICD-10-CM | POA: Diagnosis not present

## 2018-03-04 ENCOUNTER — Encounter (HOSPITAL_COMMUNITY): Payer: Self-pay

## 2018-03-04 ENCOUNTER — Other Ambulatory Visit: Payer: Self-pay

## 2018-03-04 ENCOUNTER — Emergency Department (HOSPITAL_COMMUNITY)
Admission: EM | Admit: 2018-03-04 | Discharge: 2018-03-06 | Disposition: A | Discharge status: Other Acute Inpatient Hospital | Payer: Medicare Other | Attending: Emergency Medicine | Admitting: Emergency Medicine

## 2018-03-04 DIAGNOSIS — J449 Chronic obstructive pulmonary disease, unspecified: Secondary | ICD-10-CM | POA: Diagnosis not present

## 2018-03-04 DIAGNOSIS — R0789 Other chest pain: Secondary | ICD-10-CM | POA: Diagnosis not present

## 2018-03-04 DIAGNOSIS — R45850 Homicidal ideations: Secondary | ICD-10-CM | POA: Diagnosis not present

## 2018-03-04 DIAGNOSIS — R1111 Vomiting without nausea: Secondary | ICD-10-CM | POA: Diagnosis not present

## 2018-03-04 DIAGNOSIS — I2510 Atherosclerotic heart disease of native coronary artery without angina pectoris: Secondary | ICD-10-CM | POA: Insufficient documentation

## 2018-03-04 DIAGNOSIS — F209 Schizophrenia, unspecified: Secondary | ICD-10-CM | POA: Insufficient documentation

## 2018-03-04 DIAGNOSIS — R45851 Suicidal ideations: Secondary | ICD-10-CM | POA: Insufficient documentation

## 2018-03-04 DIAGNOSIS — I10 Essential (primary) hypertension: Secondary | ICD-10-CM | POA: Insufficient documentation

## 2018-03-04 DIAGNOSIS — F17210 Nicotine dependence, cigarettes, uncomplicated: Secondary | ICD-10-CM | POA: Diagnosis not present

## 2018-03-04 DIAGNOSIS — R069 Unspecified abnormalities of breathing: Secondary | ICD-10-CM | POA: Diagnosis not present

## 2018-03-04 DIAGNOSIS — F319 Bipolar disorder, unspecified: Secondary | ICD-10-CM | POA: Diagnosis not present

## 2018-03-04 DIAGNOSIS — F10920 Alcohol use, unspecified with intoxication, uncomplicated: Secondary | ICD-10-CM | POA: Diagnosis not present

## 2018-03-04 DIAGNOSIS — R079 Chest pain, unspecified: Secondary | ICD-10-CM | POA: Diagnosis not present

## 2018-03-04 DIAGNOSIS — E876 Hypokalemia: Secondary | ICD-10-CM | POA: Diagnosis not present

## 2018-03-04 DIAGNOSIS — Z79899 Other long term (current) drug therapy: Secondary | ICD-10-CM | POA: Insufficient documentation

## 2018-03-04 DIAGNOSIS — R443 Hallucinations, unspecified: Secondary | ICD-10-CM | POA: Diagnosis present

## 2018-03-04 DIAGNOSIS — Z951 Presence of aortocoronary bypass graft: Secondary | ICD-10-CM | POA: Diagnosis not present

## 2018-03-04 DIAGNOSIS — E119 Type 2 diabetes mellitus without complications: Secondary | ICD-10-CM | POA: Insufficient documentation

## 2018-03-04 LAB — I-STAT TROPONIN, ED: Troponin i, poc: 0.02 ng/mL (ref 0.00–0.08)
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
## 2018-03-05 ENCOUNTER — Emergency Department (HOSPITAL_COMMUNITY): Payer: Medicare Other

## 2018-03-05 DIAGNOSIS — R079 Chest pain, unspecified: Secondary | ICD-10-CM | POA: Diagnosis not present

## 2018-03-05 DIAGNOSIS — F209 Schizophrenia, unspecified: Secondary | ICD-10-CM | POA: Diagnosis not present

## 2018-03-05 LAB — CBC
HCT: 43.4 % (ref 39.0–52.0)
Hemoglobin: 14.4 g/dL (ref 13.0–17.0)
MCH: 27.6 pg (ref 26.0–34.0)
MCHC: 33.2 g/dL (ref 30.0–36.0)
MCV: 83.3 fL (ref 80.0–100.0)
Platelets: 269 10*3/uL (ref 150–400)
RBC: 5.21 MIL/uL (ref 4.22–5.81)
RDW: 14.2 % (ref 11.5–15.5)
WBC: 12 10*3/uL — ABNORMAL HIGH (ref 4.0–10.5)
nRBC: 0 % (ref 0.0–0.2)

## 2018-03-05 LAB — COMPREHENSIVE METABOLIC PANEL
ALT: 21 U/L (ref 0–44)
AST: 26 U/L (ref 15–41)
Albumin: 3.7 g/dL (ref 3.5–5.0)
Alkaline Phosphatase: 97 U/L (ref 38–126)
Anion gap: 14 (ref 5–15)
BILIRUBIN TOTAL: 0.9 mg/dL (ref 0.3–1.2)
BUN: <5 mg/dL — ABNORMAL LOW (ref 8–23)
CO2: 24 mmol/L (ref 22–32)
Calcium: 9 mg/dL (ref 8.9–10.3)
Chloride: 105 mmol/L (ref 98–111)
Creatinine, Ser: 0.66 mg/dL (ref 0.61–1.24)
GFR calc Af Amer: >60 mL/min (ref >60)
GFR calc non Af Amer: >60 mL/min (ref >60)
Glucose, Bld: 122 mg/dL — ABNORMAL HIGH (ref 70–99)
Potassium: 2.9 mmol/L — ABNORMAL LOW (ref 3.5–5.1)
Sodium: 143 mmol/L (ref 135–145)
Total Protein: 7 g/dL (ref 6.5–8.1)

## 2018-03-05 LAB — VALPROIC ACID LEVEL

## 2018-03-05 LAB — RAPID URINE DRUG SCREEN, HOSP PERFORMED
Amphetamines: NOT DETECTED
Barbiturates: NOT DETECTED
Benzodiazepines: NOT DETECTED
Cocaine: NOT DETECTED
Opiates: NOT DETECTED
Tetrahydrocannabinol: NOT DETECTED

## 2018-03-05 LAB — I-STAT TROPONIN, ED: Troponin i, poc: 0.03 ng/mL (ref 0.00–0.08)

## 2018-03-05 LAB — ACETAMINOPHEN LEVEL: Acetaminophen (Tylenol), Serum: <10 ug/mL — ABNORMAL LOW (ref 10–30)

## 2018-03-05 LAB — SALICYLATE LEVEL: Salicylate Lvl: <7 mg/dL (ref 2.8–30.0)

## 2018-03-05 LAB — ETHANOL: Alcohol, Ethyl (B): 166 mg/dL — ABNORMAL HIGH (ref <10)

## 2018-03-05 MED ORDER — ALUM & MAG HYDROXIDE-SIMETH 200-200-20 MG/5ML PO SUSP: 30.0000 mL | Freq: Four times a day (QID) | ORAL | Status: DC | PRN

## 2018-03-05 MED ORDER — POTASSIUM CHLORIDE CRYS ER 20 MEQ PO TBCR
40.0000 meq | EXTENDED_RELEASE_TABLET | Freq: Once | ORAL | Status: AC
  Administered 2018-03-05: 40 meq via ORAL
  Filled 2018-03-05: qty 2

## 2018-03-05 MED ORDER — METOPROLOL TARTRATE 25 MG PO TABS
25.0000 mg | ORAL_TABLET | Freq: Two times a day (BID) | ORAL | Status: DC
  Administered 2018-03-05 – 2018-03-06 (×4): 25 mg via ORAL
  Filled 2018-03-05 (×4): qty 1

## 2018-03-05 MED ORDER — TRAZODONE HCL 100 MG PO TABS: 100.0000 mg | ORAL_TABLET | Freq: Every evening | ORAL | Status: DC | PRN

## 2018-03-05 MED ORDER — PANTOPRAZOLE SODIUM 40 MG PO TBEC
40.0000 mg | DELAYED_RELEASE_TABLET | Freq: Every day | ORAL | Status: DC
  Administered 2018-03-05 – 2018-03-06 (×2): 40 mg via ORAL
  Filled 2018-03-05 (×2): qty 1

## 2018-03-05 MED ORDER — ATORVASTATIN CALCIUM 80 MG PO TABS
80.0000 mg | ORAL_TABLET | Freq: Every day | ORAL | Status: DC
  Administered 2018-03-05: 80 mg via ORAL
  Filled 2018-03-05: qty 1

## 2018-03-05 MED ORDER — ONDANSETRON HCL 4 MG PO TABS: 4.0000 mg | ORAL_TABLET | Freq: Three times a day (TID) | ORAL | Status: DC | PRN

## 2018-03-05 MED ORDER — NICOTINE 21 MG/24HR TD PT24
21.0000 mg | MEDICATED_PATCH | Freq: Every day | TRANSDERMAL | Status: DC
  Administered 2018-03-05 – 2018-03-06 (×2): 21 mg via TRANSDERMAL
  Filled 2018-03-05 (×2): qty 1

## 2018-03-05 MED ORDER — DIVALPROEX SODIUM 250 MG PO DR TAB
250.0000 mg | DELAYED_RELEASE_TABLET | Freq: Two times a day (BID) | ORAL | Status: DC
  Administered 2018-03-05 – 2018-03-06 (×4): 250 mg via ORAL
  Filled 2018-03-05 (×4): qty 1

## 2018-03-05 MED ORDER — IBUPROFEN 400 MG PO TABS
600.0000 mg | ORAL_TABLET | Freq: Three times a day (TID) | ORAL | Status: DC | PRN
  Administered 2018-03-05 (×2): 600 mg via ORAL
  Filled 2018-03-05 (×2): qty 1

## 2018-03-05 MED ORDER — QUETIAPINE FUMARATE 50 MG PO TABS
300.0000 mg | ORAL_TABLET | Freq: Every day | ORAL | Status: DC
  Administered 2018-03-05: 300 mg via ORAL
  Filled 2018-03-05: qty 2

## 2018-03-05 MED ORDER — HYDROXYZINE HCL 25 MG PO TABS
25.0000 mg | ORAL_TABLET | Freq: Four times a day (QID) | ORAL | Status: DC | PRN
  Administered 2018-03-05: 25 mg via ORAL
  Filled 2018-03-05: qty 1

## 2018-03-05 MED ORDER — POTASSIUM CHLORIDE CRYS ER 20 MEQ PO TBCR
40.0000 meq | EXTENDED_RELEASE_TABLET | Freq: Two times a day (BID) | ORAL | Status: AC
  Administered 2018-03-05 – 2018-03-06 (×3): 40 meq via ORAL
  Filled 2018-03-05 (×4): qty 2

## 2018-03-05 NOTE — ED Notes (Signed)
Regular Diet was ordered for Lunch. 
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
## 2018-03-05 NOTE — ED Notes (Signed)
Patient was given Henderson CloudGraham Crackers and Diet Sprite.
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
## 2018-03-05 NOTE — ED Notes (Signed)
IVC paper has been served; [facility]; 3 copies on chart and original placed in red folder; copy placed in medical records-Monique,RN

## 2018-03-05 NOTE — Progress Notes (Signed)
Pt meets inpatient criteria per [name], PA. Referral information has been sent to the following hospitals for review: [facility]  [facility]  [facility]  [facility]  [facility]   Disposition will continue to assist with inpatient placement needs.   Wells GuilesSarah Calden Dorsey, LCSW, LCAS Disposition CSW [facility] [phone]

## 2018-03-05 NOTE — Progress Notes (Signed)
[facility]; B-unit Dr. Wendall StadeKohl is the accepting/attending provider.   Call report to [phone] Clydie BraunKaren @ [facility] notified.    CS[facility] spoke with [facility], who agreed to begin the IVC process as pt is endorsing SI, HI and A/V hallucinations.  Once pt is involuntarily committed, he can be transported by Patent examinerlaw enforcement.  [facility] as soon as [facility] is arranged.  Completed IVC paperwork should be faxed to admissions staff at [facility] at: [phone]  Artist PaisSarah Katryna Tschirhart, LCSW, LCAS Disposition CSW [facility] [phone]

## 2018-03-05 NOTE — ED Notes (Signed)
[facility] notified of IVC --need to call back with report after papers are served and definite transportation arranged.

## 2018-03-05 NOTE — ED Notes (Signed)
Andre SievertSimon Spencer, PA, patient meets inpatient criteria. TTS to secure placement. Andre CastillaAndrew, RN, informed of disposition.

## 2018-03-06 DIAGNOSIS — F209 Schizophrenia, unspecified: Secondary | ICD-10-CM | POA: Diagnosis not present

## 2018-03-06 NOTE — ED Notes (Signed)
Breakfast ordered reg 

## 2018-03-06 NOTE — ED Notes (Addendum)
ALL belongings - 3 labeled belongings bags, 1 black back pack, Home Med, and 1 valuables envelope - Deputy - Pt aware.

## 2018-03-06 NOTE — ED Notes (Signed)
Pt aware has been accepted to [facility] and that he will be transported this am. Learta Coddingeputy Perry called and advised will be en route w/in the hour.

## 2018-03-06 NOTE — ED Notes (Addendum)
Pt ambulatory w/[facility]. EMT assisting w/pt's belongings.

## 2018-03-06 NOTE — ED Notes (Signed)
Pt left black eyeglasses in room - Hendricks LimesKeisha, [facility], aware and will advise pt they will be given to Security for Lost and Found.

## 2018-06-10 DIAGNOSIS — I959 Hypotension, unspecified: Secondary | ICD-10-CM | POA: Diagnosis not present

## 2018-06-10 DIAGNOSIS — R945 Abnormal results of liver function studies: Secondary | ICD-10-CM | POA: Diagnosis not present

## 2018-06-10 DIAGNOSIS — S0990XA Unspecified injury of head, initial encounter: Secondary | ICD-10-CM | POA: Diagnosis not present

## 2018-06-10 DIAGNOSIS — R05 Cough: Secondary | ICD-10-CM | POA: Diagnosis not present

## 2018-06-10 DIAGNOSIS — I4581 Long QT syndrome: Secondary | ICD-10-CM | POA: Diagnosis not present

## 2018-06-10 DIAGNOSIS — E785 Hyperlipidemia, unspecified: Secondary | ICD-10-CM | POA: Diagnosis not present

## 2018-06-10 DIAGNOSIS — Z590 Homelessness: Secondary | ICD-10-CM | POA: Diagnosis not present

## 2018-06-10 DIAGNOSIS — E871 Hypo-osmolality and hyponatremia: Secondary | ICD-10-CM | POA: Diagnosis not present

## 2018-06-10 DIAGNOSIS — R079 Chest pain, unspecified: Secondary | ICD-10-CM | POA: Diagnosis not present

## 2018-06-10 DIAGNOSIS — R0902 Hypoxemia: Secondary | ICD-10-CM | POA: Diagnosis not present

## 2018-06-10 DIAGNOSIS — Z951 Presence of aortocoronary bypass graft: Secondary | ICD-10-CM | POA: Diagnosis not present

## 2018-06-10 DIAGNOSIS — D72829 Elevated white blood cell count, unspecified: Secondary | ICD-10-CM | POA: Diagnosis not present

## 2018-06-10 DIAGNOSIS — R0602 Shortness of breath: Secondary | ICD-10-CM | POA: Diagnosis not present

## 2018-06-10 DIAGNOSIS — I2510 Atherosclerotic heart disease of native coronary artery without angina pectoris: Secondary | ICD-10-CM | POA: Diagnosis not present

## 2018-06-10 DIAGNOSIS — I10 Essential (primary) hypertension: Secondary | ICD-10-CM | POA: Diagnosis not present

## 2018-06-10 DIAGNOSIS — M5489 Other dorsalgia: Secondary | ICD-10-CM | POA: Diagnosis not present

## 2018-06-10 DIAGNOSIS — R9431 Abnormal electrocardiogram [ECG] [EKG]: Secondary | ICD-10-CM | POA: Diagnosis not present

## 2018-06-10 DIAGNOSIS — K8020 Calculus of gallbladder without cholecystitis without obstruction: Secondary | ICD-10-CM | POA: Diagnosis not present

## 2018-06-10 DIAGNOSIS — M549 Dorsalgia, unspecified: Secondary | ICD-10-CM | POA: Diagnosis not present

## 2018-06-10 DIAGNOSIS — Z9114 Patient's other noncompliance with medication regimen: Secondary | ICD-10-CM | POA: Diagnosis not present

## 2018-06-16 DIAGNOSIS — F201 Disorganized schizophrenia: Secondary | ICD-10-CM | POA: Insufficient documentation

## 2018-06-16 HISTORY — DX: Disorganized schizophrenia: F20.1

## 2020-02-29 ENCOUNTER — Telehealth: Payer: Self-pay | Admitting: Critical Care Medicine

## 2020-02-29 ENCOUNTER — Encounter: Payer: Self-pay | Admitting: Critical Care Medicine

## 2020-02-29 ENCOUNTER — Other Ambulatory Visit: Payer: Self-pay | Admitting: Critical Care Medicine

## 2020-02-29 NOTE — Progress Notes (Signed)
Patient ID: Andre Rojas, male   DOB: [birth year], 69 y.o.   MRN: [id number] This is a 69 year old male new to the shelter for 1 month first time I have seen the patient in the shelter clinic he comes in complaining of heartburn and reflux symptoms and heavy ethanol use he claims he did has had a couple of drinks of beer for the last few days but largely has quit.  He has previous history of bypass surgery in 2017 he is not on any medications whatsoever he is not on blood pressure medicines denies previous history of hypertension or stroke also should have history of type 2 diabetes but not on therapy  On exam blood pressure 96/60 pulse 70 saturation 96% room air blood sugar was 107 patient has few scattered rales at the bases distant breath sounds card exam was unremarkable  Impression prior history of cocaine use none for a year history of heavy ethanol use severe reflux symptoms history of coronary disease without active treatment for this  Plan is to get this patient into the office I can obtain labs on this patient full physical and then at that point can get him into cardiology I gave him samples of omeprazole 20 mg daily given 21 capsules to take for the reflux we will endeavor to get him in the next few weeks at [facility] wellness clinic

## 2020-02-29 NOTE — Progress Notes (Signed)
Opened in error

## 2020-02-29 NOTE — Telephone Encounter (Signed)
Per Dr. Delford Field scheduled patient an appointment for the first week of Jan. Attempted to call mobile number and reached a message of not in service. Called daughters phone number per comments in appointment desk and call could also not be completed. Scheduled patient for Jan 11th at 3:00.

## 2020-03-14 ENCOUNTER — Other Ambulatory Visit: Payer: Self-pay | Admitting: Critical Care Medicine

## 2020-03-14 ENCOUNTER — Encounter: Payer: Self-pay | Admitting: Critical Care Medicine

## 2020-03-14 MED ORDER — TRAZODONE HCL 100 MG PO TABS: 100.0000 mg | ORAL_TABLET | Freq: Every evening | ORAL | 0 refills | Status: DC | PRN

## 2020-03-14 MED ORDER — ATORVASTATIN CALCIUM 80 MG PO TABS: 80.0000 mg | ORAL_TABLET | Freq: Every day | ORAL | 1 refills | Status: DC

## 2020-03-14 MED ORDER — DIVALPROEX SODIUM 250 MG PO DR TAB: 250.0000 mg | DELAYED_RELEASE_TABLET | Freq: Two times a day (BID) | ORAL | 1 refills | Status: DC

## 2020-03-14 NOTE — Progress Notes (Signed)
Med refills

## 2020-03-14 NOTE — Progress Notes (Signed)
Patient ID: Andre Rojas, male   DOB: [birth year], 70 y.o.   MRN: [id number] 70 year old male seen in the [place] shelter clinic he has history of type 2 diabetes question of hypertension previous coronary disease with bypass surgery he has an existing appointment January 11 in my clinic today he is requesting refills on his sleeping medicine atorvastatin Depakote and aspirin  On exam blood sugar is 114 remaining part of the exam is unremarkable impression is that of coronary disease for this with no refills on the [place] atorvastatin and aspirin  History of bipolar disorder and schizophrenia patient will have refills on Depakote and trazodone all his medications sent [facility] his [facility] and he has been reminded of his upcoming appoint with me on January 11

## 2020-03-19 NOTE — Progress Notes (Signed)
Subjective:    Patient ID: Andre Rojas, male    DOB: 1950/09/24, 70 y.o.   MRN: [id number]  03/20/20: 70 y.o.M [facility] last seen 03/14/20 at shelter.  At 02/29/20 Shelter visit: This is a 70 year old male new to the shelter for 1 month first time I have seen the patient in the shelter clinic he comes in complaining of heartburn and reflux symptoms and heavy ethanol use he claims he did has had a couple of drinks of beer for the last few days but largely has quit.  He has previous history of bypass surgery in 2017 he is not on any medications whatsoever he is not on blood pressure medicines denies previous history of hypertension or stroke also should have history of type 2 diabetes but not on therapy  On exam blood pressure 96/60 pulse 70 saturation 96% room air blood sugar was 107 patient has few scattered rales at the bases distant breath sounds card exam was unremarkable  Impression prior history of cocaine use none for a year history of heavy ethanol use severe reflux symptoms history of coronary disease without active treatment for this  Plan is to get this patient into the office I can obtain labs on this patient full physical and then at that point can get him into cardiology I gave him samples of omeprazole 20 mg daily given 21 capsules to take for the reflux we will endeavor to get him in the next few weeks at [facility]  At 03/14/20 shelter visit: 70 year old male seen in the [facility] clinic he has history of type 2 diabetes question of hypertension previous coronary disease with bypass surgery he has an existing appointment January 11 in my clinic today he is requesting refills on his sleeping medicine atorvastatin Depakote and aspirin  On exam blood sugar is 114 remaining part of the exam is unremarkable impression is that of coronary disease for this with no refills on the [place] atorvastatin and aspirin  History of bipolar disorder and  schizophrenia patient will have refills on Depakote and trazodone all [facility] and he has been reminded of his upcoming appoint with me on January 11  03/20/2020 The patient comes in today with need to establish for primary care previously followed at the [facility]  Patient with history of hypertension, coronary disease, chronic coolness of the right hand, bilateral lower extremity pain right worse than left worse with ambulation, previous bypass surgery, hyperlipidemia, history of schizophrenia.  Also history of COPD documented in the history along with reflux disease Note the patient has had 1 COVID-vaccine [employer] in November.  He is due a pneumonia vaccine Tdap and flu vaccine he accepts these at this visit.  On arrival blood pressure is elevated at 142/83.  On review of the chart he has been on metoprolol and lisinopril in the past but is not on blood pressure medications currently.  He has been taking over-the-counter proton pump inhibitor.  We did give him prescribed atorvastatin 80 mg daily and trazodone at bedtime to help sleep.  He has been on Seroquel in the past.  Formally was on Depakote but is now off the Depakote  He has yet to connect with a mental health provider  He has not seen cardiology in many years and has had bypass surgery.  Patient denies any current chest pain but does have a productive cough.  There is a history of  type 2 diabetes but is not on any medications at this time and on arrival his A1c is greater than 6  Past Medical History:  Diagnosis Date  . Acid reflux   . Anemia   . Arthritis    hip, right  . CAD (coronary artery disease)   . Chest pain   . Chronic back pain   . COPD (chronic obstructive pulmonary disease) (HCC)   . Diabetes (HCC)   . Erectile dysfunction   . Hyperlipidemia   . Hypertension   . Peripheral vascular disease (HCC)    right leg stays cold  . Pneumonia      Family  History  Problem Relation Age of Onset  . Cancer Mother        stomach  . Heart disease Father      Social History   Socioeconomic History  . Marital status: Single    Spouse name: Not on file  . Number of children: Not on file  . Years of education: Not on file  . Highest education level: Not on file  Occupational History  . Not on file  Tobacco Use  . Smoking status: Current Every Day Smoker    Packs/day: 0.25    Years: 40.00    Pack years: 10.00    Types: Cigarettes  . Smokeless tobacco: Never Used  Vaping Use  . Vaping Use: Never used  Substance and Sexual Activity  . Alcohol use: Yes    Comment: 1/5 per day - Pt states he may only have one or 2 drinks if he drinks any (noted 06/27/15)  . Drug use: Yes    Types: "Crack" cocaine, Cocaine    Comment: yesterday   . Sexual activity: Yes    Birth control/protection: Condom  Other Topics Concern  . Not on file  Social History Narrative  . Not on file   Social Determinants of Health   Financial Resource Strain: Not on file  Food Insecurity: Not on file  Transportation Needs: Not on file  Physical Activity: Not on file  Stress: Not on file  Social Connections: Not on file  Intimate Partner Violence: Not on file     No Known Allergies   Outpatient Medications Prior to Visit  Medication Sig Dispense Refill  . atorvastatin (LIPITOR) 80 MG tablet Take 1 tablet (80 mg total) by mouth daily at 6 PM. 90 tablet 1  . traZODone (DESYREL) 100 MG tablet Take 1 tablet (100 mg total) by mouth at bedtime as needed for sleep. 30 tablet 0  . albuterol (PROAIR HFA) 108 (90 Base) MCG/ACT inhaler 2 puff(s)    . aspirin 325 MG tablet 1 tab(s)    . divalproex (DEPAKOTE) 250 MG DR tablet Take 1 tablet (250 mg total) by mouth 2 (two) times daily. (Patient not taking: Reported on 03/20/2020) 60 tablet 1  . hydrOXYzine (ATARAX/VISTARIL) 25 MG tablet Take 1 tablet (25 mg total) by mouth every 6 (six) hours as needed for anxiety. (Patient  not taking: Reported on 03/20/2020) 30 tablet 0  . methocarbamol (ROBAXIN) 500 MG tablet 1 tab(s)    . metoprolol tartrate (LOPRESSOR) 25 MG tablet 1 tab(s)    . omeprazole (PRILOSEC) 40 MG capsule Take 40 mg by mouth daily. (Patient not taking: Reported on 03/20/2020)    . pantoprazole (PROTONIX) 40 MG tablet 1 tab(s)    . QUEtiapine (SEROQUEL) 50 MG tablet 1 tab(s)     No facility-administered medications prior to visit.  Review of Systems  HENT: Positive for postnasal drip.   Respiratory: Negative for cough, shortness of breath, wheezing and stridor.   Cardiovascular: Negative.        Claudication RLE  Gastrointestinal: Negative.   Genitourinary: Negative.   Musculoskeletal: Positive for back pain and gait problem.  Neurological: Positive for weakness. Negative for headaches.       Weak in both arms       Objective:   Physical Exam Vitals:   03/20/20 1500  BP: (!) 142/83  Pulse: 83  Resp: 20  Temp: 98.2 F (36.8 C)  SpO2: 97%  Weight: 171 lb (77.6 kg)    Gen: Pleasant, well-nourished, in no distress,  normal affect  ENT: No lesions,  mouth clear,  oropharynx clear, no postnasal drip  Neck: No JVD, no TMG, no carotid bruits  Lungs: No use of accessory muscles, no dullness to percussion, distant breath sounds  Cardiovascular: RRR, heart sounds normal, no murmur or gallops, no peripheral edema Decreased pulses in both right and left lower extremity  Abdomen: soft and NT, no HSM,  BS normal  Musculoskeletal: No deformities, no cyanosis or clubbing  Neuro: alert, non focal  Skin: Warm, no lesions or rashes  Screening ABI done shows peripheral artery disease in the left lower extremity but not the right lower extremity      Assessment & Plan:  I personally reviewed all images and lab data in the [facility] system as well as any outside material available during this office visit and agree with the  radiology impressions.   Coronary artery disease Coronary  artery disease with previous history of NSTEMI in 2017 with subsequent three-vessel bypass surgery  Will resume aspirin 325 mg daily, continue atorvastatin 80 mg daily, begin Coreg 12.5 mg twice daily for blood pressure control, begin lisinopril 10 mg daily.  Will refer to cardiology for further assessments   Essential hypertension For hypertension we will resume beta-blocker Coreg 12.5 mg twice daily and resume lisinopril 10 mg daily  Peripheral vascular disease (HCC) Peripheral vascular disease present on ABI we will refer to vascular surgery for further assessment  Gastro-esophageal reflux disease without esophagitis Significant reflux disease we will begin Protonix 40 mg daily  Type 2 diabetes mellitus without complications (HCC) Type 2 diabetes with vascular complication A1c greater than 6  Begin metformin 500 mg twice daily and follow-up lab data we will also obtain urine for microalbumin  Bipolar 1 disorder (HCC) Bipolar disorder with disorganized schizophrenia  Plan to discontinue Depakote and begin Seroquel 50 mg at bedtime trazodone 100 mg at bedtime  We will endeavor to get this patient into mental health clinic  Cocaine abuse (HCC) Not currently using  Disorganized schizophrenia (HCC) As per bipolar assessment  Pain in joint, pelvic region and thigh Continued joint pain present will observe Begin Robaxin as needed  Tobacco user Ongoing tobacco use counseling given     . Current smoking consumption amount: 1 pack a day  . Dicsussion on advise to quit smoking and smoking impacts: Cardiovascular lung impacts  . Patient's willingness to quit: Not yet ready  . Methods to quit smoking discussed: Nicotine replacement  . Medication management of smoking session drugs discussed: Nicotine replacement however not indicated due to vascular disease  . Resources provided:  AVS   Setting quit date not established . Follow-up arranged 1 month   Time spent  counseling the patient: 5 minutes   COPD with chronic bronchitis (HCC) COPD with active bronchitis  Begin azithromycin for 5 days  Begin Symbicort and albuterol   Diagnoses and all orders for this visit:  Coronary artery disease due to calcified coronary lesion -     CBC with Differential/Platelet -     Lipid panel -     POCT ABI Screening Pilot No Charge -     Ambulatory referral to Cardiology  Type 2 diabetes mellitus with diabetic peripheral angiopathy without gangrene, without long-term current use of insulin (HCC) -     Comprehensive metabolic panel -     Microalbumin / creatinine urine ratio -     HgB A1c -     Lipid panel  Peripheral vascular disease (HCC) -     CBC with Differential/Platelet -     POCT ABI Screening Pilot No Charge -     Ambulatory referral to Vascular Surgery  Essential hypertension -     Comprehensive metabolic panel -     CBC with Differential/Platelet  Type 2 diabetes mellitus with diabetic peripheral angiopathy without gangrene, without long-term current use of insulin (HCC) -     Comprehensive metabolic panel -     Microalbumin / creatinine urine ratio -     HgB A1c -     Lipid panel  Encounter for screening for HIV -     HIV Antibody (routine testing w rflx)  Need for hepatitis C screening test -     HCV Ab w Reflex to Quant PCR  Need for immunization against influenza -     Flu Vaccine QUAD 36+ mos IM  Gastro-esophageal reflux disease without esophagitis  Type 2 diabetes mellitus without complication, without long-term current use of insulin (HCC)  Bipolar 1 disorder (HCC)  Cocaine abuse (HCC)  Disorganized schizophrenia (HCC)  Pain in joint involving right pelvic region and thigh  Tobacco user  COPD with chronic bronchitis (HCC)  Other orders -     Albuterol Sulfate (PROAIR RESPICLICK) 108 (90 Base) MCG/ACT AEPB; Inhale 2 puffs into the lungs every 6 (six) hours as needed. -     azithromycin (ZITHROMAX) 250 MG  tablet; Take two once then one daily until gone -     carvedilol (COREG) 12.5 MG tablet; Take 1 tablet (12.5 mg total) by mouth 2 (two) times daily with a meal. -     pantoprazole (PROTONIX) 40 MG tablet; Take 1 tablet (40 mg total) by mouth daily before breakfast. -     QUEtiapine (SEROQUEL) 50 MG tablet; Take 1 tablet (50 mg total) by mouth at bedtime. -     aspirin 325 MG tablet; Take 1 tablet (325 mg total) by mouth daily. -     methocarbamol (ROBAXIN) 500 MG tablet; Take 1 tablet (500 mg total) by mouth every 6 (six) hours as needed for muscle spasms. -     fluticasone (FLONASE) 50 MCG/ACT nasal spray; Place 2 sprays into both nostrils daily. -     budesonide-formoterol (SYMBICORT) 160-4.5 MCG/ACT inhaler; Inhale 2 puffs into the lungs 2 (two) times daily. -     lisinopril (ZESTRIL) 10 MG tablet; Take 1 tablet (10 mg total) by mouth daily. -     metFORMIN (GLUCOPHAGE) 500 MG tablet; Take 1 tablet (500 mg total) by mouth 2 (two) times daily with a meal. -     Pneumococcal conjugate vaccine 13-valent -     Tdap vaccine greater than or equal to 7yo IM   Patient received Pneumovax tetanus vaccine and flu vaccine

## 2020-03-20 ENCOUNTER — Telehealth: Payer: Self-pay

## 2020-03-20 ENCOUNTER — Ambulatory Visit: Payer: Medicare HMO | Attending: Critical Care Medicine | Admitting: Critical Care Medicine

## 2020-03-20 ENCOUNTER — Encounter: Payer: Self-pay | Admitting: Critical Care Medicine

## 2020-03-20 ENCOUNTER — Other Ambulatory Visit: Payer: Self-pay

## 2020-03-20 ENCOUNTER — Other Ambulatory Visit: Payer: Self-pay | Admitting: Critical Care Medicine

## 2020-03-20 VITALS — BP 142/83 | HR 83 | Temp 98.2°F | Resp 20 | Wt 171.0 lb

## 2020-03-20 DIAGNOSIS — E119 Type 2 diabetes mellitus without complications: Secondary | ICD-10-CM | POA: Insufficient documentation

## 2020-03-20 DIAGNOSIS — E1151 Type 2 diabetes mellitus with diabetic peripheral angiopathy without gangrene: Secondary | ICD-10-CM

## 2020-03-20 DIAGNOSIS — I739 Peripheral vascular disease, unspecified: Secondary | ICD-10-CM | POA: Diagnosis not present

## 2020-03-20 DIAGNOSIS — I2584 Coronary atherosclerosis due to calcified coronary lesion: Secondary | ICD-10-CM

## 2020-03-20 DIAGNOSIS — F1410 Cocaine abuse, uncomplicated: Secondary | ICD-10-CM

## 2020-03-20 DIAGNOSIS — M25551 Pain in right hip: Secondary | ICD-10-CM

## 2020-03-20 DIAGNOSIS — I10 Essential (primary) hypertension: Secondary | ICD-10-CM

## 2020-03-20 DIAGNOSIS — M25559 Pain in unspecified hip: Secondary | ICD-10-CM | POA: Insufficient documentation

## 2020-03-20 DIAGNOSIS — J449 Chronic obstructive pulmonary disease, unspecified: Secondary | ICD-10-CM

## 2020-03-20 DIAGNOSIS — F319 Bipolar disorder, unspecified: Secondary | ICD-10-CM

## 2020-03-20 DIAGNOSIS — Z114 Encounter for screening for human immunodeficiency virus [HIV]: Secondary | ICD-10-CM

## 2020-03-20 DIAGNOSIS — K219 Gastro-esophageal reflux disease without esophagitis: Secondary | ICD-10-CM

## 2020-03-20 DIAGNOSIS — I2510 Atherosclerotic heart disease of native coronary artery without angina pectoris: Secondary | ICD-10-CM | POA: Diagnosis not present

## 2020-03-20 DIAGNOSIS — Z720 Tobacco use: Secondary | ICD-10-CM

## 2020-03-20 DIAGNOSIS — F17210 Nicotine dependence, cigarettes, uncomplicated: Secondary | ICD-10-CM | POA: Diagnosis not present

## 2020-03-20 DIAGNOSIS — Z23 Encounter for immunization: Secondary | ICD-10-CM | POA: Diagnosis not present

## 2020-03-20 DIAGNOSIS — Z5901 Sheltered homelessness: Secondary | ICD-10-CM

## 2020-03-20 DIAGNOSIS — J4489 Other specified chronic obstructive pulmonary disease: Secondary | ICD-10-CM

## 2020-03-20 DIAGNOSIS — F201 Disorganized schizophrenia: Secondary | ICD-10-CM

## 2020-03-20 DIAGNOSIS — Z1159 Encounter for screening for other viral diseases: Secondary | ICD-10-CM

## 2020-03-20 HISTORY — DX: Bipolar disorder, unspecified: F31.9

## 2020-03-20 HISTORY — DX: Other specified chronic obstructive pulmonary disease: J44.89

## 2020-03-20 HISTORY — DX: Sheltered homelessness: Z59.01

## 2020-03-20 HISTORY — DX: Chronic obstructive pulmonary disease, unspecified: J44.9

## 2020-03-20 HISTORY — DX: Pain in unspecified hip: M25.559

## 2020-03-20 HISTORY — DX: Gastro-esophageal reflux disease without esophagitis: K21.9

## 2020-03-20 HISTORY — DX: Tobacco use: Z72.0

## 2020-03-20 HISTORY — DX: Essential (primary) hypertension: I10

## 2020-03-20 MED ORDER — CARVEDILOL 12.5 MG PO TABS: 12.5000 mg | ORAL_TABLET | Freq: Two times a day (BID) | ORAL | 3 refills | Status: DC

## 2020-03-20 MED ORDER — QUETIAPINE FUMARATE 50 MG PO TABS: 50.0000 mg | ORAL_TABLET | Freq: Every day | ORAL | 1 refills | Status: DC

## 2020-03-20 MED ORDER — FLUTICASONE PROPIONATE 50 MCG/ACT NA SUSP: 2.0000 | Freq: Every day | NASAL | 6 refills | Status: DC

## 2020-03-20 MED ORDER — AZITHROMYCIN 250 MG PO TABS: ORAL_TABLET | ORAL | 0 refills | Status: DC

## 2020-03-20 MED ORDER — METHOCARBAMOL 500 MG PO TABS: 500.0000 mg | ORAL_TABLET | Freq: Four times a day (QID) | ORAL | 1 refills | Status: DC | PRN

## 2020-03-20 MED ORDER — LISINOPRIL 10 MG PO TABS: 10.0000 mg | ORAL_TABLET | Freq: Every day | ORAL | 1 refills | Status: DC

## 2020-03-20 MED ORDER — METFORMIN HCL 500 MG PO TABS: 500.0000 mg | ORAL_TABLET | Freq: Two times a day (BID) | ORAL | 3 refills | Status: DC

## 2020-03-20 MED ORDER — PROAIR RESPICLICK 108 (90 BASE) MCG/ACT IN AEPB: 2.0000 | INHALATION_SPRAY | Freq: Four times a day (QID) | RESPIRATORY_TRACT | 4 refills | Status: DC | PRN

## 2020-03-20 MED ORDER — BUDESONIDE-FORMOTEROL FUMARATE 160-4.5 MCG/ACT IN AERO: 2.0000 | INHALATION_SPRAY | Freq: Two times a day (BID) | RESPIRATORY_TRACT | 12 refills | Status: DC

## 2020-03-20 MED ORDER — PANTOPRAZOLE SODIUM 40 MG PO TBEC: 40.0000 mg | DELAYED_RELEASE_TABLET | Freq: Every day | ORAL | 3 refills | Status: DC

## 2020-03-20 MED ORDER — ASPIRIN 325 MG PO TABS: 325.0000 mg | ORAL_TABLET | Freq: Every day | ORAL | 2 refills | Status: DC

## 2020-03-20 NOTE — Assessment & Plan Note (Signed)
As per bipolar assessment 

## 2020-03-20 NOTE — Assessment & Plan Note (Signed)
Coronary artery disease with previous history of NSTEMI in 2017 with subsequent three-vessel bypass surgery  Will resume aspirin 325 mg daily, continue atorvastatin 80 mg daily, begin Coreg 12.5 mg twice daily for blood pressure control, begin lisinopril 10 mg daily.  Will refer to cardiology for further assessments

## 2020-03-20 NOTE — Assessment & Plan Note (Addendum)
Continued joint pain present will observe Begin Robaxin as needed

## 2020-03-20 NOTE — Assessment & Plan Note (Signed)
COPD with active bronchitis  Begin azithromycin for 5 days  Begin Symbicort and albuterol

## 2020-03-20 NOTE — Progress Notes (Signed)
C/o right tingling in hands. Right is worse than left hand.   Pain in right leg 7/10.  Falling at home more so Staying at the shelter but is looking for house.   Lived in [place] x 4 mos. Patient is from [facility], [place].    Has cough

## 2020-03-20 NOTE — Assessment & Plan Note (Signed)
Peripheral vascular disease present on ABI we will refer to vascular surgery for further assessment

## 2020-03-20 NOTE — Assessment & Plan Note (Signed)
Type 2 diabetes with vascular complication A1c greater than 6  Begin metformin 500 mg twice daily and follow-up lab data we will also obtain urine for microalbumin

## 2020-03-20 NOTE — Telephone Encounter (Signed)
   Notes to clinic:   Alternative Requested:NON-FORMULARY.  Requested Prescriptions  Pending Prescriptions Disp Refills   cyclobenzaprine (FLEXERIL) 5 MG tablet [Pharmacy Med Name: CYCLOBENZAPRINE 5 MG TABLET] 30 tablet 0      Not Delegated - Analgesics:  Muscle Relaxants Failed - 03/20/2020  5:50 PM      Failed - This refill cannot be delegated      Passed - Valid encounter within last 6 months    Recent Outpatient Visits           Today Coronary artery disease due to calcified coronary lesion   [facility] Storm Frisk, MD       Future Appointments             In 1 month Delford Field Charlcie Cradle, MD [facility]               VENTOLIN HFA 108 [phone] Base) MCG/ACT inhaler [Pharmacy Med Name: VENTOLIN HFA 90 MCG INHALER]  0      Pulmonology:  Beta Agonists Failed - 03/20/2020  5:50 PM      Failed - One inhaler should last at least one month. If the patient is requesting refills earlier, contact the patient to check for uncontrolled symptoms.      Passed - Valid encounter within last 12 months    Recent Outpatient Visits           Today Coronary artery disease due to calcified coronary lesion   [facility] Storm Frisk, MD       Future Appointments             In 1 month Delford Field Charlcie Cradle, MD [facility]

## 2020-03-20 NOTE — Patient Instructions (Addendum)
Your screening study shows decreased circulation in your left leg I am going to have you referred to vascular surgery to evaluate both lower extremities for circulation  Referral back to cardiology is made for them to reassess your coronary artery status and heart status  A multiple list of [facility] and I am going to go over all this with you now here in the clinic before you leave see your new med list  Labs today will be obtained to reassess your health status  Return to see Dr. Delford Field at the clinic in 1 month and I will check in with you at the shelter  Pneumonia tetanus and flu vaccines were given  Please obtain a COVID booster vaccine below is the number he can call to see if we can obtain one COVID-19 Vaccine Information can be found at: [email or web address] For questions related to vaccine distribution or appointments, please email [email or web address] or call [phone].

## 2020-03-20 NOTE — Assessment & Plan Note (Signed)
For hypertension we will resume beta-blocker Coreg 12.5 mg twice daily and resume lisinopril 10 mg daily

## 2020-03-20 NOTE — Assessment & Plan Note (Signed)
Bipolar disorder with disorganized schizophrenia  Plan to discontinue Depakote and begin Seroquel 50 mg at bedtime trazodone 100 mg at bedtime  We will endeavor to get this patient into mental health clinic

## 2020-03-20 NOTE — Assessment & Plan Note (Addendum)
Ongoing tobacco use counseling given     . Current smoking consumption amount: 1 pack a day  . Dicsussion on advise to quit smoking and smoking impacts: Cardiovascular lung impacts  . Patient's willingness to quit: Not yet ready  . Methods to quit smoking discussed: Nicotine replacement  . Medication management of smoking session drugs discussed: Nicotine replacement however not indicated due to vascular disease  . Resources provided:  AVS   Setting quit date not established . Follow-up arranged 1 month   Time spent counseling the patient: 5 minutes

## 2020-03-20 NOTE — Assessment & Plan Note (Signed)
Currently resides in [facility] [employer]  Will partner with case management on housing options as the patient proceeds

## 2020-03-20 NOTE — Assessment & Plan Note (Signed)
Significant reflux disease we will begin Protonix 40 mg daily

## 2020-03-20 NOTE — Assessment & Plan Note (Signed)
Not currently using

## 2020-03-20 NOTE — Telephone Encounter (Signed)
Spoke to [employer] and registered patient for services. Patient signed waiver and it was faxed to Transportation services. Ride scheduled to transport patient back to [employer]

## 2020-03-21 LAB — POCT GLYCOSYLATED HEMOGLOBIN (HGB A1C): HbA1c, POC (prediabetic range): 6.3 % (ref 5.7–6.4)

## 2020-03-21 LAB — CBC WITH DIFFERENTIAL/PLATELET
Basophils Absolute: 0 10*3/uL (ref 0.0–0.2)
Basos: 0 %
EOS (ABSOLUTE): 0.1 10*3/uL (ref 0.0–0.4)
Eos: 1 %
Hematocrit: 46.1 % (ref 37.5–51.0)
Hemoglobin: 16 g/dL (ref 13.0–17.7)
Immature Grans (Abs): 0 10*3/uL (ref 0.0–0.1)
Immature Granulocytes: 0 %
Lymphocytes Absolute: 0.9 10*3/uL (ref 0.7–3.1)
Lymphs: 9 %
MCH: 28.9 pg (ref 26.6–33.0)
MCHC: 34.7 g/dL (ref 31.5–35.7)
MCV: 83 fL (ref 79–97)
Monocytes Absolute: 0.8 10*3/uL (ref 0.1–0.9)
Monocytes: 9 %
Neutrophils Absolute: 7.9 10*3/uL — ABNORMAL HIGH (ref 1.4–7.0)
Neutrophils: 81 %
Platelets: 249 10*3/uL (ref 150–450)
RBC: 5.54 x10E6/uL (ref 4.14–5.80)
RDW: 12.8 % (ref 11.6–15.4)
WBC: 9.7 10*3/uL (ref 3.4–10.8)

## 2020-03-21 LAB — COMPREHENSIVE METABOLIC PANEL
ALT: 12 IU/L (ref 0–44)
AST: 16 IU/L (ref 0–40)
Albumin/Globulin Ratio: 1.5 (ref 1.2–2.2)
Albumin: 4.4 g/dL (ref 3.8–4.8)
Alkaline Phosphatase: 138 IU/L — ABNORMAL HIGH (ref 44–121)
BUN/Creatinine Ratio: 11 (ref 10–24)
BUN: 10 mg/dL (ref 8–27)
Bilirubin Total: 1.1 mg/dL (ref 0.0–1.2)
CO2: 27 mmol/L (ref 20–29)
Calcium: 10 mg/dL (ref 8.6–10.2)
Chloride: 101 mmol/L (ref 96–106)
Creatinine, Ser: 0.91 mg/dL (ref 0.76–1.27)
GFR calc Af Amer: 100 mL/min/{1.73_m2} (ref >59)
GFR calc non Af Amer: 87 mL/min/{1.73_m2} (ref >59)
Globulin, Total: 3 g/dL (ref 1.5–4.5)
Glucose: 90 mg/dL (ref 65–99)
Potassium: 4.7 mmol/L (ref 3.5–5.2)
Sodium: 141 mmol/L (ref 134–144)
Total Protein: 7.4 g/dL (ref 6.0–8.5)

## 2020-03-21 LAB — LIPID PANEL
Chol/HDL Ratio: 4 ratio (ref 0.0–5.0)
Cholesterol, Total: 206 mg/dL — ABNORMAL HIGH (ref 100–199)
HDL: 51 mg/dL (ref >39)
LDL Chol Calc (NIH): 142 mg/dL — ABNORMAL HIGH (ref 0–99)
Triglycerides: 70 mg/dL (ref 0–149)
VLDL Cholesterol Cal: 13 mg/dL (ref 5–40)

## 2020-03-21 LAB — MICROALBUMIN / CREATININE URINE RATIO
Creatinine, Urine: 190.7 mg/dL
Microalb/Creat Ratio: 5 mg/g creat (ref 0–29)
Microalbumin, Urine: 9 ug/mL

## 2020-03-21 LAB — HIV ANTIBODY (ROUTINE TESTING W REFLEX): HIV Screen 4th Generation wRfx: NONREACTIVE

## 2020-03-21 LAB — HCV AB W REFLEX TO QUANT PCR: HCV Ab: <0.1 s/co ratio (ref 0.0–0.9)

## 2020-03-21 LAB — HCV INTERPRETATION

## 2020-03-26 ENCOUNTER — Encounter: Payer: Self-pay | Admitting: *Deleted

## 2020-03-28 ENCOUNTER — Encounter: Payer: Self-pay | Admitting: Critical Care Medicine

## 2020-03-28 NOTE — Progress Notes (Signed)
Patient seen today in the [place] [facility] clinic 70 year old male with history of peripheral vascular disease COPD previous bypass surgery coronary disease diabetes  He wanted to have all his medications reviewed I went over all his recent prescriptions that he picked up and went over each and everyone and also instructed him as to proper use of his inhalers  We also establish for this patient appointment with vascular surgery Friday, February 11 10 AM with Dr. Randie Heinz  No other issues were identified

## 2020-03-29 ENCOUNTER — Other Ambulatory Visit: Payer: Self-pay

## 2020-03-29 DIAGNOSIS — I739 Peripheral vascular disease, unspecified: Secondary | ICD-10-CM

## 2020-04-05 DIAGNOSIS — I2510 Atherosclerotic heart disease of native coronary artery without angina pectoris: Secondary | ICD-10-CM | POA: Insufficient documentation

## 2020-04-05 DIAGNOSIS — I10 Essential (primary) hypertension: Secondary | ICD-10-CM | POA: Insufficient documentation

## 2020-04-05 DIAGNOSIS — M1990 Unspecified osteoarthritis, unspecified site: Secondary | ICD-10-CM | POA: Insufficient documentation

## 2020-04-05 DIAGNOSIS — R079 Chest pain, unspecified: Secondary | ICD-10-CM | POA: Insufficient documentation

## 2020-04-05 DIAGNOSIS — J449 Chronic obstructive pulmonary disease, unspecified: Secondary | ICD-10-CM | POA: Insufficient documentation

## 2020-04-05 DIAGNOSIS — M549 Dorsalgia, unspecified: Secondary | ICD-10-CM | POA: Insufficient documentation

## 2020-04-05 DIAGNOSIS — G8929 Other chronic pain: Secondary | ICD-10-CM | POA: Insufficient documentation

## 2020-04-05 DIAGNOSIS — N529 Male erectile dysfunction, unspecified: Secondary | ICD-10-CM | POA: Insufficient documentation

## 2020-04-05 DIAGNOSIS — E119 Type 2 diabetes mellitus without complications: Secondary | ICD-10-CM | POA: Insufficient documentation

## 2020-04-05 DIAGNOSIS — K219 Gastro-esophageal reflux disease without esophagitis: Secondary | ICD-10-CM | POA: Insufficient documentation

## 2020-04-05 DIAGNOSIS — J189 Pneumonia, unspecified organism: Secondary | ICD-10-CM | POA: Insufficient documentation

## 2020-04-05 DIAGNOSIS — D649 Anemia, unspecified: Secondary | ICD-10-CM | POA: Insufficient documentation

## 2020-04-06 ENCOUNTER — Other Ambulatory Visit: Payer: Self-pay | Admitting: Critical Care Medicine

## 2020-04-06 NOTE — Telephone Encounter (Signed)
Non delegated refill request for Depakote.

## 2020-04-06 NOTE — Telephone Encounter (Signed)
Requested medication (s) are due for refill today: yes  Requested medication (s) are on the active medication list: yes  Last refill:  03/14/20  #30  0 refills  Future visit scheduled: No  Notes to clinic:  pharmacy asking for 90 day. Patient has OV 1 month scheduled    Requested Prescriptions  Pending Prescriptions Disp Refills   traZODone (DESYREL) 100 MG tablet [Pharmacy Med Name: TRAZODONE 100 MG TABLET] 90 tablet 1    Sig: TAKE 1 TABLET BY MOUTH AT BEDTIME AS NEEDED FOR SLEEP.      Psychiatry: Antidepressants - Serotonin Modulator Passed - 04/06/2020 12:30 PM      Passed - Valid encounter within last 6 months    Recent Outpatient Visits           2 weeks ago Coronary artery disease due to calcified coronary lesion   [facility] Storm Frisk, MD       Future Appointments             In 1 week Dulce Sellar, Iline Oven, MD [facility]   In 1 month Delford Field Charlcie Cradle, MD [facility]

## 2020-04-11 ENCOUNTER — Encounter: Payer: Self-pay | Admitting: Physician Assistant

## 2020-04-11 NOTE — Progress Notes (Signed)
Pt is having severe problems w/ reflux  He gets it after meals. It will last all night.  He is completely out of meds.  No melena, no vomiting. No rectal bleeding  Had heartburn all night last night because was out of meds.  Has not been light-headed or dizzy.  He is still smoking, trying to quit.  Drinks 1-2 days a month, but has not had a drink in about a month.  Does not think he has the Protonix, will look for it when he goes upstairs.  He has an appt with Dr Dulce Sellar w/ Cardiology on 02/04, will need transport.  Theodore Demark, PA-C 04/11/2020 3:11 PM

## 2020-04-12 NOTE — Progress Notes (Deleted)
Cardiology Office Note:    Date:  04/12/2020   ID:  Andre Rojas, DOB [birth year], MRN [id number]  PCP:  Storm Frisk, MD  Cardiologist:  Norman Herrlich, MD    Referring MD: Storm Frisk, MD    ASSESSMENT:    No diagnosis found. PLAN:    In order of problems listed above:  1. ***   Next appointment: ***   Medication Adjustments/Labs and Tests Ordered: Current medicines are reviewed at length with the patient today.  Concerns regarding medicines are outlined above.  No orders of the defined types were placed in this encounter.  No orders of the defined types were placed in this encounter.   No chief complaint on file.   History of Present Illness:    Andre Rojas is a 70 y.o. male with a hx of CAD with non-STEMI 2017 and subsequent CABG, hypertension type 2 diabetes hyperlipidemia ongoing cigarette smoking and peripheral vascular disease.  Other problems include COPD.  He was referred by Dr. Shan Levans to reestablish cardiology care.  His CABG was 06/29/2015 to [facility] Dr. Dorris Fetch utilizing left thoracic artery to LAD vein graft to the PDA and vein graft to the ramus intermediate.  His postoperative course was uncomplicated and he had plans to return to [place] to live with his daughter at that time.  Ejection fraction was 60% Compliance with diet, lifestyle and medications: ***  Recent lipid profile shows an LDL of 142. Past Medical History:  Diagnosis Date  . Acid reflux   . Anemia   . Arthritis    hip, right  . Bipolar 1 disorder (HCC) 03/20/2020  . CAD (coronary artery disease)   . Chest pain   . Chronic back pain   . Cocaine abuse (HCC) 10/16/2012  . COPD (chronic obstructive pulmonary disease) (HCC)   . COPD with chronic bronchitis (HCC) 03/20/2020  . Coronary artery disease 06/26/2015  . Diabetes (HCC)   . 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 Erectile dysfunction   . Essential hypertension 03/20/2020  . Gastro-esophageal  reflux disease without esophagitis 03/20/2020  . History of non-ST elevation myocardial infarction (NSTEMI) 06/26/2015   06/01/2015 in [place]   . Hyperlipidemia   . Hypertension   . Lives in homeless shelter 03/20/2020  . Pain in joint, pelvic region and thigh 03/20/2020  . Peripheral vascular disease (HCC)    right leg stays cold  . Pneumonia   . Psychosis (HCC) 10/16/2012  . S/P CABG x 3 06/29/2015  . Tobacco user 03/20/2020    Past Surgical History:  Procedure Laterality Date  . CARDIAC CATHETERIZATION  05/2015  . COLONOSCOPY    . CORONARY ARTERY BYPASS GRAFT N/A 06/29/2015   Procedure: CORONARY ARTERY BYPASS GRAFTING (CABG) TIMES THREE USING LEFT INTERNAL MAMMARY ARTERY AND RIGHT GREATER SAPHENOUS VEIN HARVESTED BY ENDOVEIN;  Surgeon: Loreli Slot, MD;  Location: [facility] OR;  Service: Open Heart Surgery;  Laterality: N/A;  . KNEE SURGERY Left   . TEE WITHOUT CARDIOVERSION N/A 06/29/2015   Procedure: TRANSESOPHAGEAL ECHOCARDIOGRAM (TEE);  Surgeon: Loreli Slot, MD;  Location[facility];  Service: Open Heart Surgery;  Laterality: N/A;    Current Medications: No outpatient medications have been marked as taking for the 04/13/20 encounter (Appointment) with Baldo Daub, MD.     Allergies:   Patient has no known allergies.   Social History   Socioeconomic History  . Marital status: Single    Spouse name: Not on file  .  Number of children: Not on file  . Years of education: Not on file  . Highest education level: Not on file  Occupational History  . Not on file  Tobacco Use  . Smoking status: Current Every Day Smoker    Packs/day: 0.25    Years: 40.00    Pack years: 10.00    Types: Cigarettes  . Smokeless tobacco: Never Used  Vaping Use  . Vaping Use: Never used  Substance and Sexual Activity  . Alcohol use: Yes    Comment: 1/5 per day - Pt states he may only have one or 2 drinks if he drinks any (noted 06/27/15)  . Drug use: Yes    Types: "Crack" cocaine,  Cocaine    Comment: yesterday   . Sexual activity: Yes    Birth control/protection: Condom  Other Topics Concern  . Not on file  Social History Narrative  . Not on file   Social Determinants of Health   Financial Resource Strain: Not on file  Food Insecurity: Not on file  Transportation Needs: Not on file  Physical Activity: Not on file  Stress: Not on file  Social Connections: Not on file     Family History: The patient's ***family history includes Cancer in his mother; Heart disease in his father. ROS:   Please see the history of present illness.    All other systems reviewed and are negative.  EKGs/Labs/Other Studies Reviewed:    The following studies were reviewed today:  EKG:  EKG ordered today and personally reviewed.  The ekg ordered today demonstrates ***  Recent Labs: 03/20/2020: ALT 12; BUN 10; Creatinine, Ser 0.91; Hemoglobin 16.0; Platelets 249; Potassium 4.7; Sodium 141  Recent Lipid Panel    Component Value Date/Time   CHOL 206 (H) 03/20/2020 1631   TRIG 70 03/20/2020 1631   HDL 51 03/20/2020 1631   CHOLHDL 4.0 03/20/2020 1631   CHOLHDL 4.7 12/16/2017 0603   VLDL 20 12/16/2017 0603   LDLCALC 142 (H) 03/20/2020 1631    Physical Exam:    VS:  There were no vitals taken for this visit.    Wt Readings from Last 3 Encounters:  03/20/20 171 lb (77.6 kg)  12/15/17 175 lb (79.4 kg)  07/17/15 168 lb (76.2 kg)     GEN: *** Well nourished, well developed in no acute distress HEENT: Normal NECK: No JVD; No carotid bruits LYMPHATICS: No lymphadenopathy CARDIAC: ***RRR, no murmurs, rubs, gallops RESPIRATORY:  Clear to auscultation without rales, wheezing or rhonchi  ABDOMEN: Soft, non-tender, non-distended MUSCULOSKELETAL:  No edema; No deformity  SKIN: Warm and dry NEUROLOGIC:  Alert and oriented x 3 PSYCHIATRIC:  Normal affect    Signed, Norman Herrlich, MD  04/12/2020 3:56 PM    [facility]

## 2020-04-13 ENCOUNTER — Ambulatory Visit: Payer: Medicare HMO | Admitting: Cardiology

## 2020-04-18 ENCOUNTER — Encounter: Payer: Self-pay | Admitting: Critical Care Medicine

## 2020-04-18 ENCOUNTER — Other Ambulatory Visit: Payer: Self-pay | Admitting: Critical Care Medicine

## 2020-04-18 DIAGNOSIS — F319 Bipolar disorder, unspecified: Secondary | ICD-10-CM

## 2020-04-18 MED ORDER — QUETIAPINE FUMARATE 50 MG PO TABS
50.0000 mg | ORAL_TABLET | Freq: Every day | ORAL | 1 refills | Status: DC
Start: 2020-04-18 — End: 2020-05-03

## 2020-04-19 NOTE — Progress Notes (Signed)
Patient ID: Andre Rojas, male   DOB: Sep 24, 1950, 70 y.o.   MRN: [id number] This is a 70 year old male here for follow-up at the shelter.  He has history of coronary disease and also abnormal ABI in the lower extremity.  Patient recently lost his phone and is had his Seroquel stolen from him  Patient denies any shortness of breath or chest pain currently he still smoking a pack of cigarettes every 2 days.  On exam there is no overall change in his exam from 4  We identified his appointment with vascular surgery is this week and he missed his cardiology appointment so we reschedule another cardiology appointment in February and he knows to keep these appointments he will have a nurse case manager assist in getting him the appointments  I sent a refill to his [facility] for the Seroquel that has been missing

## 2020-04-20 ENCOUNTER — Other Ambulatory Visit: Payer: Self-pay

## 2020-04-20 ENCOUNTER — Ambulatory Visit (INDEPENDENT_AMBULATORY_CARE_PROVIDER_SITE_OTHER): Payer: Medicare HMO | Admitting: Vascular Surgery

## 2020-04-20 ENCOUNTER — Ambulatory Visit (HOSPITAL_COMMUNITY)
Admission: RE | Admit: 2020-04-20 | Discharge: 2020-04-20 | Disposition: A | Discharge status: Home / Self Care | Payer: Medicare HMO | Source: Ambulatory Visit | Attending: Vascular Surgery | Admitting: Vascular Surgery

## 2020-04-20 ENCOUNTER — Encounter: Payer: Self-pay | Admitting: Vascular Surgery

## 2020-04-20 VITALS — BP 136/85 | HR 68 | Temp 98.1°F | Resp 20 | Ht 68.5 in | Wt 168.0 lb

## 2020-04-20 DIAGNOSIS — I739 Peripheral vascular disease, unspecified: Secondary | ICD-10-CM | POA: Insufficient documentation

## 2020-04-20 NOTE — Progress Notes (Signed)
Patient ID: Andre Rojas, male   DOB: [birth year], 70 y.o.   MRN: [id number]  Reason for Consult: New Patient (Initial Visit)   Referred by Storm Frisk, MD  Subjective:     HPI:  Andre Rojas is a 70 y.o. male here for evaluation with abnormal point-of-care ABIs.  He has cold right hand and right lower extremity.  He states that his right hand is always numb.  He does not have any tissue loss or ulceration of his lower extremities or upper extremities.  He does have history of a small stroke with minimal residual deficit.  He has a history of prior coronary artery bypass graft he does not have any personal or family history of aneurysm disease.  Patient walks with help of a cane.  He states he can walk as far as he wants does not have any claudication type symptoms.  He is a current every day smoker  Past Medical History:  Diagnosis Date  . Acid reflux   . Anemia   . Arthritis    hip, right  . Bipolar 1 disorder (HCC) 03/20/2020  . CAD (coronary artery disease)   . Chest pain   . Chronic back pain   . Cocaine abuse (HCC) 10/16/2012  . COPD (chronic obstructive pulmonary disease) (HCC)   . COPD with chronic bronchitis (HCC) 03/20/2020  . Coronary artery disease 06/26/2015  . Diabetes (HCC)   . 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 Erectile dysfunction   . Essential hypertension 03/20/2020  . Gastro-esophageal reflux disease without esophagitis 03/20/2020  . History of non-ST elevation myocardial infarction (NSTEMI) 06/26/2015   06/01/2015 in [place]   . Hyperlipidemia   . Hypertension   . Lives in homeless shelter 03/20/2020  . Pain in joint, pelvic region and thigh 03/20/2020  . Peripheral vascular disease (HCC)    right leg stays cold  . Pneumonia   . Psychosis (HCC) 10/16/2012  . S/P CABG x 3 06/29/2015  . Tobacco user 03/20/2020   Family History  Problem Relation Age of Onset  . Cancer Mother        stomach  . Heart disease Father    Past Surgical  History:  Procedure Laterality Date  . CARDIAC CATHETERIZATION  05/2015  . COLONOSCOPY    . CORONARY ARTERY BYPASS GRAFT N/A 06/29/2015   Procedure: CORONARY ARTERY BYPASS GRAFTING (CABG) TIMES THREE USING LEFT INTERNAL MAMMARY ARTERY AND RIGHT GREATER SAPHENOUS VEIN HARVESTED BY ENDOVEIN;  Surgeon: Loreli Slot, MD;  Location: [facility] OR;  Service: Open Heart Surgery;  Laterality: N/A;  . KNEE SURGERY Left   . TEE WITHOUT CARDIOVERSION N/A 06/29/2015   Procedure: TRANSESOPHAGEAL ECHOCARDIOGRAM (TEE);  Surgeon: Loreli Slot, MD;  Location: [facility] OR;  Service: Open Heart Surgery;  Laterality: N/A;    Short Social History:  Social History   Tobacco Use  . Smoking status: Current Every Day Smoker    Packs/day: 0.50    Years: 40.00    Pack years: 20.00    Types: Cigarettes  . Smokeless tobacco: Never Used  Substance Use Topics  . Alcohol use: Yes    Comment: 1/5 per day - Pt states he may only have one or 2 drinks if he drinks any (noted 06/27/15)    No Known Allergies  Current Outpatient Medications  Medication Sig Dispense Refill  . albuterol (VENTOLIN HFA) 108 (90 Base) MCG/ACT inhaler Inhale 2 puffs into the lungs every 6 (six) hours as needed for  wheezing or shortness of breath. 18 g 2  . aspirin 325 MG tablet Take 1 tablet (325 mg total) by mouth daily. 60 tablet 2  . atorvastatin (LIPITOR) 80 MG tablet Take 1 tablet (80 mg total) by mouth daily at 6 PM. 90 tablet 1  . azithromycin (ZITHROMAX) 250 MG tablet Take two once then one daily until gone 6 tablet 0  . budesonide-formoterol (SYMBICORT) 160-4.5 MCG/ACT inhaler Inhale 2 puffs into the lungs 2 (two) times daily. 1 each 12  . carvedilol (COREG) 12.5 MG tablet Take 1 tablet (12.5 mg total) by mouth 2 (two) times daily with a meal. 60 tablet 3  . cyclobenzaprine (FLEXERIL) 5 MG tablet Take 1 tablet (5 mg total) by mouth 3 (three) times daily. 90 tablet 1  . fluticasone (FLONASE) 50 MCG/ACT nasal spray Place 2 sprays into  both nostrils daily. 16 g 6  . lisinopril (ZESTRIL) 10 MG tablet Take 1 tablet (10 mg total) by mouth daily. 90 tablet 1  . metFORMIN (GLUCOPHAGE) 500 MG tablet Take 1 tablet (500 mg total) by mouth 2 (two) times daily with a meal. 180 tablet 3  . pantoprazole (PROTONIX) 40 MG tablet Take 1 tablet (40 mg total) by mouth daily before breakfast. 40 tablet 3  . QUEtiapine (SEROQUEL) 50 MG tablet Take 1 tablet (50 mg total) by mouth at bedtime. 40 tablet 1  . traZODone (DESYREL) 100 MG tablet TAKE 1 TABLET BY MOUTH AT BEDTIME AS NEEDED FOR SLEEP. 90 tablet 1   No current facility-administered medications for this visit.    Review of Systems  Constitutional:  Constitutional negative. HENT: HENT negative.  Eyes: Eyes negative.  Respiratory: Respiratory negative.  Cardiovascular: Cardiovascular negative.  GI: Gastrointestinal negative.  Musculoskeletal: Positive for gait problem.  Skin: Skin negative.  Neurological: Positive for numbness.  Hematologic: Hematologic/lymphatic negative.  Psychiatric: Psychiatric negative.        Objective:  Objective   Vitals:   04/20/20 1020  BP: 136/85  Pulse: 68  Resp: 20  Temp: 98.1 F (36.7 C)  SpO2: 98%  Weight: 168 lb (76.2 kg)  Height: 5' 8.5" (1.74 m)   Body mass index is 25.17 kg/m.  Physical Exam HENT:     Head: Normocephalic.     Nose:     Comments: Wearing a mask Eyes:     Pupils: Pupils are equal, round, and reactive to light.  Cardiovascular:     Pulses:          Radial pulses are 2+ on the right side and 2+ on the left side.       Femoral pulses are 2+ on the right side and 2+ on the left side.      Dorsalis pedis pulses are 0 on the right side and 1+ on the left side.       Posterior tibial pulses are 1+ on the right side.  Pulmonary:     Effort: Pulmonary effort is normal.  Abdominal:     General: Abdomen is flat.     Palpations: Abdomen is soft. There is no mass.  Neurological:     Mental Status: He is alert.      Data: ABI Findings:  +---------+------------------+-----+--------+--------+  Right  Rt Pressure (mmHg)IndexWaveformComment   +---------+------------------+-----+--------+--------+  Brachial 146                     +---------+------------------+-----+--------+--------+  ATA  absent       +---------+------------------+-----+--------+--------+  PTA   133        0.91 biphasic      +---------+------------------+-----+--------+--------+  Great Toe109        0.75 Normal       +---------+------------------+-----+--------+--------+   +---------+------------------+-----+--------+-------+  Left   Lt Pressure (mmHg)IndexWaveformComment  +---------+------------------+-----+--------+-------+  Brachial 144                     +---------+------------------+-----+--------+-------+  ATA   136        0.93 biphasic      +---------+------------------+-----+--------+-------+  PTA   140        0.96 biphasic      +---------+------------------+-----+--------+-------+  Great Toe126        0.86 Normal       +---------+------------------+-----+--------+-------+   +-------+-----------+-----------+------------+------------+  ABI/TBIToday's ABIToday's TBIPrevious ABIPrevious TBI  +-------+-----------+-----------+------------+------------+  Right 0.91          0.93            +-------+-----------+-----------+------------+------------+  Left  0.96          0.82            +-------+-----------+-----------+------------+------------+        Assessment/Plan:     70 year old male with right upper extremity numbness and coolness as well as coolness of his right foot.  ABIs are relatively well-preserved he actually has weakly palpable pulses bilaterally in his feet.   He has very strong palpable radial pulses bilaterally to suggest his issues are nonvascular.  Possibly has a neurologic issue of his right upper extremity.  ABIs are not normal.  I recommended smoking cessation and continued walking he can follow-up in 1 year with repeat ABIs.     Maeola Harman MD [facility]

## 2020-04-25 NOTE — Progress Notes (Signed)
Cardiology Office Note:    Date:  04/27/2020   ID:  Andre Rojas, DOB [birth year], MRN [id number]  PCP:  Storm Frisk, MD   [facility]  Cardiologist:  No primary care provider on file.  Advanced Practice Provider:  No care team member to display Electrophysiologist:  None   Referring MD: Storm Frisk, MD   History of Present Illness:    Andre Rojas is a 70 y.o. male with a hx of NSTEMI/CAD s/p CABG, bipolar disorder, COPD, DMII, peripheral vascular disease, schizophrenia, cocaine abuse, HTN, HLD, prior CVA about 10 years ago, and tobacco abuse who was referred by Dr. Delford Field for further management of his CAD.   In 2017, the patient was visiting his daughter in [place] where he developed severe chest pain. He presented to a local hospital at which time workup revealed the patient to have CAD with one vessel that was 100% blocked and the other one was 99 % blocked and that he would require immediate surgery.  He states they found out he had [facility] and he was discharged from the hospital.  He was brought back to the area by his daughter. He was seen in [place] at which time he admitted to have continued mild chest tightness with exertion.  This is associated with dyspnea.  He was referred to [facility] for evaluation for coronary bypass procedure. He was taken to the operating room and underwent CABG x 3 utilizing LIMA to LAD, SVG to PDA, SVG to Ramus Intermediate.  He also underwent endoscopic harvest of the greater saphenous vein from the right thigh.  He tolerated the procedure without difficulty and was taken to the SICU in stable condition.  He was extubated the evening of surgery.    The patient states that he has been doing well since his bypass procedure. No chest pain with exertion, mild SOB with exertion. Able to walk a mile without issues. No LE edema, orthopnea or PND. No dizziness, lightheadedness, or syncope. Has not had an echo since  his bypass procedure. TEE at time of procedure was 60%. Patient is trying to quit smoking; has cut down half. Taking all medications as prescribed. Currently living at a Shelter. Has not been taking his cholesterol medication. Last LDL 142.    Past Medical History:  Diagnosis Date  . Acid reflux   . Anemia   . Arthritis    hip, right  . Bipolar 1 disorder (HCC) 03/20/2020  . CAD (coronary artery disease)   . Chest pain   . Chronic back pain   . Cocaine abuse (HCC) 10/16/2012  . COPD (chronic obstructive pulmonary disease) (HCC)   . COPD with chronic bronchitis (HCC) 03/20/2020  . Coronary artery disease 06/26/2015  . Diabetes (HCC)   . 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 Erectile dysfunction   . Essential hypertension 03/20/2020  . Gastro-esophageal reflux disease without esophagitis 03/20/2020  . History of non-ST elevation myocardial infarction (NSTEMI) 06/26/2015   06/01/2015 in [place]   . Hyperlipidemia   . Hypertension   . Lives in homeless shelter 03/20/2020  . Pain in joint, pelvic region and thigh 03/20/2020  . Peripheral vascular disease (HCC)    right leg stays cold  . Pneumonia   . Psychosis (HCC) 10/16/2012  . S/P CABG x 3 06/29/2015  . Tobacco user 03/20/2020    Past Surgical History:  Procedure Laterality Date  . CARDIAC CATHETERIZATION  05/2015  . COLONOSCOPY    .  CORONARY ARTERY BYPASS GRAFT N/A 06/29/2015   Procedure: CORONARY ARTERY BYPASS GRAFTING (CABG) TIMES THREE USING LEFT INTERNAL MAMMARY ARTERY AND RIGHT GREATER SAPHENOUS VEIN HARVESTED BY ENDOVEIN;  Surgeon: Loreli Slot, MD;  Location: [facility] OR;  Service: Open Heart Surgery;  Laterality: N/A;  . KNEE SURGERY Left   . TEE WITHOUT CARDIOVERSION N/A 06/29/2015   Procedure: TRANSESOPHAGEAL ECHOCARDIOGRAM (TEE);  Surgeon: Loreli Slot, MD;  Location: [facility] OR;  Service: Open Heart Surgery;  Laterality: N/A;    Current Medications: Current Meds  Medication Sig  . albuterol (VENTOLIN  HFA) 108 (90 Base) MCG/ACT inhaler Inhale 2 puffs into the lungs every 6 (six) hours as needed for wheezing or shortness of breath.  Marland Kitchen aspirin 325 MG tablet Take 1 tablet (325 mg total) by mouth daily.  Marland Kitchen azithromycin (ZITHROMAX) 250 MG tablet Take two once then one daily until gone  . budesonide-formoterol (SYMBICORT) 160-4.5 MCG/ACT inhaler Inhale 2 puffs into the lungs 2 (two) times daily.  . carvedilol (COREG) 12.5 MG tablet Take 1 tablet (12.5 mg total) by mouth 2 (two) times daily with a meal.  . cyclobenzaprine (FLEXERIL) 5 MG tablet Take 1 tablet (5 mg total) by mouth 3 (three) times daily.  . fluticasone (FLONASE) 50 MCG/ACT nasal spray Place 2 sprays into both nostrils daily.  Marland Kitchen lisinopril (ZESTRIL) 10 MG tablet Take 1 tablet (10 mg total) by mouth daily.  . metFORMIN (GLUCOPHAGE) 500 MG tablet Take 1 tablet (500 mg total) by mouth 2 (two) times daily with a meal.  . pantoprazole (PROTONIX) 40 MG tablet Take 1 tablet (40 mg total) by mouth daily before breakfast.  . QUEtiapine (SEROQUEL) 50 MG tablet Take 1 tablet (50 mg total) by mouth at bedtime.  . traZODone (DESYREL) 100 MG tablet TAKE 1 TABLET BY MOUTH AT BEDTIME AS NEEDED FOR SLEEP.  . [DISCONTINUED] atorvastatin (LIPITOR) 80 MG tablet Take 1 tablet (80 mg total) by mouth daily at 6 PM.     Allergies:   Patient has no known allergies.   Social History   Socioeconomic History  . Marital status: Single    Spouse name: Not on file  . Number of children: Not on file  . Years of education: Not on file  . Highest education level: Not on file  Occupational History  . Not on file  Tobacco Use  . Smoking status: Current Every Day Smoker    Packs/day: 0.50    Years: 40.00    Pack years: 20.00    Types: Cigarettes  . Smokeless tobacco: Never Used  Vaping Use  . Vaping Use: Never used  Substance and Sexual Activity  . Alcohol use: Yes    Comment: 1/5 per day - Pt states he may only have one or 2 drinks if he drinks any  (noted 06/27/15)  . Drug use: Not Currently    Types: "Crack" cocaine, Cocaine    Comment: yesterday   . Sexual activity: Yes    Birth control/protection: Condom  Other Topics Concern  . Not on file  Social History Narrative  . Not on file   Social Determinants of Health   Financial Resource Strain: Not on file  Food Insecurity: Not on file  Transportation Needs: Not on file  Physical Activity: Not on file  Stress: Not on file  Social Connections: Not on file     Family History: The patient's family history includes Cancer in his mother; Heart disease in his father.  ROS:   Please  see the history of present illness.    Review of Systems  Constitutional: Negative for chills and fever.  HENT: Negative for congestion and hearing loss.   Eyes: Negative for blurred vision and redness.  Respiratory: Positive for shortness of breath.   Cardiovascular: Negative for chest pain, palpitations, orthopnea, claudication, leg swelling and PND.  Gastrointestinal: Negative for melena, nausea and vomiting.  Genitourinary: Negative for hematuria.  Musculoskeletal: Negative for falls.  Neurological: Negative for dizziness and loss of consciousness.  Endo/Heme/Allergies: Negative for polydipsia.  Psychiatric/Behavioral: Positive for substance abuse.    EKGs/Labs/Other Studies Reviewed:    The following studies were reviewed today: TEE Jul 14, 2015: Study Conclusions   - Left ventricle: The cavity size was normal. Wall thickness was  increased. There was concentric hypertrophy. Systolic function  was normal. The estimated ejection fraction was = 60%. Wall  motion was normal; there were no regional wall motion  abnormalities.  - Aortic valve: Normal-sized, mildly calcified annulus. Trileaflet;  normal thickness leaflets.  - Mitral valve: Valve area by continuity equation (using LVOT  flow): 3.74 cm^2.  - Left atrium: No evidence of thrombus in the atrial cavity or  appendage.   - Atrial septum: No defect or patent foramen ovale was identified.   Vascular ABI 04/20/20: Summary:  Right: Resting right ankle-brachial index indicates mild right lower  extremity arterial disease. The right toe-brachial index is normal.  Absent AT/DP waveform.   Left: Resting left ankle-brachial index is within normal range. No  evidence of significant left lower extremity arterial disease. The left  toe-brachial index is normal.  EKG:  EKG is  ordered today.  The ekg ordered today demonstrates sinus bradycardia, septal q-waves  Recent Labs: 03/20/2020: ALT 12; BUN 10; Creatinine, Ser 0.91; Hemoglobin 16.0; Platelets 249; Potassium 4.7; Sodium 141  Recent Lipid Panel    Component Value Date/Time   CHOL 206 (H) 03/20/2020 1631   TRIG 70 03/20/2020 1631   HDL 51 03/20/2020 1631   CHOLHDL 4.0 03/20/2020 1631   CHOLHDL 4.7 12/16/2017 0603   VLDL 20 12/16/2017 0603   LDLCALC 142 (H) 03/20/2020 1631     Physical Exam:    VS:  BP 114/72   Pulse (!) 59   Ht 5' 8.5" (1.74 m)   Wt 173 lb 9.6 oz (78.7 kg)   SpO2 98%   BMI 26.01 kg/m     Wt Readings from Last 3 Encounters:  04/27/20 173 lb 9.6 oz (78.7 kg)  04/20/20 168 lb (76.2 kg)  03/20/20 171 lb (77.6 kg)     GEN:  Well nourished, well developed in no acute distress HEENT: Normal NECK: No JVD; No carotid bruits CARDIAC: RRR, no murmurs, rubs, gallops RESPIRATORY:  Clear to auscultation without rales, wheezing or rhonchi  ABDOMEN: Soft, non-tender, non-distended MUSCULOSKELETAL:  No edema; No deformity  SKIN: Warm and dry NEUROLOGIC:  Alert and oriented x 3 PSYCHIATRIC:  Normal affect   ASSESSMENT:    1. Coronary artery disease involving native coronary artery of native heart without angina pectoris   2. Peripheral vascular disease (HCC)   3. Pure hypercholesterolemia   4. Primary hypertension   5. Mixed hyperlipidemia   6. Tobacco abuse   7. Type 2 diabetes mellitus with complication, without long-term  current use of insulin (HCC)   8. Cerebrovascular accident (CVA), unspecified mechanism (HCC)   9. S/P coronary artery bypass graft x 3    PLAN:    In order of problems listed above:  #Multivessel CAD  s/p 3v CABG: #History of NSTEMI: S/p CABG x 3 with LIMA to LAD, SVG to PDA, SVG to Ramus Intermedius. Doing well post-operatively with no anginal symptoms. Has not been taking his lipitor but has been compliant with all other medications. No TTE post-bypass in our system. At time of procedure, TEE showed LVEF 60%. -Check TTE to assess LVEF -Resume atorvastatin  daily -Continue ASA  (on higher dose given history of stroke) -Continue lisinopril  daily -Continue coreg 12.5mg  BID -Tobacco cessation encouraged; patient is currently trying to quit  #History of stroke: Occurred several years prior to CABG. No significant residual symptoms. -Continue ASA  daily -Resume lipitor  daily -Repeat lipid panel in 6 weeks  #Hypertension: Well controlled. -Continue lisinopril and coreg as above  #Hyperlipidemia: Elevated LDL 142 on last check. This was off statin. -Resume lipitor  -Repeat lipid panel in 6 weeks  #COPD: #Tobacco Abuse: -Continue home inhalers -Tobacco cessation encouraged; patient is actively trying to quit  #DMII: Management per primary. -Continue metformin   Medication Adjustments/Labs and Tests Ordered: Current medicines are reviewed at length with the patient today.  Concerns regarding medicines are outlined above.  Orders Placed This Encounter  Procedures  . Lipid panel  . EKG 12-Lead  . ECHOCARDIOGRAM COMPLETE   Meds ordered this encounter  Medications  . atorvastatin (LIPITOR) 80 MG tablet    Sig: Take 1 tablet (80 mg total) by mouth daily at 6 PM.    Dispense:  90 tablet    Refill:  1    Patient Instructions   Medication Instructions:  Your physician has recommended you make the following change in your medication:  1.   RESTART Atorvastatin 80 mg taking 1 daily   *If you need a refill on your cardiac medications before your next appointment, please call your pharmacy*   Lab Work: 06/01/2020:  Come to the office, anytime after 7:30 a.m, FASTING, NOTHING TO EAT OR DRINK AFTER MIDNIGHT THE NIGHT BEFORE, FOR A:  LIPID  If you have labs (blood work) drawn today and your tests are completely normal, you will receive your results only by: Marland Kitchen MyChart Message (if you have MyChart) OR . A paper copy in the mail If you have any lab test that is abnormal or we need to change your treatment, we will call you to review the results.   Testing/Procedures: Your physician has requested that you have an echocardiogram. Echocardiography is a painless test that uses sound waves to create images of your heart. It provides your doctor with information about the size and shape of your heart and how well your heart's chambers and valves are working. This procedure takes approximately one hour. There are no restrictions for this procedure.     Follow-Up: At [facility], you and your health needs are our priority.  As part of our continuing mission to provide you with exceptional heart care, we have created designated Provider Care Teams.  These Care Teams include your primary Cardiologist (physician) and Advanced Practice Providers (APPs -  Physician Assistants and Nurse Practitioners) who all work together to provide you with the care you need, when you need it.  We recommend signing up for the patient portal called "MyChart".  Sign up information is provided on this After Visit Summary.  MyChart is used to connect with patients for Virtual Visits (Telemedicine).  Patients are able to view lab/test results, encounter notes, upcoming appointments, etc.  Non-urgent messages can be sent to your provider as well.  To learn more about what you can do with MyChart, go to [email or web address].    Your next appointment:   6  month(s)  The format for your next appointment:   In Person  Provider:   Laurance Flatten, MD   Other Instructions  Echocardiogram An echocardiogram is a test that uses sound waves (ultrasound) to produce images of the heart. Images from an echocardiogram can provide important information about:  Heart size and shape.  The size and thickness and movement of your heart's walls.  Heart muscle function and strength.  Heart valve function or if you have stenosis. Stenosis is when the heart valves are too narrow.  If blood is flowing backward through the heart valves (regurgitation).  A tumor or infectious growth around the heart valves.  Areas of heart muscle that are not working well because of poor blood flow or injury from a heart attack.  Aneurysm detection. An aneurysm is a weak or damaged part of an artery wall. The wall bulges out from the normal force of blood pumping through the body. Tell a health care provider about:  Any allergies you have.  All medicines you are taking, including vitamins, herbs, eye drops, creams, and over-the-counter medicines.  Any blood disorders you have.  Any surgeries you have had.  Any medical conditions you have.  Whether you are pregnant or may be pregnant. What are the risks? Generally, this is a safe test. However, problems may occur, including an allergic reaction to dye (contrast) that may be used during the test. What happens before the test? No specific preparation is needed. You may eat and drink normally. What happens during the test?  You will take off your clothes from the waist up and put on a hospital gown.  Electrodes or electrocardiogram (ECG)patches may be placed on your chest. The electrodes or patches are then connected to a device that monitors your heart rate and rhythm.  You will lie down on a table for an ultrasound exam. A gel will be applied to your chest to help sound waves pass through your skin.  A  handheld device, called a transducer, will be pressed against your chest and moved over your heart. The transducer produces sound waves that travel to your heart and bounce back (or "echo" back) to the transducer. These sound waves will be captured in real-time and changed into images of your heart that can be viewed on a video monitor. The images will be recorded on a computer and reviewed by your health care provider.  You may be asked to change positions or hold your breath for a short time. This makes it easier to get different views or better views of your heart.  In some cases, you may receive contrast through an IV in one of your veins. This can improve the quality of the pictures from your heart. The procedure may vary among health care providers and hospitals.   What can I expect after the test? You may return to your normal, everyday life, including diet, activities, and medicines, unless your health care provider tells you not to do that. Follow these instructions at home:  It is up to you to get the results of your test. Ask your health care provider, or the department that is doing the test, when your results will be ready.  Keep all follow-up visits. This is important. Summary  An echocardiogram is a test that uses sound waves (ultrasound) to produce images of the heart.  Images  from an echocardiogram can provide important information about the size and shape of your heart, heart muscle function, heart valve function, and other possible heart problems.  You do not need to do anything to prepare before this test. You may eat and drink normally.  After the echocardiogram is completed, you may return to your normal, everyday life, unless your health care provider tells you not to do that. This information is not intended to replace advice given to you by your health care provider. Make sure you discuss any questions you have with your health care provider. Document Revised:  10/18/2019 Document Reviewed: 10/18/2019 Elsevier Patient Education  2021 Elsevier Inc.      Signed, Meriam SpragueHeather E Ozelle Brubacher, MD  04/27/2020 10:32 AM    [facility]

## 2020-04-26 ENCOUNTER — Encounter: Payer: Self-pay | Admitting: *Deleted

## 2020-04-26 ENCOUNTER — Encounter: Payer: Self-pay | Admitting: Physician Assistant

## 2020-04-26 NOTE — Congregational Nurse Program (Signed)
Pt stated early pm that he had not taken his meds and didn't really know what to take. He keeps his meds with him, retrieved from dorm by pt, made pillbox, went over each med and why he is prescribed the medication. He is missing one but will look in his belongings for the med and extras he might have. Discussed why he will need to take some meds in am and some in pm. Discussed metformin and need to take with meal. Will f/u Monday.

## 2020-04-26 NOTE — Progress Notes (Signed)
Andre Rojas states he is having problems sleeping.  He does not think the Seroquel dose is high enough.  He states he was on 300 mg nightly at one time.  He wonders if the 50 mg can be increased.  A bit early, but says it will take him hours to fall asleep.  Reminded of his cardiology appointment on February 18, transportation is arranged through [place]  His CBG is 115. Blood pressure 142/80, heart rate 72  Requested he keep the cardiology MD appointment and continue current dose of Seroquel, Dr. Delford Field to review next week.  Theodore Demark, PA-C

## 2020-04-27 ENCOUNTER — Ambulatory Visit (INDEPENDENT_AMBULATORY_CARE_PROVIDER_SITE_OTHER): Payer: Medicaid Other | Admitting: Cardiology

## 2020-04-27 ENCOUNTER — Other Ambulatory Visit: Payer: Self-pay

## 2020-04-27 ENCOUNTER — Encounter: Payer: Self-pay | Admitting: Cardiology

## 2020-04-27 VITALS — BP 114/72 | HR 59 | Ht 68.5 in | Wt 173.6 lb

## 2020-04-27 DIAGNOSIS — I739 Peripheral vascular disease, unspecified: Secondary | ICD-10-CM

## 2020-04-27 DIAGNOSIS — I10 Essential (primary) hypertension: Secondary | ICD-10-CM | POA: Diagnosis not present

## 2020-04-27 DIAGNOSIS — E118 Type 2 diabetes mellitus with unspecified complications: Secondary | ICD-10-CM

## 2020-04-27 DIAGNOSIS — I639 Cerebral infarction, unspecified: Secondary | ICD-10-CM

## 2020-04-27 DIAGNOSIS — E782 Mixed hyperlipidemia: Secondary | ICD-10-CM

## 2020-04-27 DIAGNOSIS — I2510 Atherosclerotic heart disease of native coronary artery without angina pectoris: Secondary | ICD-10-CM | POA: Diagnosis not present

## 2020-04-27 DIAGNOSIS — Z951 Presence of aortocoronary bypass graft: Secondary | ICD-10-CM

## 2020-04-27 DIAGNOSIS — E7800 Pure hypercholesterolemia, unspecified: Secondary | ICD-10-CM | POA: Diagnosis not present

## 2020-04-27 DIAGNOSIS — Z720 Tobacco use: Secondary | ICD-10-CM

## 2020-04-27 MED ORDER — ATORVASTATIN CALCIUM 80 MG PO TABS: 80.0000 mg | ORAL_TABLET | Freq: Every day | ORAL | 1 refills | Status: DC

## 2020-04-27 NOTE — Patient Instructions (Addendum)
Medication Instructions:  Your physician has recommended you make the following change in your medication:  1.  RESTART Atorvastatin 80 mg taking 1 daily   *If you need a refill on your cardiac medications before your next appointment, please call your pharmacy*   Lab Work: 06/01/2020:  Come to the office, anytime after 7:30 a.m, FASTING, NOTHING TO EAT OR DRINK AFTER MIDNIGHT THE NIGHT BEFORE, FOR A:  LIPID  If you have labs (blood work) drawn today and your tests are completely normal, you will receive your results only by: Marland Kitchen MyChart Message (if you have MyChart) OR . A paper copy in the mail If you have any lab test that is abnormal or we need to change your treatment, we will call you to review the results.   Testing/Procedures: Your physician has requested that you have an echocardiogram. Echocardiography is a painless test that uses sound waves to create images of your heart. It provides your doctor with information about the size and shape of your heart and how well your heart's chambers and valves are working. This procedure takes approximately one hour. There are no restrictions for this procedure.     Follow-Up: At [facility], you and your health needs are our priority.  As part of our continuing mission to provide you with exceptional heart care, we have created designated Provider Care Teams.  These Care Teams include your primary Cardiologist (physician) and Advanced Practice Providers (APPs -  Physician Assistants and Nurse Practitioners) who all work together to provide you with the care you need, when you need it.  We recommend signing up for the patient portal called "MyChart".  Sign up information is provided on this After Visit Summary.  MyChart is used to connect with patients for Virtual Visits (Telemedicine).  Patients are able to view lab/test results, encounter notes, upcoming appointments, etc.  Non-urgent messages can be sent to your provider as well.   To learn  more about what you can do with MyChart, go to [email or web address].    Your next appointment:   6 month(s)  The format for your next appointment:   In Person  Provider:   Laurance Flatten, MD   Other Instructions  Echocardiogram An echocardiogram is a test that uses sound waves (ultrasound) to produce images of the heart. Images from an echocardiogram can provide important information about:  Heart size and shape.  The size and thickness and movement of your heart's walls.  Heart muscle function and strength.  Heart valve function or if you have stenosis. Stenosis is when the heart valves are too narrow.  If blood is flowing backward through the heart valves (regurgitation).  A tumor or infectious growth around the heart valves.  Areas of heart muscle that are not working well because of poor blood flow or injury from a heart attack.  Aneurysm detection. An aneurysm is a weak or damaged part of an artery wall. The wall bulges out from the normal force of blood pumping through the body. Tell a health care provider about:  Any allergies you have.  All medicines you are taking, including vitamins, herbs, eye drops, creams, and over-the-counter medicines.  Any blood disorders you have.  Any surgeries you have had.  Any medical conditions you have.  Whether you are pregnant or may be pregnant. What are the risks? Generally, this is a safe test. However, problems may occur, including an allergic reaction to dye (contrast) that may be used during the test. What happens before  the test? No specific preparation is needed. You may eat and drink normally. What happens during the test?  You will take off your clothes from the waist up and put on a hospital gown.  Electrodes or electrocardiogram (ECG)patches may be placed on your chest. The electrodes or patches are then connected to a device that monitors your heart rate and rhythm.  You will lie down on a table for  an ultrasound exam. A gel will be applied to your chest to help sound waves pass through your skin.  A handheld device, called a transducer, will be pressed against your chest and moved over your heart. The transducer produces sound waves that travel to your heart and bounce back (or "echo" back) to the transducer. These sound waves will be captured in real-time and changed into images of your heart that can be viewed on a video monitor. The images will be recorded on a computer and reviewed by your health care provider.  You may be asked to change positions or hold your breath for a short time. This makes it easier to get different views or better views of your heart.  In some cases, you may receive contrast through an IV in one of your veins. This can improve the quality of the pictures from your heart. The procedure may vary among health care providers and hospitals.   What can I expect after the test? You may return to your normal, everyday life, including diet, activities, and medicines, unless your health care provider tells you not to do that. Follow these instructions at home:  It is up to you to get the results of your test. Ask your health care provider, or the department that is doing the test, when your results will be ready.  Keep all follow-up visits. This is important. Summary  An echocardiogram is a test that uses sound waves (ultrasound) to produce images of the heart.  Images from an echocardiogram can provide important information about the size and shape of your heart, heart muscle function, heart valve function, and other possible heart problems.  You do not need to do anything to prepare before this test. You may eat and drink normally.  After the echocardiogram is completed, you may return to your normal, everyday life, unless your health care provider tells you not to do that. This information is not intended to replace advice given to you by your health care provider.  Make sure you discuss any questions you have with your health care provider. Document Revised: 10/18/2019 Document Reviewed: 10/18/2019 Elsevier Patient Education  2021 [employer].

## 2020-05-03 ENCOUNTER — Telehealth: Payer: Self-pay | Admitting: Critical Care Medicine

## 2020-05-03 DIAGNOSIS — F319 Bipolar disorder, unspecified: Secondary | ICD-10-CM

## 2020-05-03 MED ORDER — QUETIAPINE FUMARATE 100 MG PO TABS: 100.0000 mg | ORAL_TABLET | Freq: Every day | ORAL | 3 refills | Status: DC

## 2020-05-03 MED ORDER — NICOTINE POLACRILEX 4 MG MT LOZG: 4.0000 mg | LOZENGE | OROMUCOSAL | 0 refills | Status: DC | PRN

## 2020-05-03 NOTE — Telephone Encounter (Signed)
-----  Message from Gaylyn Rong, RN sent at 05/03/2020  3:19 PM EST ----- Regarding: smoking Pt states dr Johney Frame told him his right hand/ foot numbness was possibly caused by smoking, states he is trying but would like something to help. Also would like his seroquel increased. Please advise hla

## 2020-05-03 NOTE — Telephone Encounter (Signed)
I sent new Rx to his pharmacy for seroquel and sent Rx for nicotine lozenges

## 2020-05-08 ENCOUNTER — Ambulatory Visit: Payer: Medicare HMO | Admitting: Critical Care Medicine

## 2020-05-08 NOTE — Progress Notes (Deleted)
Subjective:    Patient ID: Andre Rojas, male    DOB: 1950/09/24, 70 y.o.   MRN: [id number]  03/20/20: 70 y.o.M [facility] last [facility] 03/14/20 at shelter.  At 02/29/20 Shelter visit: This is a 70 year old male new to the shelter for 1 month first time I have seen the patient in the shelter clinic he comes in complaining of heartburn and reflux symptoms and heavy ethanol use he claims he did has had a couple of drinks of beer for the last few days but largely has quit.  He has previous history of bypass surgery in 2017 he is not on any medications whatsoever he is not on blood pressure medicines denies previous history of hypertension or stroke also should have history of type 2 diabetes but not on therapy  On exam blood pressure 96/60 pulse 70 saturation 96% room air blood sugar was 107 patient has few scattered rales at the bases distant breath sounds card exam was unremarkable  Impression prior history of cocaine use none for a year history of heavy ethanol use severe reflux symptoms history of coronary disease without active treatment for this  Plan is to get this patient into the office I can obtain labs on this patient full physical and then at that point can get him into cardiology I gave him samples of omeprazole 20 mg daily given 21 capsules to take for the reflux we will endeavor to get him in the [facility]  At 03/14/20 shelter visit: 70 year old male seen in the [facility] clinic he has history of type 2 diabetes question of hypertension previous coronary disease with bypass surgery he has an existing appointment January 11 in my clinic today he is requesting refills on his sleeping medicine atorvastatin Depakote and aspirin  On exam blood sugar is 114 remaining part of the exam is unremarkable impression is that of coronary disease for this with no refills on the [place] atorvastatin and aspirin  History of bipolar disorder and  schizophrenia patient will have refills on Depakote and trazodone all his medications sent to his [facility] and he has been reminded of his upcoming appoint with me on January 11  03/20/2020 The patient comes in today with need to establish for primary care previously followed at the [facility]  Patient with history of hypertension, coronary disease, chronic coolness of the right hand, bilateral lower extremity pain right worse than left worse with ambulation, previous bypass surgery, hyperlipidemia, history of schizophrenia.  Also history of COPD documented in the history along with reflux disease Note the patient has had 1 COVID-vaccine [employer] in November.  He is due a pneumonia vaccine Tdap and flu vaccine he accepts these at this visit.  On arrival blood pressure is elevated at 142/83.  On review of the chart he has been on metoprolol and lisinopril in the past but is not on blood pressure medications currently.  He has been taking over-the-counter proton pump inhibitor.  We did give him prescribed atorvastatin 80 mg daily and trazodone at bedtime to help sleep.  He has been on Seroquel in the past.  Formally was on Depakote but is now off the Depakote  He has yet to connect with a mental health provider  He has not seen cardiology in many years and has had bypass surgery.  Patient denies any current chest pain but does have a productive cough.  There is a history of  type 2 diabetes but is not on any medications at this time and on arrival his A1c is greater than 6  05/08/2020  Coronary artery disease Coronary artery disease with previous history of NSTEMI in 2017 with subsequent three-vessel bypass surgery  Will resume aspirin 325 mg daily, continue atorvastatin 80 mg daily, begin Coreg 12.5 mg twice daily for blood pressure control, begin lisinopril 10 mg daily.  Will refer to cardiology for further assessments   Essential hypertension For hypertension  we will resume beta-blocker Coreg 12.5 mg twice daily and resume lisinopril 10 mg daily  Peripheral vascular disease (HCC) Peripheral vascular disease present on ABI we will refer to vascular surgery for further assessment  Gastro-esophageal reflux disease without esophagitis Significant reflux disease we will begin Protonix 40 mg daily  Type 2 diabetes mellitus without complications (HCC) Type 2 diabetes with vascular complication A1c greater than 6  Begin metformin 500 mg twice daily and follow-up lab data we will also obtain urine for microalbumin  Bipolar 1 disorder (HCC) Bipolar disorder with disorganized schizophrenia  Plan to discontinue Depakote and begin Seroquel 50 mg at bedtime trazodone 100 mg at bedtime  We will endeavor to get this patient into mental health clinic  Cocaine abuse (HCC) Not currently using  Disorganized schizophrenia (HCC) As per bipolar assessment  Pain in joint, pelvic region and thigh Continued joint pain present will observe Begin Robaxin as needed  Tobacco user Ongoing tobacco use counseling given     Current smoking consumption amount: 1 pack a day  Dicsussion on advise to quit smoking and smoking impacts: Cardiovascular lung impacts  Patient's willingness to quit: Not yet ready  Methods to quit smoking discussed: Nicotine replacement  Medication management of smoking session drugs discussed: Nicotine replacement however not indicated due to vascular disease  Resources provided:  AVS   Setting quit date not established Follow-up arranged 1 month   Time spent counseling the patient: 5 minutes   COPD with chronic bronchitis (HCC) COPD with active bronchitis  Begin azithromycin for 5 days  Begin Symbicort and albuterol   Diagnoses and all orders for this visit:  Coronary artery disease due to calcified coronary lesion -     CBC with Differential/Platelet -     Lipid panel -     POCT ABI Screening Pilot No Charge -      Ambulatory referral to Cardiology  Type 2 diabetes mellitus with diabetic peripheral angiopathy without gangrene, without long-term current use of insulin (HCC) -     Comprehensive metabolic panel -     Microalbumin / creatinine urine ratio -     HgB A1c -     Lipid panel  Peripheral vascular disease (HCC) -     CBC with Differential/Platelet -     POCT ABI Screening Pilot No Charge -     Ambulatory referral to Vascular Surgery  Essential hypertension -     Comprehensive metabolic panel -     CBC with Differential/Platelet  Type 2 diabetes mellitus with diabetic peripheral angiopathy without gangrene, without long-term current use of insulin (HCC) -     Comprehensive metabolic panel -     Microalbumin / creatinine urine ratio -     HgB A1c -     Lipid panel  Encounter for screening for HIV -     HIV Antibody (routine testing w rflx)  Need for hepatitis C screening test -     HCV Ab w Reflex to Quant PCR  Need for immunization against influenza -     Flu Vaccine QUAD 36+ mos IM  Gastro-esophageal reflux disease without esophagitis  Type 2 diabetes mellitus without complication, without long-term current use of insulin (HCC)  Bipolar 1 disorder (HCC)  Cocaine abuse (HCC)  Disorganized schizophrenia (HCC)  Pain in joint involving right pelvic region and thigh  Tobacco user  COPD with chronic bronchitis (HCC)  Other orders -     Albuterol Sulfate (PROAIR RESPICLICK) 108 (90 Base) MCG/ACT AEPB; Inhale 2 puffs into the lungs every 6 (six) hours as needed. -     azithromycin (ZITHROMAX) 250 MG tablet; Take two once then one daily until gone -     carvedilol (COREG) 12.5 MG tablet; Take 1 tablet (12.5 mg total) by mouth 2 (two) times daily with a meal. -     pantoprazole (PROTONIX) 40 MG tablet; Take 1 tablet (40 mg total) by mouth daily before breakfast. -     QUEtiapine (SEROQUEL) 50 MG tablet; Take 1 tablet (50 mg total) by mouth at bedtime. -     aspirin 325 MG  tablet; Take 1 tablet (325 mg total) by mouth daily. -     methocarbamol (ROBAXIN) 500 MG tablet; Take 1 tablet (500 mg total) by mouth every 6 (six) hours as needed for muscle spasms. -     fluticasone (FLONASE) 50 MCG/ACT nasal spray; Place 2 sprays into both nostrils daily. -     budesonide-formoterol (SYMBICORT) 160-4.5 MCG/ACT inhaler; Inhale 2 puffs into the lungs 2 (two) times daily. -     lisinopril (ZESTRIL) 10 MG tablet; Take 1 tablet (10 mg total) by mouth daily. -     metFORMIN (GLUCOPHAGE) 500 MG tablet; Take 1 tablet (500 mg total) by mouth 2 (two) times daily with a meal. -     Pneumococcal conjugate vaccine 13-valent -     Tdap vaccine greater than or equal to 7yo IM   Patient received Pneumovax tetanus vaccine and flu vaccine     Past Medical History:  Diagnosis Date  . Acid reflux   . Anemia   . Arthritis    hip, right  . Bipolar 1 disorder (HCC) 03/20/2020  . CAD (coronary artery disease)   . Chest pain   . Chronic back pain   . Cocaine abuse (HCC) 10/16/2012  . COPD (chronic obstructive pulmonary disease) (HCC)   . COPD with chronic bronchitis (HCC) 03/20/2020  . Coronary artery disease 06/26/2015  . Diabetes (HCC)   . 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 Erectile dysfunction   . Essential hypertension 03/20/2020  . Gastro-esophageal reflux disease without esophagitis 03/20/2020  . History of non-ST elevation myocardial infarction (NSTEMI) 06/26/2015   [place]   . Hyperlipidemia   . Hypertension   . Lives in homeless shelter 03/20/2020  . Pain in joint, pelvic region and thigh 03/20/2020  . Peripheral vascular disease (HCC)    right leg stays cold  . Pneumonia   . Psychosis (HCC) 10/16/2012  . S/P CABG x 3 06/29/2015  . Tobacco user 03/20/2020     Family History  Problem Relation Age of Onset  . Cancer Mother        stomach  . Heart disease Father      Social History   Socioeconomic History  . Marital status: Single     Spouse name: Not on file  . Number of children: Not on file  . Years of education: Not on file  .  Highest education level: Not on file  Occupational History  . Not on file  Tobacco Use  . Smoking status: Current Every Day Smoker    Packs/day: 0.50    Years: 40.00    Pack years: 20.00    Types: Cigarettes  . Smokeless tobacco: Never Used  Vaping Use  . Vaping Use: Never used  Substance and Sexual Activity  . Alcohol use: Yes    Comment: 1/5 per day - Pt states he may only have one or 2 drinks if he drinks any (noted 06/27/15)  . Drug use: Not Currently    Types: "Crack" cocaine, Cocaine    Comment: yesterday   . Sexual activity: Yes    Birth control/protection: Condom  Other Topics Concern  . Not on file  Social History Narrative  . Not on file   Social Determinants of Health   Financial Resource Strain: Not on file  Food Insecurity: Not on file  Transportation Needs: Not on file  Physical Activity: Not on file  Stress: Not on file  Social Connections: Not on file  Intimate Partner Violence: Not on file     No Known Allergies   Outpatient Medications Prior to Visit  Medication Sig Dispense Refill  . albuterol (VENTOLIN HFA) 108 (90 Base) MCG/ACT inhaler Inhale 2 puffs into the lungs every 6 (six) hours as needed for wheezing or shortness of breath. 18 g 2  . aspirin 325 MG tablet Take 1 tablet (325 mg total) by mouth daily. 60 tablet 2  . atorvastatin (LIPITOR) 80 MG tablet Take 1 tablet (80 mg total) by mouth daily at 6 PM. 90 tablet 1  . azithromycin (ZITHROMAX) 250 MG tablet Take two once then one daily until gone 6 tablet 0  . budesonide-formoterol (SYMBICORT) 160-4.5 MCG/ACT inhaler Inhale 2 puffs into the lungs 2 (two) times daily. 1 each 12  . carvedilol (COREG) 12.5 MG tablet Take 1 tablet (12.5 mg total) by mouth 2 (two) times daily with a meal. 60 tablet 3  . cyclobenzaprine (FLEXERIL) 5 MG tablet Take 1 tablet (5 mg total) by mouth 3 (three) times daily. 90  tablet 1  . fluticasone (FLONASE) 50 MCG/ACT nasal spray Place 2 sprays into both nostrils daily. 16 g 6  . lisinopril (ZESTRIL) 10 MG tablet Take 1 tablet (10 mg total) by mouth daily. 90 tablet 1  . metFORMIN (GLUCOPHAGE) 500 MG tablet Take 1 tablet (500 mg total) by mouth 2 (two) times daily with a meal. 180 tablet 3  . nicotine polacrilex (NICOTINE MINI) 4 MG lozenge Take 1 lozenge (4 mg total) by mouth as needed for smoking cessation. 100 tablet 0  . pantoprazole (PROTONIX) 40 MG tablet Take 1 tablet (40 mg total) by mouth daily before breakfast. 40 tablet 3  . QUEtiapine (SEROQUEL) 100 MG tablet Take 1 tablet (100 mg total) by mouth at bedtime. 30 tablet 3  . traZODone (DESYREL) 100 MG tablet TAKE 1 TABLET BY MOUTH AT BEDTIME AS NEEDED FOR SLEEP. 90 tablet 1   No facility-administered medications prior to visit.      Review of Systems  HENT: Positive for postnasal drip.   Respiratory: Negative for cough, shortness of breath, wheezing and stridor.   Cardiovascular: Negative.        Claudication RLE  Gastrointestinal: Negative.   Genitourinary: Negative.   Musculoskeletal: Positive for back pain and gait problem.  Neurological: Positive for weakness. Negative for headaches.       Weak in both arms  Objective:   Physical Exam There were no vitals filed for this visit.  Gen: Pleasant, well-nourished, in no distress,  normal affect  ENT: No lesions,  mouth clear,  oropharynx clear, no postnasal drip  Neck: No JVD, no TMG, no carotid bruits  Lungs: No use of accessory muscles, no dullness to percussion, distant breath sounds  Cardiovascular: RRR, heart sounds normal, no murmur or gallops, no peripheral edema Decreased pulses in both right and left lower extremity  Abdomen: soft and NT, no HSM,  BS normal  Musculoskeletal: No deformities, no cyanosis or clubbing  Neuro: alert, non focal  Skin: Warm, no lesions or rashes  Screening ABI done shows peripheral artery  disease in the left lower extremity but not the right lower extremity      Assessment & Plan:  I personally reviewed all images and lab data in the [facility] system as well as any outside material available during this office visit and agree with the  radiology impressions.   No problem-specific Assessment & Plan notes found for this encounter.   There are no diagnoses linked to this encounter. Patient received Pneumovax tetanus vaccine and flu vaccine

## 2020-05-11 ENCOUNTER — Other Ambulatory Visit: Payer: Self-pay

## 2020-05-11 ENCOUNTER — Inpatient Hospital Stay (HOSPITAL_COMMUNITY)
Admission: EM | Admit: 2020-05-11 | Discharge: 2020-05-13 | DRG: 065 | Disposition: A | Discharge status: Home Health | Payer: Medicare Other | Attending: Neurology | Admitting: Neurology

## 2020-05-11 ENCOUNTER — Inpatient Hospital Stay (HOSPITAL_COMMUNITY): Payer: Medicare Other

## 2020-05-11 ENCOUNTER — Emergency Department (HOSPITAL_COMMUNITY): Payer: Medicare Other

## 2020-05-11 DIAGNOSIS — I252 Old myocardial infarction: Secondary | ICD-10-CM

## 2020-05-11 DIAGNOSIS — N529 Male erectile dysfunction, unspecified: Secondary | ICD-10-CM | POA: Diagnosis present

## 2020-05-11 DIAGNOSIS — F319 Bipolar disorder, unspecified: Secondary | ICD-10-CM | POA: Diagnosis present

## 2020-05-11 DIAGNOSIS — F201 Disorganized schizophrenia: Secondary | ICD-10-CM | POA: Diagnosis present

## 2020-05-11 DIAGNOSIS — K219 Gastro-esophageal reflux disease without esophagitis: Secondary | ICD-10-CM | POA: Diagnosis present

## 2020-05-11 DIAGNOSIS — E785 Hyperlipidemia, unspecified: Secondary | ICD-10-CM | POA: Diagnosis present

## 2020-05-11 DIAGNOSIS — R4701 Aphasia: Secondary | ICD-10-CM | POA: Diagnosis present

## 2020-05-11 DIAGNOSIS — J449 Chronic obstructive pulmonary disease, unspecified: Secondary | ICD-10-CM | POA: Diagnosis present

## 2020-05-11 DIAGNOSIS — R29704 NIHSS score 4: Secondary | ICD-10-CM | POA: Diagnosis present

## 2020-05-11 DIAGNOSIS — R29810 Facial weakness: Secondary | ICD-10-CM | POA: Diagnosis present

## 2020-05-11 DIAGNOSIS — R471 Dysarthria and anarthria: Secondary | ICD-10-CM | POA: Diagnosis present

## 2020-05-11 DIAGNOSIS — F17210 Nicotine dependence, cigarettes, uncomplicated: Secondary | ICD-10-CM | POA: Diagnosis present

## 2020-05-11 DIAGNOSIS — Z5901 Sheltered homelessness: Secondary | ICD-10-CM | POA: Diagnosis not present

## 2020-05-11 DIAGNOSIS — M549 Dorsalgia, unspecified: Secondary | ICD-10-CM | POA: Diagnosis present

## 2020-05-11 DIAGNOSIS — I10 Essential (primary) hypertension: Secondary | ICD-10-CM | POA: Diagnosis present

## 2020-05-11 DIAGNOSIS — Z91148 Patient's other noncompliance with medication regimen for other reason: Secondary | ICD-10-CM

## 2020-05-11 DIAGNOSIS — I6329 Cerebral infarction due to unspecified occlusion or stenosis of other precerebral arteries: Secondary | ICD-10-CM | POA: Diagnosis present

## 2020-05-11 DIAGNOSIS — Z79899 Other long term (current) drug therapy: Secondary | ICD-10-CM

## 2020-05-11 DIAGNOSIS — I69351 Hemiplegia and hemiparesis following cerebral infarction affecting right dominant side: Secondary | ICD-10-CM

## 2020-05-11 DIAGNOSIS — Z7982 Long term (current) use of aspirin: Secondary | ICD-10-CM

## 2020-05-11 DIAGNOSIS — Z951 Presence of aortocoronary bypass graft: Secondary | ICD-10-CM

## 2020-05-11 DIAGNOSIS — I639 Cerebral infarction, unspecified: Secondary | ICD-10-CM | POA: Diagnosis present

## 2020-05-11 DIAGNOSIS — E1151 Type 2 diabetes mellitus with diabetic peripheral angiopathy without gangrene: Secondary | ICD-10-CM | POA: Diagnosis present

## 2020-05-11 DIAGNOSIS — I2510 Atherosclerotic heart disease of native coronary artery without angina pectoris: Secondary | ICD-10-CM | POA: Diagnosis present

## 2020-05-11 DIAGNOSIS — Z8249 Family history of ischemic heart disease and other diseases of the circulatory system: Secondary | ICD-10-CM

## 2020-05-11 DIAGNOSIS — Z7951 Long term (current) use of inhaled steroids: Secondary | ICD-10-CM

## 2020-05-11 DIAGNOSIS — G8929 Other chronic pain: Secondary | ICD-10-CM | POA: Diagnosis present

## 2020-05-11 DIAGNOSIS — Z20822 Contact with and (suspected) exposure to covid-19: Secondary | ICD-10-CM | POA: Diagnosis present

## 2020-05-11 DIAGNOSIS — Z9114 Patient's other noncompliance with medication regimen: Secondary | ICD-10-CM

## 2020-05-11 DIAGNOSIS — E781 Pure hyperglyceridemia: Secondary | ICD-10-CM | POA: Diagnosis not present

## 2020-05-11 DIAGNOSIS — F1410 Cocaine abuse, uncomplicated: Secondary | ICD-10-CM | POA: Diagnosis not present

## 2020-05-11 DIAGNOSIS — Z8673 Personal history of transient ischemic attack (TIA), and cerebral infarction without residual deficits: Secondary | ICD-10-CM

## 2020-05-11 DIAGNOSIS — F17200 Nicotine dependence, unspecified, uncomplicated: Secondary | ICD-10-CM | POA: Diagnosis not present

## 2020-05-11 DIAGNOSIS — Z7984 Long term (current) use of oral hypoglycemic drugs: Secondary | ICD-10-CM

## 2020-05-11 DIAGNOSIS — I63511 Cerebral infarction due to unspecified occlusion or stenosis of right middle cerebral artery: Secondary | ICD-10-CM | POA: Diagnosis not present

## 2020-05-11 LAB — COMPREHENSIVE METABOLIC PANEL
ALT: 15 U/L (ref 0–44)
AST: 19 U/L (ref 15–41)
Albumin: 3.8 g/dL (ref 3.5–5.0)
Alkaline Phosphatase: 115 U/L (ref 38–126)
Anion gap: 10 (ref 5–15)
BUN: 7 mg/dL — ABNORMAL LOW (ref 8–23)
CO2: 27 mmol/L (ref 22–32)
Calcium: 9.4 mg/dL (ref 8.9–10.3)
Chloride: 102 mmol/L (ref 98–111)
Creatinine, Ser: 1.02 mg/dL (ref 0.61–1.24)
GFR, Estimated: >60 mL/min (ref >60)
Glucose, Bld: 124 mg/dL — ABNORMAL HIGH (ref 70–99)
Potassium: 3.6 mmol/L (ref 3.5–5.1)
Sodium: 139 mmol/L (ref 135–145)
Total Bilirubin: 2.6 mg/dL — ABNORMAL HIGH (ref 0.3–1.2)
Total Protein: 6.8 g/dL (ref 6.5–8.1)

## 2020-05-11 LAB — URINALYSIS, ROUTINE W REFLEX MICROSCOPIC
Bilirubin Urine: NEGATIVE
Glucose, UA: NEGATIVE mg/dL
Hgb urine dipstick: NEGATIVE
Ketones, ur: NEGATIVE mg/dL
Leukocytes,Ua: NEGATIVE
Nitrite: NEGATIVE
Protein, ur: NEGATIVE mg/dL
Specific Gravity, Urine: >1.046 — ABNORMAL HIGH (ref 1.005–1.030)
pH: 8 (ref 5.0–8.0)

## 2020-05-11 LAB — I-STAT CHEM 8, ED
BUN: 6 mg/dL — ABNORMAL LOW (ref 8–23)
Calcium, Ion: 1.13 mmol/L — ABNORMAL LOW (ref 1.15–1.40)
Chloride: 101 mmol/L (ref 98–111)
Creatinine, Ser: 0.9 mg/dL (ref 0.61–1.24)
Glucose, Bld: 121 mg/dL — ABNORMAL HIGH (ref 70–99)
HCT: 46 % (ref 39.0–52.0)
Hemoglobin: 15.6 g/dL (ref 13.0–17.0)
Potassium: 3.6 mmol/L (ref 3.5–5.1)
Sodium: 140 mmol/L (ref 135–145)
TCO2: 26 mmol/L (ref 22–32)

## 2020-05-11 LAB — GLUCOSE, CAPILLARY
Glucose-Capillary: 113 mg/dL — ABNORMAL HIGH (ref 70–99)
Glucose-Capillary: 114 mg/dL — ABNORMAL HIGH (ref 70–99)

## 2020-05-11 LAB — CBC
HCT: 47.2 % (ref 39.0–52.0)
Hemoglobin: 15.6 g/dL (ref 13.0–17.0)
MCH: 28.4 pg (ref 26.0–34.0)
MCHC: 33.1 g/dL (ref 30.0–36.0)
MCV: 86 fL (ref 80.0–100.0)
Platelets: 248 10*3/uL (ref 150–400)
RBC: 5.49 MIL/uL (ref 4.22–5.81)
RDW: 13.5 % (ref 11.5–15.5)
WBC: 12.9 10*3/uL — ABNORMAL HIGH (ref 4.0–10.5)
nRBC: 0 % (ref 0.0–0.2)

## 2020-05-11 LAB — DIFFERENTIAL
Abs Immature Granulocytes: 0.08 10*3/uL — ABNORMAL HIGH (ref 0.00–0.07)
Basophils Absolute: 0.1 10*3/uL (ref 0.0–0.1)
Basophils Relative: 0 %
Eosinophils Absolute: 0 10*3/uL (ref 0.0–0.5)
Eosinophils Relative: 0 %
Immature Granulocytes: 1 %
Lymphocytes Relative: 14 %
Lymphs Abs: 1.8 10*3/uL (ref 0.7–4.0)
Monocytes Absolute: 0.8 10*3/uL (ref 0.1–1.0)
Monocytes Relative: 6 %
Neutro Abs: 10.1 10*3/uL — ABNORMAL HIGH (ref 1.7–7.7)
Neutrophils Relative %: 79 %

## 2020-05-11 LAB — RAPID URINE DRUG SCREEN, HOSP PERFORMED
Amphetamines: NOT DETECTED
Barbiturates: NOT DETECTED
Benzodiazepines: NOT DETECTED
Cocaine: POSITIVE — AB
Opiates: NOT DETECTED
Tetrahydrocannabinol: NOT DETECTED

## 2020-05-11 LAB — ETHANOL: Alcohol, Ethyl (B): <10 mg/dL (ref <10)

## 2020-05-11 LAB — APTT: aPTT: 29 seconds (ref 24–36)

## 2020-05-11 LAB — RESP PANEL BY RT-PCR (FLU A&B, COVID) ARPGX2
Influenza A by PCR: NEGATIVE
Influenza B by PCR: NEGATIVE
SARS Coronavirus 2 by RT PCR: NEGATIVE

## 2020-05-11 LAB — PROTIME-INR
INR: 1.1 (ref 0.8–1.2)
Prothrombin Time: 13.9 seconds (ref 11.4–15.2)

## 2020-05-11 LAB — CBG MONITORING, ED: Glucose-Capillary: 164 mg/dL — ABNORMAL HIGH (ref 70–99)

## 2020-05-11 MED ORDER — SENNOSIDES-DOCUSATE SODIUM 8.6-50 MG PO TABS
1.0000 | ORAL_TABLET | Freq: Two times a day (BID) | ORAL | Status: DC
  Administered 2020-05-11 – 2020-05-13 (×4): 1 via ORAL
  Filled 2020-05-11 (×4): qty 1

## 2020-05-11 MED ORDER — STROKE: EARLY STAGES OF RECOVERY BOOK
Freq: Once | Status: AC
  Filled 2020-05-11: qty 1

## 2020-05-11 MED ORDER — QUETIAPINE FUMARATE 25 MG PO TABS
25.0000 mg | ORAL_TABLET | Freq: Every day | ORAL | Status: DC
  Administered 2020-05-11: 25 mg via ORAL
  Filled 2020-05-11 (×2): qty 1

## 2020-05-11 MED ORDER — ACETAMINOPHEN 650 MG RE SUPP: 650.0000 mg | RECTAL | Status: DC | PRN

## 2020-05-11 MED ORDER — ACETAMINOPHEN 325 MG PO TABS: 650.0000 mg | ORAL_TABLET | ORAL | Status: DC | PRN

## 2020-05-11 MED ORDER — SODIUM CHLORIDE 0.9 % IV SOLN: INTRAVENOUS | Status: DC

## 2020-05-11 MED ORDER — ASPIRIN EC 81 MG PO TBEC
81.0000 mg | DELAYED_RELEASE_TABLET | Freq: Every day | ORAL | Status: DC
  Administered 2020-05-11: 81 mg via ORAL
  Filled 2020-05-11: qty 1

## 2020-05-11 MED ORDER — CLOPIDOGREL BISULFATE 75 MG PO TABS
75.0000 mg | ORAL_TABLET | Freq: Every day | ORAL | Status: DC
  Administered 2020-05-12 – 2020-05-13 (×2): 75 mg via ORAL
  Filled 2020-05-11 (×2): qty 1

## 2020-05-11 MED ORDER — IOHEXOL 350 MG/ML SOLN
75.0000 mL | Freq: Once | INTRAVENOUS | Status: AC | PRN
  Administered 2020-05-11: 75 mL via INTRAVENOUS

## 2020-05-11 MED ORDER — ACETAMINOPHEN 160 MG/5ML PO SOLN: 650.0000 mg | ORAL | Status: DC | PRN

## 2020-05-11 MED ORDER — CLOPIDOGREL BISULFATE 300 MG PO TABS
300.0000 mg | ORAL_TABLET | Freq: Once | ORAL | Status: AC
  Administered 2020-05-11: 300 mg via ORAL
  Filled 2020-05-11: qty 1

## 2020-05-11 MED ORDER — CHLORHEXIDINE GLUCONATE CLOTH 2 % EX PADS
6.0000 | MEDICATED_PAD | Freq: Every day | CUTANEOUS | Status: DC
  Administered 2020-05-11: 6 via TOPICAL

## 2020-05-11 NOTE — ED Triage Notes (Signed)
Pt arrives via [facility] as a code stroke. Per EMS patient went to bed around 2300 hours last night normally. Upon waking up this morning pt noted to have L sided weakness (weak grip strength), numbness, and aphasia. VSS w/ EMS. Pt does have hx of HTN, DM, cocaine use 3 days ago, stroke 20 years ago. Upon arrival however pt presented with weakness and numbness to the R side exhibiting lower leg drift. Pt AOx4.

## 2020-05-11 NOTE — ED Notes (Signed)
Pt has urinal at bedside.

## 2020-05-11 NOTE — H&P (Signed)
Neurology H&P  CC: Code stroke  History is obtained from: patient, EMS, chart  HPI: Andre Rojas is a 70 y.o. male with a PMHx of DM II, HLD, HTN, schizophrenia, bipolar disorder, PVD, CAD, COPD, and remote stroke 20 years ago. He also has a history of Cocaine abuse and last used 3 days ago.   Andre Rojas presented as a code stroke today. States he was fine before bed but upon awakening, he had difficulty getting his words out and was weak on left side (per EMS). CBG 139 and BP 130s in the field. He did present with some aphasia at the bridge. Patient was taken emergently to CT scanner.   Upon first NIHSS, he had RLE drift, dysarthria and aphasia. He later had great improvement of his symptoms.   CTA showed M2 occlusion and since symptoms had resolved, he was not taken to IR nor given tPA due to his LKW being outside the window.   LKW: at bedtime last night tpa given?: No, outside window IR Thrombectomy? No, symptoms resolved. MRS 1  NIHSS:  1a Level of Conscious: 0                            Next NIHSS check was 0 1b LOC Questions: 0 1c LOC Commands: 0 2 Best Gaze: 0 3 Visual: 0 4 Facial Palsy: 0 5a Motor Arm - left: 0 5b Motor Arm - Right: 0 6a Motor Leg - Left: 0 6b Motor Leg - Right: 2 7 Limb Ataxia: 0 8 Sensory: 0 9 Best Language: 1 10 Dysarthria: 1 11 Extinct. and Inatten: 0  TOTAL: 4   ROS: A complete ROS was not performed due to emergent nature of event.    Past Medical History:  Diagnosis Date  . Acid reflux   . Anemia   . Arthritis    hip, right  . Bipolar 1 disorder (HCC) 03/20/2020  . CAD (coronary artery disease)   . Chest pain   . Chronic back pain   . Cocaine abuse (HCC) 10/16/2012  . COPD (chronic obstructive pulmonary disease) (HCC)   . COPD with chronic bronchitis (HCC) 03/20/2020  . Coronary artery disease 06/26/2015  . Diabetes (HCC)   . 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 Erectile dysfunction   . Essential hypertension 03/20/2020  .  Gastro-esophageal reflux disease without esophagitis 03/20/2020  . History of non-ST elevation myocardial infarction (NSTEMI) 06/26/2015   06/01/2015 in [place]   . Hyperlipidemia   . Hypertension   . Lives in homeless shelter 03/20/2020  . Pain in joint, pelvic region and thigh 03/20/2020  . Peripheral vascular disease (HCC)    right leg stays cold  . Pneumonia   . Psychosis (HCC) 10/16/2012  . S/P CABG x 3 06/29/2015  . Tobacco user 03/20/2020    Family History  Problem Relation Age of Onset  . Cancer Mother        stomach  . Heart disease Father    Social History:  reports that he has been smoking cigarettes. He has a 20.00 pack-year smoking history. He has never used smokeless tobacco. He reports current alcohol use. He reports previous drug use. Drugs: "Crack" cocaine and Cocaine.   Prior to Admission medications   Medication Sig Start Date End Date Taking? Authorizing Provider  albuterol (VENTOLIN HFA) 108 (90 Base) MCG/ACT inhaler Inhale 2 puffs into the lungs every 6 (six) hours as needed for wheezing or shortness  of breath. 03/22/20   Storm Frisk, MD  aspirin 325 MG tablet Take 1 tablet (325 mg total) by mouth daily. 03/20/20   Storm Frisk, MD  atorvastatin (LIPITOR) 80 MG tablet Take 1 tablet (80 mg total) by mouth daily at 6 PM. 04/27/20   Meriam Sprague, MD  azithromycin (ZITHROMAX) 250 MG tablet Take two once then one daily until gone 03/20/20   Storm Frisk, MD  budesonide-formoterol [facility]) 160-4.5 MCG/ACT inhaler Inhale 2 puffs into the lungs 2 (two) times daily. 03/20/20 03/20/21  Storm Frisk, MD  carvedilol (COREG) 12.5 MG tablet Take 1 tablet (12.5 mg total) by mouth 2 (two) times daily with a meal. 03/20/20   Storm Frisk, MD  cyclobenzaprine (FLEXERIL) 5 MG tablet Take 1 tablet (5 mg total) by mouth 3 (three) times daily. 03/22/20   Storm Frisk, MD  fluticasone (FLONASE) 50 MCG/ACT nasal spray Place 2 sprays into both nostrils  daily. 03/20/20   Storm Frisk, MD  lisinopril (ZESTRIL) 10 MG tablet Take 1 tablet (10 mg total) by mouth daily. 03/20/20   Storm Frisk, MD  metFORMIN (GLUCOPHAGE) 500 MG tablet Take 1 tablet (500 mg total) by mouth 2 (two) times daily with a meal. 03/20/20   Storm Frisk, MD  nicotine polacrilex (NICOTINE MINI) 4 MG lozenge Take 1 lozenge (4 mg total) by mouth as needed for smoking cessation. 05/03/20   Storm Frisk, MD  pantoprazole (PROTONIX) 40 MG tablet Take 1 tablet (40 mg total) by mouth daily before breakfast. 03/20/20   Storm Frisk, MD  QUEtiapine (SEROQUEL) 100 MG tablet Take 1 tablet (100 mg total) by mouth at bedtime. 05/03/20   Storm Frisk, MD  traZODone (DESYREL) 100 MG tablet TAKE 1 TABLET BY MOUTH AT BEDTIME AS NEEDED FOR SLEEP. 04/06/20   Storm Frisk, MD    Exam: Current vital signs: BP (!) 157/78 (BP Location: Right Arm)   Pulse 73   Temp 97.8 F (36.6 C) (Oral)   Resp (!) 32   Ht 5\' 8"  (1.727 m)   Wt 78 kg   SpO2 95%   BMI 26.15 kg/m   Physical Exam  Constitutional: Appears well-developed and well-nourished.  Psych: Affect appropriate to situation Eyes: No scleral injection HENT: No OP obstrucion. Poor dental hygiene and endentulous Head: Normocephalic.  Cardiovascular: Normal rate and regular rhythm.  Respiratory: Effort normal  GI: Soft.  No distension. There is no tenderness.  Skin: WDI  Neuro: Mental Status: Patient is awake, alert, oriented to person, place, month, year, and situation. Patient is able to give a clear and coherent history. Minor expressive aphasia upon arrival, later resolved. No neglect Speech/Language: Cranial Nerves: II: Visual Fields are full. Pupils are equal, round, and reactive to light.  III,IV, VI: EOMI without ptosis or diploplia.  V: Facial sensation is symmetric to temperature VII: Facial movement is symmetric.  VIII: hearing is intact to voice X: Uvula elevates symmetrically XI:  Shoulder shrug is symmetric. XII: tongue is midline without atrophy or fasciculations.  Motor: Tone is normal. Bulk is normal. 5/5 strength was present in all four extremities.  Sensory: Sensation is symmetric to light touch in the arms and legs.  Plantars: Toes are downgoing bilaterally.  Cerebellar: FNF intact. Drift to RLE upon arrival. Later, resolved.    I have reviewed labs in epic and the pertinent results are: INR 1.1        UDS pending  MD reviewed the images obtained:  CT perfusion head  1. The perfusion software indicates poor source data quality/bolus timing, which may affect the reliability of the perfusion summary. 2. The perfusion software identifies a 101 mL region of critically hypoperfused parenchyma, predominantly within the left MCA and ACA territories, utilizing the Tmax>6 seconds threshold. No core infarct is identified.  CTA neck 1. The common and internal carotid arteries are patent within the neck without hemodynamically significant stenosis. Mild atherosclerotic plaque within the carotid systems within the neck as described. 2. Vertebral arteries patent within the neck without significant stenosis. 3. The origin of the innominate artery is excluded from the field of view. 4. Emphysema (ICD10-J43.9).  CTA head 1. Segmental occlusion of a proximal M2 left MCA vessel. Beyond this, there is some reconstitution with subsequent occlusion of a mid M2 left MCA branch vessel. 2. The right posterior cerebral artery is occluded shortly beyond its origin. 3. Additional intracranial atherosclerotic disease with multifocal stenoses, most notably as follows. 4. Severe stenosis of the cavernous left ICA. 5. Moderate to moderately severe stenosis of the cavernous right ICA. 6. Multifocal severe stenoses within the A1 left ACA. 7. Severe stenosis within the distal A2 segment of the right ACA. 8. Moderate/severe stenosis of the dominant V4 right  vertebral artery.  Electronically signed by: Jimmye Norman, MSN, APN-BC, nurse practitioner and by MD. Note/plan to be edited by MD as needed.  Pager: [phone]  I have seen and reviewed the patient.  I was present for the entirety of the jointly performed evaluation and management reflected in the above note.   Assessment: 70 yo male who presented as a code stroke. His NIHSS was 4 which improved to 0. Decision was made not to give tPA due to being outside the window. CTA showed occlusion of a proximal M2 left MCA vessel with multiple areas of atherosclerosis. Patient was not taken to IR at this time due to resolvement of symptoms. However, he will be admitted to ICU and closely watched.  Impression:  1. CVA 2. Proximal M2 left occlusion with multiple area of stenosis in intracranial vessels.   Plan: -neurology admit to ICU - MRI brain without contrast. - TTE. - HbA1c, lipid panel, TSH. - Prescribe or increase statin if LDL over 70.  - Aspirin 81mg  daily. - Clopidogrel 300mg  load, then 75mg  daily - SBP goal - Permissive hypertension first 24 h < 220/110. Hold home medications for now. - Telemetry monitoring for arrhythmia. - Recommend bedside Swallow screen. - Recommend Stroke education. - Recommend PT/OT/SLP consult. -UDS -SCDs -SSI for DM -Continue home Seroquel  This patient is critically ill and at significant risk of neurological worsening, death and care requires constant monitoring of vital signs, hemodynamics,respiratory and cardiac monitoring, neurological assessment, discussion with family, other specialists and medical decision making of high complexity. I spent 45 minutes of neurocritical care time  in the care of  this patient. This was time spent independent of any time provided by nurse practitioner or PA.  , MD [facility] [phone]  If 7pm- 7am, please page neurology on call as listed in [facility]ION.

## 2020-05-11 NOTE — ED Notes (Signed)
Lunch Tray Ordered @ 1100. 

## 2020-05-11 NOTE — ED Notes (Signed)
Requested NS to order a tray.

## 2020-05-11 NOTE — Code Documentation (Addendum)
Pt is 70 yr old male with Htn and DM who was normal last night (2200) and was found altered this morning. He arrived at 0809 via EMS. He was alert, cooperative, with mild aphasia and rt leg weakness. He was taken to CT 3 at 0814. CTH neg for hemorrhage per Dr Amada Jupiter. IV access obtained by RRN. Pt was noted to have decreased sensation on the right side as well as the right leg weakness and mild aphasia. (NIHSS 4). CTA and CTP obtained. It was discovered that the rt sided weakness was chronic.No TPA as Pt is OOW. No NIR as not a candidate due to low NIHSS, and symptoms mild and improving. Pt roomed in ED #3 at [phone]. Handoff with [facility] RN. Pt will need q 2 hr VS and NIHSS.

## 2020-05-11 NOTE — ED Provider Notes (Signed)
[facility] EMERGENCY DEPARTMENT Provider Note   CSN: [id number] Arrival date & time: 05/11/20  [id number]     History No chief complaint on file.   Andre Rojas is a 70 y.o. male.  He has a history of coronary disease COPD peripheral vascular disease.  Woke up with aphasia and right leg weakness.  Code stroke activation.  He said he had a prior stroke 12 years ago.  Left with some right-sided facial droop.  Always feels cold on the right side of his body.  He is an active smoker denies alcohol.  Active cocaine last used 2 days ago.  No injectable drugs.  The history is provided by the patient and the EMS personnel.  Cerebrovascular Accident This is a new problem. The current episode started 6 to 12 hours ago. The problem occurs constantly. The problem has not changed since onset.Pertinent negatives include no chest pain, no abdominal pain, no headaches and no shortness of breath. Nothing aggravates the symptoms. Nothing relieves the symptoms. He has tried nothing for the symptoms. The treatment provided no relief.       Past Medical History:  Diagnosis Date  . Acid reflux   . Anemia   . Arthritis    hip, right  . Bipolar 1 disorder (HCC) 03/20/2020  . CAD (coronary artery disease)   . Chest pain   . Chronic back pain   . Cocaine abuse (HCC) 10/16/2012  . COPD (chronic obstructive pulmonary disease) (HCC)   . COPD with chronic bronchitis (HCC) 03/20/2020  . Coronary artery disease 06/26/2015  . Diabetes (HCC)   . 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 Erectile dysfunction   . Essential hypertension 03/20/2020  . Gastro-esophageal reflux disease without esophagitis 03/20/2020  . History of non-ST elevation myocardial infarction (NSTEMI) 06/26/2015   06/01/2015 in [place]   . Hyperlipidemia   . Hypertension   . Lives in homeless shelter 03/20/2020  . Pain in joint, pelvic region and thigh 03/20/2020  . Peripheral vascular disease (HCC)    right leg stays  cold  . Pneumonia   . Psychosis (HCC) 10/16/2012  . S/P CABG x 3 06/29/2015  . Tobacco user 03/20/2020    Patient Active Problem List   Diagnosis Date Noted  . Pneumonia   . Hypertension   . Erectile dysfunction   . Diabetes (HCC)   . COPD (chronic obstructive pulmonary disease) (HCC)   . Chronic back pain   . Chest pain   . CAD (coronary artery disease)   . Arthritis   . Anemia   . Acid reflux   . Pain in joint, pelvic region and thigh 03/20/2020  . Bipolar 1 disorder (HCC) 03/20/2020  . Gastro-esophageal reflux disease without esophagitis 03/20/2020  . Essential hypertension 03/20/2020  . Peripheral vascular disease (HCC) 03/20/2020  . Tobacco user 03/20/2020  . Type 2 diabetes mellitus without complications (HCC) 03/20/2020  . COPD with chronic bronchitis (HCC) 03/20/2020  . Lives in homeless shelter 03/20/2020  . 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 S/P CABG x 3 06/29/2015  . Coronary artery disease 06/26/2015  . History of non-ST elevation myocardial infarction (NSTEMI) 06/26/2015  . Hyperlipidemia 06/26/2015  . Cocaine abuse (HCC) 10/16/2012  . Psychosis (HCC) 10/16/2012    Past Surgical History:  Procedure Laterality Date  . CARDIAC CATHETERIZATION  05/2015  . COLONOSCOPY    . CORONARY ARTERY BYPASS GRAFT N/A 06/29/2015   Procedure: CORONARY ARTERY BYPASS GRAFTING (CABG) TIMES THREE USING LEFT INTERNAL  MAMMARY ARTERY AND RIGHT GREATER SAPHENOUS VEIN HARVESTED BY ENDOVEIN;  Surgeon: Loreli Slot, MD;  Location: [facility] OR;  Service: Open Heart Surgery;  Laterality: N/A;  . KNEE SURGERY Left   . TEE WITHOUT CARDIOVERSION N/A 06/29/2015   Procedure: TRANSESOPHAGEAL ECHOCARDIOGRAM (TEE);  Surgeon: Loreli Slot, MD;  Location: [facility] OR;  Service: Open Heart Surgery;  Laterality: N/A;       Family History  Problem Relation Age of Onset  . Cancer Mother        stomach  . Heart disease Father     Social History   Tobacco Use  . Smoking status:  Current Every Day Smoker    Packs/day: 0.50    Years: 40.00    Pack years: 20.00    Types: Cigarettes  . Smokeless tobacco: Never Used  Vaping Use  . Vaping Use: Never used  Substance Use Topics  . Alcohol use: Yes    Comment: 1/5 per day - Pt states he may only have one or 2 drinks if he drinks any (noted 06/27/15)  . Drug use: Not Currently    Types: "Crack" cocaine, Cocaine    Comment: yesterday     Home Medications Prior to Admission medications   Medication Sig Start Date End Date Taking? Authorizing Provider  albuterol (VENTOLIN HFA) 108 (90 Base) MCG/ACT inhaler Inhale 2 puffs into the lungs every 6 (six) hours as needed for wheezing or shortness of breath. 03/22/20   Storm Frisk, MD  aspirin 325 MG tablet Take 1 tablet (325 mg total) by mouth daily. 03/20/20   Storm Frisk, MD  atorvastatin (LIPITOR) 80 MG tablet Take 1 tablet (80 mg total) by mouth daily at 6 PM. 04/27/20   Meriam Sprague, MD  azithromycin (ZITHROMAX) 250 MG tablet Take two once then one daily until gone 03/20/20   Storm Frisk, MD  budesonide-formoterol [facility]) 160-4.5 MCG/ACT inhaler Inhale 2 puffs into the lungs 2 (two) times daily. 03/20/20 03/20/21  Storm Frisk, MD  carvedilol (COREG) 12.5 MG tablet Take 1 tablet (12.5 mg total) by mouth 2 (two) times daily with a meal. 03/20/20   Storm Frisk, MD  cyclobenzaprine (FLEXERIL) 5 MG tablet Take 1 tablet (5 mg total) by mouth 3 (three) times daily. 03/22/20   Storm Frisk, MD  fluticasone (FLONASE) 50 MCG/ACT nasal spray Place 2 sprays into both nostrils daily. 03/20/20   Storm Frisk, MD  lisinopril (ZESTRIL) 10 MG tablet Take 1 tablet (10 mg total) by mouth daily. 03/20/20   Storm Frisk, MD  metFORMIN (GLUCOPHAGE) 500 MG tablet Take 1 tablet (500 mg total) by mouth 2 (two) times daily with a meal. 03/20/20   Storm Frisk, MD  nicotine polacrilex (NICOTINE MINI) 4 MG lozenge Take 1 lozenge (4 mg total) by mouth as  needed for smoking cessation. 05/03/20   Storm Frisk, MD  pantoprazole (PROTONIX) 40 MG tablet Take 1 tablet (40 mg total) by mouth daily before breakfast. 03/20/20   Storm Frisk, MD  QUEtiapine (SEROQUEL) 100 MG tablet Take 1 tablet (100 mg total) by mouth at bedtime. 05/03/20   Storm Frisk, MD  traZODone (DESYREL) 100 MG tablet TAKE 1 TABLET BY MOUTH AT BEDTIME AS NEEDED FOR SLEEP. 04/06/20   Storm Frisk, MD    Allergies    Patient has no known allergies.  Review of Systems   Review of Systems  Constitutional: Negative for fever.  HENT: Negative  for sore throat.   Eyes: Negative for visual disturbance.  Respiratory: Negative for shortness of breath.   Cardiovascular: Negative for chest pain.  Gastrointestinal: Negative for abdominal pain.  Genitourinary: Negative for dysuria.  Musculoskeletal: Negative for neck pain.  Skin: Negative for rash.  Neurological: Positive for speech difficulty, weakness and numbness. Negative for headaches.    Physical Exam Updated Vital Signs BP (!) 150/84   Pulse 69   Temp 98.2 F (36.8 C) (Oral)   Resp 11   Ht  (1.803 m)   Wt 73.3 kg   SpO2 95%   BMI 22.54 kg/m   Physical Exam Vitals and nursing note reviewed.  Constitutional:      Appearance: Normal appearance. He is well-developed and well-nourished.  HENT:     Head: Normocephalic and atraumatic.  Eyes:     Conjunctiva/sclera: Conjunctivae normal.  Cardiovascular:     Rate and Rhythm: Normal rate and regular rhythm.     Heart sounds: No murmur heard.   Pulmonary:     Effort: Pulmonary effort is normal. No respiratory distress.     Breath sounds: Normal breath sounds.  Abdominal:     Palpations: Abdomen is soft.     Tenderness: There is no abdominal tenderness.  Musculoskeletal:        General: No deformity, signs of injury or edema. Normal range of motion.     Cervical back: Neck supple.     Right lower leg: No edema.     Left lower leg: No edema.   Skin:    General: Skin is warm and dry.     Capillary Refill: Capillary refill takes less than 2 seconds.  Neurological:     Mental Status: He is alert. Mental status is at baseline.     Comments: He is awake and alert.  Slight right facial droop.  Normal speech.  Normal upper extremity strength with no drift.  Subjective decreased sensation right hand.  Normal lower extremity strength.  Normal sensation.  Psychiatric:        Mood and Affect: Mood and affect normal.     ED Results / Procedures / Treatments   Labs (all labs ordered are listed, but only abnormal results are displayed) Labs Reviewed  CBC - Abnormal; Notable for the following components:      Result Value   WBC 12.9 (*)    All other components within normal limits  DIFFERENTIAL - Abnormal; Notable for the following components:   Neutro Abs 10.1 (*)    Abs Immature Granulocytes 0.08 (*)    All other components within normal limits  COMPREHENSIVE METABOLIC PANEL - Abnormal; Notable for the following components:   Glucose, Bld 124 (*)    BUN 7 (*)    Total Bilirubin 2.6 (*)    All other components within normal limits  RAPID URINE DRUG SCREEN, HOSP PERFORMED - Abnormal; Notable for the following components:   Cocaine POSITIVE (*)    All other components within normal limits  URINALYSIS, ROUTINE W REFLEX MICROSCOPIC - Abnormal; Notable for the following components:   Specific Gravity, Urine >1.046 (*)    All other components within normal limits  I-STAT CHEM 8, ED - Abnormal; Notable for the following components:   BUN 6 (*)    Glucose, Bld 121 (*)    Calcium, Ion 1.13 (*)    All other components within normal limits  CBG MONITORING, ED - Abnormal; Notable for the following components:   Glucose-Capillary 164 (*)  All other components within normal limits  RESP PANEL BY RT-PCR (FLU A&B, COVID) ARPGX2  MRSA PCR SCREENING  ETHANOL  PROTIME-INR  APTT  HEMOGLOBIN A1C  LIPID PANEL    EKG EKG  Interpretation  Date/Time:  Friday May 11 2020 08:43:06 EST Ventricular Rate:  67 PR Interval:    QRS Duration: 97 QT Interval:  530 QTC Calculation: 560 R Axis:   69 Text Interpretation: Sinus rhythm Probable anteroseptal infarct, recent Prolonged QT interval nonspecific t wave flattening and QT prolonged compared with prior 12/19 Confirmed by Meridee Score [phone]) on 05/11/2020 8:46:29 AM   Radiology CT CEREBRAL PERFUSION W CONTRAST  Result Date: 05/11/2020 CLINICAL DATA:  Neuro deficit, acute, stroke suspected. Left-sided weakness, aphasia. EXAM: CT ANGIOGRAPHY HEAD AND NECK CT PERFUSION BRAIN TECHNIQUE: Multidetector CT imaging of the head and neck was performed using the standard protocol during bolus administration of intravenous contrast. Multiplanar CT image reconstructions and MIPs were obtained to evaluate the vascular anatomy. Carotid stenosis measurements (when applicable) are obtained utilizing NASCET criteria, using the distal internal carotid diameter as the denominator. Multiphase CT imaging of the brain was performed following IV bolus contrast injection. Subsequent parametric perfusion maps were calculated using RAPID software. CONTRAST:  Administered contrast not known at this time. COMPARISON:  Noncontrast head CT performed earlier today 05/11/2020. FINDINGS: CTA NECK FINDINGS Aortic arch: Standard aortic branching. The origin of the innominate artery is excluded from the field of view. Atherosclerotic plaque within the proximal left subclavian artery. No visible hemodynamically significant innominate or proximal subclavian artery stenosis. Right carotid system: CCA and ICA patent within the neck without significant stenosis (50% or greater). Mild soft and calcified plaque within the carotid bifurcation and proximal ICA. Left carotid system: CCA and ICA patent within the neck without significant stenosis (50% or greater). Mild soft and calcified plaque within the distal CCA,  carotid bifurcation and proximal ICA. Vertebral arteries: Vertebral arteries patent within the neck without significant atherosclerotic stenosis. Right vertebral artery. Skeleton: No acute bony abnormality or aggressive osseous lesion. Advanced cervical spondylosis with multilevel disc space narrowing, disc bulges, central disc protrusions and uncovertebral hypertrophy. Disc space narrowing is severe at C3-C4, C4-C5, C5-C6 and C6-C7. Other neck: No neck mass or cervical lymphadenopathy. Thyroid unremarkable. Upper chest: No consolidation within the imaged lung apices. Centrilobular and paraseptal emphysema. Prior median sternotomy. Review of the MIP images confirms the above findings CTA HEAD FINDINGS Anterior circulation: The intracranial internal carotid arteries are patent. Atherosclerotic plaque within both vessels with multifocal stenosis. Most notably, there is severe stenosis of the cavernous left ICA and moderate to moderately severe stenosis of the cavernous right ICA. The M1 middle cerebral arteries are patent. Segmental occlusion of a proximal M2 left MCA branch (series 12, image 26). Beyond this, there is some reconstitution with subsequent occlusion of a mid M2 left MCA branch (series 12, image 26). No right M2 proximal branch occlusion or high-grade proximal stenosis is identified. The anterior cerebral arteries are patent. Multifocal severe stenoses within the A1 left anterior cerebral artery. Severe stenosis within the distal A2 segment of the right ACA. No intracranial aneurysm is identified. Posterior circulation: The intracranial vertebral arteries are patent. Atherosclerotic plaque within the dominant V4 right vertebral artery with up to moderate/severe stenosis (series 7, image 409). Atherosclerotic irregularity of the non dominant V4 left vertebral artery with no more than mild stenosis. The basilar artery is patent. The right posterior cerebral artery is occluded shortly beyond its origin. The  left posterior  cerebral artery is patent. Posterior communicating arteries are hypoplastic or absent bilaterally. Venous sinuses: Within the limitations of contrast timing, no convincing thrombus. Anatomic variants: As described Review of the MIP images confirms the above findings CT Brain Perfusion Findings: The perfusion software indicates poor source data quality/bolus timing, which may affect the reliability of the perfusion summary. ASPECTS: 9 CBF (<30%) Volume: 0mL Perfusion (Tmax>6.0s) volume: (predominantly within the left MCA and ACA vascular territories). Mismatch Volume: 0mL Infarction Location:None identified Emergent results were called by telephone at the time of interpretation on 05/11/2020 at 8:49 am to provider Dr. Amada Jupiter , who verbally acknowledged these results. IMPRESSION: CTA neck: 1. The common and internal carotid arteries are patent within the neck without hemodynamically significant stenosis. Mild atherosclerotic plaque within the carotid systems within the neck as described. 2. Vertebral arteries patent within the neck without significant stenosis. 3. The origin of the innominate artery is excluded from the field of view. 4. Emphysema (ICD10-J43.9). CTA head: 1. Segmental occlusion of a proximal M2 left MCA vessel. Beyond this, there is some reconstitution with subsequent occlusion of a mid M2 left MCA branch vessel. 2. The right posterior cerebral artery is occluded shortly beyond its origin. 3. Additional intracranial atherosclerotic disease with multifocal stenoses, most notably as follows. 4. Severe stenosis of the cavernous left ICA. 5. Moderate to moderately severe stenosis of the cavernous right ICA. 6. Multifocal severe stenoses within the A1 left ACA. 7. Severe stenosis within the distal A2 segment of the right ACA. 8. Moderate/severe stenosis of the dominant V4 right vertebral artery. CT perfusion head: 1. The perfusion software indicates poor source data quality/bolus  timing, which may affect the reliability of the perfusion summary. 2. The perfusion software identifies a 101 mL region of critically hypoperfused parenchyma, predominantly within the left MCA and ACA territories, utilizing the Tmax>6 seconds threshold. No core infarct is identified. Electronically Signed   By: Jackey Loge DO   On: 05/11/2020 09:24   CT HEAD CODE STROKE WO CONTRAST  Result Date: 05/11/2020 CLINICAL DATA:  Code stroke. Neuro deficit, acute, stroke suspected. Additional provided: Left-sided weakness, aphasia. EXAM: CT HEAD WITHOUT CONTRAST TECHNIQUE: Contiguous axial images were obtained from the base of the skull through the vertex without intravenous contrast. COMPARISON:  Report from head CT 03/12/2009 (images unavailable). FINDINGS: Brain: Mild cerebral and cerebellar atrophy. Chronic small-vessel infarcts within the bilateral basal ganglia, deep left frontal white matter and right thalamus. Chronic lacunar infarct within the left pons (series 2, image 10). Age-indeterminate lacunar infarcts within the posterior limb of the right internal capsule (series 2, image 14) and right pons (series 2, image 8). Background moderate ill-defined hypoattenuation within the cerebral white matter is nonspecific, but compatible with chronic small vessel ischemic disease. There is no acute intracranial hemorrhage. No demarcated cortical infarct. No extra-axial fluid collection. No evidence of intracranial mass. No midline shift. Vascular: No hyperdense vessel.  Atherosclerotic calcifications. Skull: Normal. Negative for fracture or focal lesion. Sinuses/Orbits: Visualized orbits show no acute finding. Chronic medially displaced fracture deformity of the left lamina papyracea. Trace ethmoid sinus mucosal thickening. ASPECTS [facility] Stroke Program Early CT Score) - Ganglionic level infarction (caudate, lentiform nuclei, internal capsule, insula, M1-M3 cortex): 6 (a point is subtracted for age-indeterminate  lacunar infarct within the posterior limb of right internal capsule). - Supraganglionic infarction (M4-M6 cortex): 3 Total score (0-10 with 10 being normal): 9 These results were communicated to Dr. Amada Jupiter At 8:41 amon 3/4/2022by text page via the [facility] messaging  system. IMPRESSION: No acute demarcated cortical infarction or acute intracranial hemorrhage. Age-indeterminate lacunar infarcts within the posterior limb of right internal capsule and right pons. ASPECTS is 9. Chronic small-vessel infarcts within the bilateral basal ganglia, deep left frontal white matter and right thalamus. Background mild generalized parenchymal atrophy and moderate chronic small vessel ischemic disease. Electronically Signed   By: Jackey Loge DO   On: 05/11/2020 08:42   CT ANGIO HEAD CODE STROKE  Result Date: 05/11/2020 CLINICAL DATA:  Neuro deficit, acute, stroke suspected. Left-sided weakness, aphasia. EXAM: CT ANGIOGRAPHY HEAD AND NECK CT PERFUSION BRAIN TECHNIQUE: Multidetector CT imaging of the head and neck was performed using the standard protocol during bolus administration of intravenous contrast. Multiplanar CT image reconstructions and MIPs were obtained to evaluate the vascular anatomy. Carotid stenosis measurements (when applicable) are obtained utilizing NASCET criteria, using the distal internal carotid diameter as the denominator. Multiphase CT imaging of the brain was performed following IV bolus contrast injection. Subsequent parametric perfusion maps were calculated using RAPID software. CONTRAST:  Administered contrast not known at this time. COMPARISON:  Noncontrast head CT performed earlier today 05/11/2020. FINDINGS: CTA NECK FINDINGS Aortic arch: Standard aortic branching. The origin of the innominate artery is excluded from the field of view. Atherosclerotic plaque within the proximal left subclavian artery. No visible hemodynamically significant innominate or proximal subclavian artery stenosis. Right  carotid system: CCA and ICA patent within the neck without significant stenosis (50% or greater). Mild soft and calcified plaque within the carotid bifurcation and proximal ICA. Left carotid system: CCA and ICA patent within the neck without significant stenosis (50% or greater). Mild soft and calcified plaque within the distal CCA, carotid bifurcation and proximal ICA. Vertebral arteries: Vertebral arteries patent within the neck without significant atherosclerotic stenosis. Right vertebral artery. Skeleton: No acute bony abnormality or aggressive osseous lesion. Advanced cervical spondylosis with multilevel disc space narrowing, disc bulges, central disc protrusions and uncovertebral hypertrophy. Disc space narrowing is severe at C3-C4, C4-C5, C5-C6 and C6-C7. Other neck: No neck mass or cervical lymphadenopathy. Thyroid unremarkable. Upper chest: No consolidation within the imaged lung apices. Centrilobular and paraseptal emphysema. Prior median sternotomy. Review of the MIP images confirms the above findings CTA HEAD FINDINGS Anterior circulation: The intracranial internal carotid arteries are patent. Atherosclerotic plaque within both vessels with multifocal stenosis. Most notably, there is severe stenosis of the cavernous left ICA and moderate to moderately severe stenosis of the cavernous right ICA. The M1 middle cerebral arteries are patent. Segmental occlusion of a proximal M2 left MCA branch (series 12, image 26). Beyond this, there is some reconstitution with subsequent occlusion of a mid M2 left MCA branch (series 12, image 26). No right M2 proximal branch occlusion or high-grade proximal stenosis is identified. The anterior cerebral arteries are patent. Multifocal severe stenoses within the A1 left anterior cerebral artery. Severe stenosis within the distal A2 segment of the right ACA. No intracranial aneurysm is identified. Posterior circulation: The intracranial vertebral arteries are patent.  Atherosclerotic plaque within the dominant V4 right vertebral artery with up to moderate/severe stenosis (series 7, image 409). Atherosclerotic irregularity of the non dominant V4 left vertebral artery with no more than mild stenosis. The basilar artery is patent. The right posterior cerebral artery is occluded shortly beyond its origin. The left posterior cerebral artery is patent. Posterior communicating arteries are hypoplastic or absent bilaterally. Venous sinuses: Within the limitations of contrast timing, no convincing thrombus. Anatomic variants: As described Review of the MIP images confirms  the above findings CT Brain Perfusion Findings: The perfusion software indicates poor source data quality/bolus timing, which may affect the reliability of the perfusion summary. ASPECTS: 9 CBF (<30%) Volume: 0mL Perfusion (Tmax>6.0s) volume: (predominantly within the left MCA and ACA vascular territories). Mismatch Volume: 0mL Infarction Location:None identified Emergent results were called by telephone at the time of interpretation on 05/11/2020 at 8:49 am to provider Dr. Amada Jupiter , who verbally acknowledged these results. IMPRESSION: CTA neck: 1. The common and internal carotid arteries are patent within the neck without hemodynamically significant stenosis. Mild atherosclerotic plaque within the carotid systems within the neck as described. 2. Vertebral arteries patent within the neck without significant stenosis. 3. The origin of the innominate artery is excluded from the field of view. 4. Emphysema (ICD10-J43.9). CTA head: 1. Segmental occlusion of a proximal M2 left MCA vessel. Beyond this, there is some reconstitution with subsequent occlusion of a mid M2 left MCA branch vessel. 2. The right posterior cerebral artery is occluded shortly beyond its origin. 3. Additional intracranial atherosclerotic disease with multifocal stenoses, most notably as follows. 4. Severe stenosis of the cavernous left ICA. 5.  Moderate to moderately severe stenosis of the cavernous right ICA. 6. Multifocal severe stenoses within the A1 left ACA. 7. Severe stenosis within the distal A2 segment of the right ACA. 8. Moderate/severe stenosis of the dominant V4 right vertebral artery. CT perfusion head: 1. The perfusion software indicates poor source data quality/bolus timing, which may affect the reliability of the perfusion summary. 2. The perfusion software identifies a 101 mL region of critically hypoperfused parenchyma, predominantly within the left MCA and ACA territories, utilizing the Tmax>6 seconds threshold. No core infarct is identified. Electronically Signed   By: Jackey Loge DO   On: 05/11/2020 09:24   CT ANGIO NECK CODE STROKE  Result Date: 05/11/2020 CLINICAL DATA:  Neuro deficit, acute, stroke suspected. Left-sided weakness, aphasia. EXAM: CT ANGIOGRAPHY HEAD AND NECK CT PERFUSION BRAIN TECHNIQUE: Multidetector CT imaging of the head and neck was performed using the standard protocol during bolus administration of intravenous contrast. Multiplanar CT image reconstructions and MIPs were obtained to evaluate the vascular anatomy. Carotid stenosis measurements (when applicable) are obtained utilizing NASCET criteria, using the distal internal carotid diameter as the denominator. Multiphase CT imaging of the brain was performed following IV bolus contrast injection. Subsequent parametric perfusion maps were calculated using RAPID software. CONTRAST:  Administered contrast not known at this time. COMPARISON:  Noncontrast head CT performed earlier today 05/11/2020. FINDINGS: CTA NECK FINDINGS Aortic arch: Standard aortic branching. The origin of the innominate artery is excluded from the field of view. Atherosclerotic plaque within the proximal left subclavian artery. No visible hemodynamically significant innominate or proximal subclavian artery stenosis. Right carotid system: CCA and ICA patent within the neck without  significant stenosis (50% or greater). Mild soft and calcified plaque within the carotid bifurcation and proximal ICA. Left carotid system: CCA and ICA patent within the neck without significant stenosis (50% or greater). Mild soft and calcified plaque within the distal CCA, carotid bifurcation and proximal ICA. Vertebral arteries: Vertebral arteries patent within the neck without significant atherosclerotic stenosis. Right vertebral artery. Skeleton: No acute bony abnormality or aggressive osseous lesion. Advanced cervical spondylosis with multilevel disc space narrowing, disc bulges, central disc protrusions and uncovertebral hypertrophy. Disc space narrowing is severe at C3-C4, C4-C5, C5-C6 and C6-C7. Other neck: No neck mass or cervical lymphadenopathy. Thyroid unremarkable. Upper chest: No consolidation within the imaged lung apices. Centrilobular and  paraseptal emphysema. Prior median sternotomy. Review of the MIP images confirms the above findings CTA HEAD FINDINGS Anterior circulation: The intracranial internal carotid arteries are patent. Atherosclerotic plaque within both vessels with multifocal stenosis. Most notably, there is severe stenosis of the cavernous left ICA and moderate to moderately severe stenosis of the cavernous right ICA. The M1 middle cerebral arteries are patent. Segmental occlusion of a proximal M2 left MCA branch (series 12, image 26). Beyond this, there is some reconstitution with subsequent occlusion of a mid M2 left MCA branch (series 12, image 26). No right M2 proximal branch occlusion or high-grade proximal stenosis is identified. The anterior cerebral arteries are patent. Multifocal severe stenoses within the A1 left anterior cerebral artery. Severe stenosis within the distal A2 segment of the right ACA. No intracranial aneurysm is identified. Posterior circulation: The intracranial vertebral arteries are patent. Atherosclerotic plaque within the dominant V4 right vertebral  artery with up to moderate/severe stenosis (series 7, image 409). Atherosclerotic irregularity of the non dominant V4 left vertebral artery with no more than mild stenosis. The basilar artery is patent. The right posterior cerebral artery is occluded shortly beyond its origin. The left posterior cerebral artery is patent. Posterior communicating arteries are hypoplastic or absent bilaterally. Venous sinuses: Within the limitations of contrast timing, no convincing thrombus. Anatomic variants: As described Review of the MIP images confirms the above findings CT Brain Perfusion Findings: The perfusion software indicates poor source data quality/bolus timing, which may affect the reliability of the perfusion summary. ASPECTS: 9 CBF (<30%) Volume: 49mL Perfusion (Tmax>6.0s) volume: (predominantly within the left MCA and ACA vascular territories). Mismatch Volume: 40mL Infarction Location:None identified Emergent results were called by telephone at the time of interpretation on 05/11/2020 at 8:49 am to provider Dr. Amada Jupiter , who verbally acknowledged these results. IMPRESSION: CTA neck: 1. The common and internal carotid arteries are patent within the neck without hemodynamically significant stenosis. Mild atherosclerotic plaque within the carotid systems within the neck as described. 2. Vertebral arteries patent within the neck without significant stenosis. 3. The origin of the innominate artery is excluded from the field of view. 4. Emphysema (ICD10-J43.9). CTA head: 1. Segmental occlusion of a proximal M2 left MCA vessel. Beyond this, there is some reconstitution with subsequent occlusion of a mid M2 left MCA branch vessel. 2. The right posterior cerebral artery is occluded shortly beyond its origin. 3. Additional intracranial atherosclerotic disease with multifocal stenoses, most notably as follows. 4. Severe stenosis of the cavernous left ICA. 5. Moderate to moderately severe stenosis of the cavernous right  ICA. 6. Multifocal severe stenoses within the A1 left ACA. 7. Severe stenosis within the distal A2 segment of the right ACA. 8. Moderate/severe stenosis of the dominant V4 right vertebral artery. CT perfusion head: 1. The perfusion software indicates poor source data quality/bolus timing, which may affect the reliability of the perfusion summary. 2. The perfusion software identifies a 101 mL region of critically hypoperfused parenchyma, predominantly within the left MCA and ACA territories, utilizing the Tmax>6 seconds threshold. No core infarct is identified. Electronically Signed   By: Jackey Loge DO   On: 05/11/2020 09:24    Procedures .Critical Care Performed by: Terrilee Files, MD Authorized by: Terrilee Files, MD   Critical care provider statement:    Critical care time (minutes):  45   Critical care time was exclusive of:  Separately billable procedures and treating other patients   Critical care was necessary to treat or prevent imminent  or life-threatening deterioration of the following conditions:  CNS failure or compromise   Critical care was time spent personally by me on the following activities:  Discussions with consultants, evaluation of patient's response to treatment, examination of patient, ordering and performing treatments and interventions, ordering and review of laboratory studies, ordering and review of radiographic studies, pulse oximetry, re-evaluation of patient's condition, obtaining history from patient or surrogate and review of old charts     Medications Ordered in ED Medications  0.9 %  sodium chloride infusion ( Intravenous Stopped 05/11/20 1655)  acetaminophen (TYLENOL) tablet 650 mg (has no administration in time range)    Or  acetaminophen (TYLENOL) 160 MG/5ML solution 650 mg (has no administration in time range)    Or  acetaminophen (TYLENOL) suppository 650 mg (has no administration in time range)  senna-docusate (Senokot-S) tablet 1 tablet (1 tablet  Oral Patient Refused/Not Given 05/11/20 1037)  clopidogrel (PLAVIX) tablet 75 mg (has no administration in time range)  aspirin EC tablet 81 mg (81 mg Oral Given 05/11/20 1031)  QUEtiapine (SEROQUEL) tablet 25 mg (has no administration in time range)  iohexol (OMNIPAQUE) 350 MG/ML injection 75 mL (75 mLs Intravenous Contrast Given 05/11/20 0844)   stroke: mapping our early stages of recovery book ( Does not apply Given 05/11/20 1032)  clopidogrel (PLAVIX) tablet 300 mg (300 mg Oral Given 05/11/20 1031)    ED Course  I have reviewed the triage vital signs and the nursing notes.  Pertinent labs & imaging results that were available during my care of the patient were reviewed by me and considered in my medical decision making (see chart for details).  Clinical Course as of 05/11/20 0858  Caleen Essex May 11, 2020  0932 Patient evaluated by Dr. Amada Jupiter neuro hospitalist.  He says the symptoms are improving and his right leg weakness is resolved.  Aphasia better.  Will need admission to the hospital for further work-up. [MB]  [id number] Patient is not a TPA candidate due to outside of window and improving symptoms.  Plan is to observe in ICU due to findings on CTA that may warrant interventional radiology if symptoms worsen. [MB]    Clinical Course User Index [MB] Terrilee Files, MD   MDM Rules/Calculators/A&P                         This patient complains of acute onset of difficulty with speech right leg weakness; this involves an extensive number of treatment Options and is a complaint that carries with it a high risk of complications and Morbidity. The differential includes stroke, bleed, dissection, hypoglycemia, metabolic derangement, intoxication  I ordered, reviewed and interpreted labs, which included CBC with mildly elevated white count, normal hemoglobin, chemistries fairly unremarkable, urine tox positive for cocaine I ordered imaging studies which included CT head, CT angio head and neck and I  independently    visualized and interpreted imaging which showed a possible M2 occlusion Additional history obtained from EMS Previous records obtained and reviewed in epic including recent PCP visits I consulted neuro hospitalist Dr. Amada Jupiter and discussed lab and imaging findings  Critical Interventions: Emergent evaluation and imaging of code stroke and discussion of management with neurology  After the interventions stated above, I reevaluated the patient and found patient symptoms to be improved.  He will be admitted to the ICU for close neuro checks   Final Clinical Impression(s) / ED Diagnoses Final diagnoses:  Acute ischemic stroke (HCC)  Rx / DC Orders ED Discharge Orders    None       Terrilee FilesButler, Jamin Panther C, MD 05/11/20 [phone]

## 2020-05-12 DIAGNOSIS — I10 Essential (primary) hypertension: Secondary | ICD-10-CM

## 2020-05-12 DIAGNOSIS — E781 Pure hyperglyceridemia: Secondary | ICD-10-CM

## 2020-05-12 DIAGNOSIS — F1410 Cocaine abuse, uncomplicated: Secondary | ICD-10-CM

## 2020-05-12 DIAGNOSIS — I63511 Cerebral infarction due to unspecified occlusion or stenosis of right middle cerebral artery: Secondary | ICD-10-CM

## 2020-05-12 DIAGNOSIS — F17200 Nicotine dependence, unspecified, uncomplicated: Secondary | ICD-10-CM

## 2020-05-12 LAB — LIPID PANEL
Cholesterol: 173 mg/dL (ref 0–200)
HDL: 40 mg/dL — ABNORMAL LOW (ref >40)
LDL Cholesterol: 118 mg/dL — ABNORMAL HIGH (ref 0–99)
Total CHOL/HDL Ratio: 4.3 RATIO
Triglycerides: 74 mg/dL (ref <150)
VLDL: 15 mg/dL (ref 0–40)

## 2020-05-12 LAB — CBC
HCT: 42.7 % (ref 39.0–52.0)
Hemoglobin: 14.4 g/dL (ref 13.0–17.0)
MCH: 28.9 pg (ref 26.0–34.0)
MCHC: 33.7 g/dL (ref 30.0–36.0)
MCV: 85.6 fL (ref 80.0–100.0)
Platelets: 227 10*3/uL (ref 150–400)
RBC: 4.99 MIL/uL (ref 4.22–5.81)
RDW: 13.6 % (ref 11.5–15.5)
WBC: 12.9 10*3/uL — ABNORMAL HIGH (ref 4.0–10.5)
nRBC: 0 % (ref 0.0–0.2)

## 2020-05-12 LAB — GLUCOSE, CAPILLARY
Glucose-Capillary: 108 mg/dL — ABNORMAL HIGH (ref 70–99)
Glucose-Capillary: 103 mg/dL — ABNORMAL HIGH (ref 70–99)
Glucose-Capillary: 120 mg/dL — ABNORMAL HIGH (ref 70–99)
Glucose-Capillary: 106 mg/dL — ABNORMAL HIGH (ref 70–99)
Glucose-Capillary: 106 mg/dL — ABNORMAL HIGH (ref 70–99)

## 2020-05-12 LAB — MRSA PCR SCREENING: MRSA by PCR: NEGATIVE

## 2020-05-12 LAB — HEMOGLOBIN A1C
Hgb A1c MFr Bld: 6 % — ABNORMAL HIGH (ref 4.8–5.6)
Mean Plasma Glucose: 125.5 mg/dL

## 2020-05-12 LAB — CREATININE, SERUM
Creatinine, Ser: 0.86 mg/dL (ref 0.61–1.24)
GFR, Estimated: >60 mL/min (ref >60)

## 2020-05-12 MED ORDER — NICOTINE POLACRILEX 4 MG MT LOZG
4.0000 mg | LOZENGE | OROMUCOSAL | Status: DC | PRN
  Administered 2020-05-12: 4 mg via ORAL
  Filled 2020-05-12 (×2): qty 1

## 2020-05-12 MED ORDER — TRAZODONE HCL 50 MG PO TABS: 100.0000 mg | ORAL_TABLET | Freq: Every day | ORAL | Status: DC

## 2020-05-12 MED ORDER — ALBUTEROL SULFATE HFA 108 (90 BASE) MCG/ACT IN AERS
2.0000 | INHALATION_SPRAY | Freq: Four times a day (QID) | RESPIRATORY_TRACT | Status: DC | PRN
  Administered 2020-05-12: 2 via RESPIRATORY_TRACT
  Filled 2020-05-12: qty 6.7

## 2020-05-12 MED ORDER — ASPIRIN EC 325 MG PO TBEC
325.0000 mg | DELAYED_RELEASE_TABLET | Freq: Every day | ORAL | Status: DC
Start: 2020-05-12 — End: 2020-05-13
  Administered 2020-05-12 – 2020-05-13 (×2): 325 mg via ORAL
  Filled 2020-05-12 (×2): qty 1

## 2020-05-12 MED ORDER — INSULIN ASPART 100 UNIT/ML ~~LOC~~ SOLN: 0.0000 [IU] | Freq: Three times a day (TID) | SUBCUTANEOUS | Status: DC

## 2020-05-12 MED ORDER — PANTOPRAZOLE SODIUM 40 MG PO TBEC
40.0000 mg | DELAYED_RELEASE_TABLET | Freq: Every day | ORAL | Status: DC
  Administered 2020-05-13: 40 mg via ORAL
  Filled 2020-05-12 (×2): qty 1

## 2020-05-12 MED ORDER — ALUM & MAG HYDROXIDE-SIMETH 200-200-20 MG/5ML PO SUSP
30.0000 mL | ORAL | Status: DC | PRN
  Administered 2020-05-12: 30 mL via ORAL
  Filled 2020-05-12 (×4): qty 30

## 2020-05-12 MED ORDER — QUETIAPINE FUMARATE 100 MG PO TABS: 100.0000 mg | ORAL_TABLET | Freq: Every day | ORAL | Status: DC

## 2020-05-12 MED ORDER — ENOXAPARIN SODIUM 40 MG/0.4ML ~~LOC~~ SOLN
40.0000 mg | SUBCUTANEOUS | Status: DC
  Administered 2020-05-12: 40 mg via SUBCUTANEOUS
  Filled 2020-05-12: qty 0.4

## 2020-05-12 MED ORDER — FLUTICASONE PROPIONATE 50 MCG/ACT NA SUSP
2.0000 | Freq: Every day | NASAL | Status: DC
  Administered 2020-05-12 – 2020-05-13 (×2): 2 via NASAL
  Filled 2020-05-12: qty 16

## 2020-05-12 MED ORDER — QUETIAPINE FUMARATE 50 MG PO TABS
50.0000 mg | ORAL_TABLET | Freq: Every day | ORAL | Status: DC
  Administered 2020-05-12: 50 mg via ORAL
  Filled 2020-05-12: qty 1

## 2020-05-12 MED ORDER — ATORVASTATIN CALCIUM 80 MG PO TABS
80.0000 mg | ORAL_TABLET | Freq: Every day | ORAL | Status: DC
  Administered 2020-05-12 – 2020-05-13 (×2): 80 mg via ORAL
  Filled 2020-05-12: qty 1
  Filled 2020-05-12: qty 2

## 2020-05-12 MED ORDER — MOMETASONE FURO-FORMOTEROL FUM 200-5 MCG/ACT IN AERO
2.0000 | INHALATION_SPRAY | Freq: Two times a day (BID) | RESPIRATORY_TRACT | Status: DC
  Administered 2020-05-12: 2 via RESPIRATORY_TRACT
  Filled 2020-05-12: qty 8.8

## 2020-05-12 MED ORDER — INSULIN ASPART 100 UNIT/ML ~~LOC~~ SOLN: 0.0000 [IU] | Freq: Every day | SUBCUTANEOUS | Status: DC

## 2020-05-12 NOTE — Evaluation (Signed)
Physical Therapy Evaluation/ Discharge Patient Details Name: Andre Rojas MRN: [id number] DOB: 12-26-1950 Today's Date: 05/12/2020   History of Present Illness  70 y.o. male admitted 3/4 with RLE drift, dysarthria and aphasia with later improvement and M2 occlusion. No IR or tPA. PMHx: DM II, HLD, HTN, schizophrenia, bipolar disorder, PVD, CAD, COPD, and remote stroke 20 years ago, CABG, neuropathy, Cocaine abuse  Clinical Impression  Pt very pleasant and reports he is getting an apartment on Tuesday but until then has been at the shelter. Pt with good strength bil LE with 5/5 strength, no drift and pt reports resolution of all symptoms. Pt with limited dentition and reports those are going to be extracted soon as well. Pt at baseline functional status with slightly unsteady gait and use of cane. Pt aware of baseline balance putting him at risk for falls with activities like climbing on stool and reports he is aware and avoids such activities. No further acute needs at this time with pt aware and agreeable and encouraged mobility with nursing staff.   Supine 134/80 (96) Sitting 145/88 (104) After gait 128/84 (94)    Follow Up Recommendations No PT follow up    Equipment Recommendations  None recommended by PT    Recommendations for Other Services       Precautions / Restrictions Precautions Precautions: Fall      Mobility  Bed Mobility Overal bed mobility: Modified Independent                  Transfers Overall transfer level: Modified independent                  Ambulation/Gait Ambulation/Gait assistance: Modified independent (Device/Increase time) Gait Distance (Feet): 150 Feet Assistive device: Straight cane Gait Pattern/deviations: Step-through pattern;Decreased stride length   Gait velocity interpretation: >2.62 ft/sec, indicative of community ambulatory General Gait Details: pt with slightly unsteady gait with use of cane and able to way find to room  while dual task involved. pt reports baseline unsteadiness at present and no change  Stairs            Wheelchair Mobility    Modified Rankin (Stroke Patients Only) Modified Rankin (Stroke Patients Only) Pre-Morbid Rankin Score: Moderate disability Modified Rankin: Moderate disability     Balance Overall balance assessment: Needs assistance   Sitting balance-Leahy Scale: Good Sitting balance - Comments: EOB without assist     Standing balance-Leahy Scale: Good Standing balance comment: cane with gait but not consistently utilizing                             Pertinent Vitals/Pain Pain Assessment: No/denies pain    Home Living Family/patient expects to be discharged to:: Private residence Living Arrangements: Alone   Type of Home: Apartment Home Access: Stairs to enter   Secretary/administrator of Steps: flight Home Layout: One level Home Equipment: Cane - single point Additional Comments: homeless but states he has an apartment to move into next week    Prior Function Level of Independence: Independent with assistive device(s)         Comments: walks with a cane     Hand Dominance        Extremity/Trunk Assessment   Upper Extremity Assessment Upper Extremity Assessment: Defer to OT evaluation    Lower Extremity Assessment Lower Extremity Assessment: Overall WFL for tasks assessed    Cervical / Trunk Assessment Cervical / Trunk Assessment: Normal  Communication   Communication: No difficulties  Cognition Arousal/Alertness: Awake/alert Behavior During Therapy: WFL for tasks assessed/performed Overall Cognitive Status: Within Functional Limits for tasks assessed                                        General Comments      Exercises     Assessment/Plan    PT Assessment Patent does not need any further PT services  PT Problem List         PT Treatment Interventions      PT Goals (Current goals can be  found in the Care Plan section)  Acute Rehab PT Goals PT Goal Formulation: All assessment and education complete, DC therapy    Frequency     Barriers to discharge        Co-evaluation               AM-PAC PT "6 Clicks" Mobility  Outcome Measure Help needed turning from your back to your side while in a flat bed without using bedrails?: None Help needed moving from lying on your back to sitting on the side of a flat bed without using bedrails?: None Help needed moving to and from a bed to a chair (including a wheelchair)?: None Help needed standing up from a chair using your arms (e.g., wheelchair or bedside chair)?: None Help needed to walk in hospital room?: None Help needed climbing 3-5 steps with a railing? : None 6 Click Score: 24    End of Session Equipment Utilized During Treatment: Gait belt Activity Tolerance: Patient tolerated treatment well Patient left: in chair;with call bell/phone within reach;with chair alarm set Nurse Communication: Mobility status PT Visit Diagnosis: Other abnormalities of gait and mobility (R26.89)    Time: 8756-4332 PT Time Calculation (min) (ACUTE ONLY): 33 min   Charges:   PT Evaluation $PT Eval Moderate Complexity: 1 Mod          Sheryn Aldaz P, PT Acute Rehabilitation Services Pager: [phone] Office: [phone]   Kenzo Ozment B Everson Mott 05/12/2020, 12:01 PM

## 2020-05-12 NOTE — Progress Notes (Addendum)
STROKE TEAM PROGRESS NOTE   INTERVAL HISTORY His RN is at the bedside. He has had stable BPs overnight with no return of symptoms. I have d/w RN and rehab team to carefully evaluate with orthostatic BP and neuro exam with first attempt out of bed today. After some d/w pt, he has not been compliant with his home meds, but has been using cocaine.   Vitals:   05/12/20 0700 05/12/20 0800 05/12/20 0900 05/12/20 1157  BP: (!) 150/86 (!) 143/91 (!) 142/86 128/84  Pulse: 66 69 68   Resp: 12 (!) 21    Temp: 98.4 F (36.9 C)     TempSrc: Oral     SpO2: 97% 92%    Weight:      Height:       CBC:  Recent Labs  Lab 05/11/20 0826 05/11/20 0830  WBC 12.9*  --   NEUTROABS 10.1*  --   HGB 15.6 15.6  HCT 47.2 46.0  MCV 86.0  --   PLT 248  --    Basic Metabolic Panel:  Recent Labs  Lab 05/11/20 0826 05/11/20 0830  NA 139 140  K 3.6 3.6  CL 102 101  CO2 27  --   GLUCOSE 124* 121*  BUN 7* 6*  CREATININE 1.02 0.90  CALCIUM 9.4  --    Lipid Panel:  Recent Labs  Lab 05/12/20 0240  CHOL 173  TRIG 74  HDL 40*  CHOLHDL 4.3  VLDL 15  LDLCALC 703*   HgbA1c:  Recent Labs  Lab 05/12/20 0240  HGBA1C 6.0*   Urine Drug Screen:  Recent Labs  Lab 05/11/20 1308  LABOPIA NONE DETECTED  COCAINSCRNUR POSITIVE*  LABBENZ NONE DETECTED  AMPHETMU NONE DETECTED  THCU NONE DETECTED  LABBARB NONE DETECTED    Alcohol Level  Recent Labs  Lab 05/11/20 0826  ETH <10    IMAGING past 24 hours MR BRAIN WO CONTRAST  Result Date: 05/12/2020 CLINICAL DATA:  Follow-up examination for acute stroke. EXAM: MRI HEAD WITHOUT CONTRAST TECHNIQUE: Multiplanar, multiecho pulse sequences of the brain and surrounding structures were obtained without intravenous contrast. COMPARISON:  Prior CTs from earlier the same day. FINDINGS: Brain: Examination moderately degraded by motion artifact. Generalized age-related cerebral atrophy. Patchy and confluent T2/FLAIR hyperintensity within the periventricular deep  white matter both cerebral hemispheres most consistent with chronic small vessel ischemic disease, moderate in nature. Multiple scattered remote lacunar infarcts noted about the corpus callosum, bilateral basal ganglia, thalami, and pons. Few scattered tiny remote bilateral cerebellar infarcts. Three subcentimeter foci of restricted diffusion seen involving the posterior periventricular white matter of the left corona radiata, consistent with small acute ischemic infarcts (series 2, images 34, 33, 31). No associated hemorrhage or mass effect. No other evidence for acute or subacute ischemia. Gray-white matter differentiation otherwise maintained. No encephalomalacia to suggest chronic cortical infarction. No other evidence for acute or chronic intracranial hemorrhage. No mass lesion, midline shift or mass effect. No hydrocephalus or extra-axial fluid collection. Pituitary gland and suprasellar region within normal limits. Vascular: Major intracranial vascular flow voids are maintained. Skull and upper cervical spine: Craniocervical junction within normal limits. Bone marrow signal intensity grossly normal on this motion degraded exam. No scalp soft tissue abnormality. Sinuses/Orbits: Globes and orbital soft tissues demonstrate no acute finding. Paranasal sinuses are largely clear. No significant mastoid effusion. Other: 1.2 cm well-circumscribed cystic lesion noted within the subcutaneous fat of the left pre maxillary soft tissues, nonspecific, but could reflect a small sebaceous cyst (  series 5, image 7). IMPRESSION: 1. Three subcentimeter acute ischemic nonhemorrhagic infarcts involving the posterior periventricular white matter of the left corona radiata. 2. Underlying age-related cerebral atrophy with moderate chronic microvascular ischemic disease, with multiple scattered remote lacunar infarcts involving the corpus callosum, bilateral basal ganglia, thalami, and pons. Electronically Signed   By: Rise Mu M.D.   On: 05/12/2020 00:12    PHYSICAL EXAM Constitutional: Appears well-developed and well-nourished.  Psych: Affect appropriate to situation Eyes: No scleral injection HENT: No OP obstrucion. Poor dental hygiene and endentulous Head: Normocephalic.  Cardiovascular: Normal rate and regular rhythm.  Respiratory: Effort normal  GI: Soft.  No distension. There is no tenderness.  Skin: WDI  Neuro: Mental Status: Patient is awake, alert, oriented to person, place, month, year, and situation. Patient is able to give a clear and coherent history. No aphasia or anomia. No neglect. Speech/Language: Cranial Nerves: II: Visual Fields are full. Pupils are equal, round, and reactive to light.  III,IV, VI: EOMI without ptosis or diploplia.  V: Facial sensation is symmetric to temperature VII: Facial movement is symmetric.  VIII: hearing is intact to voice X: Uvula elevates symmetrically XI: Shoulder shrug is symmetric. XII: tongue is midline without atrophy or fasciculations.  Motor: Tone is normal. Bulk is normal. 5/5 strength was present in all four extremities.  Sensory: Sensation is symmetric to light touch in the arms and legs.  Plantars: Toes are downgoing bilaterally.  Cerebellar: FNF intact. Drift to RLE upon arrival. Later, resolved.    ASSESSMENT/PLAN [name] is a 70 y.o. male with history of cocaine abuse (UDS + this admit) presenting as code stroke on 3/4 with NIHSS4. He was outside time window for tPA. CTA showed prox M2 occlusion with multiple LMCA vessel atherosclerosis. NIR intervention was considered, but since his symptoms resolved, no intervention done. He was admitted to ICU for close 24h monitoring and plan call NIR if any return of stroke symptoms. .   Stroke: scattered left CR infarcts, secondary to large vessel disease with left M2 occlusion   CT head: ASPECTS 9. R PLIC and pontine old strokes. Atrophy.  CTA head and neck left M2  occlusion, right PCA occlusion bilateral ICA siphon moderate to severe stenosis  MRI  Scattered infarcts in Left CR  2D Echo pending  LDL 118  HgbA1c 6.0  UDS positive for cocaine  VTE prophylaxis - SCDs  ASA 325mg  prior to admission, now on aspirin 325 and Plavix 75 DAPT for 3 months and then Plavix alone given intracranial large vessel occlusion.  Therapy recommendations:  none  Disposition:  pending  Hypertension  Home meds: None  Stable . Permissive hypertension (OK if < 220/120) but gradually normalize in 5-7 days . Long-term BP goal 130-150 given large vessel occlusion  Hyperlipidemia  Home meds: Lipitor 80mg  resumed in hospital  LDL 118, goal < 70  High intensity statin   Continue statin at discharge  Diabetes type II Controlled  Home meds: metfomal  HgbA1c 6.0, goal < 7.0  CBGs  SSI  Close PCP follow-up  Cocaine abuse  UDS positive for cocaine  Cessation education provided  Patient is willing to quit  Tobacco abuse  Current smoker  Smoking cessation counseling provided  Pt is willing to quit  Other Stroke Risk Factors  Advanced Age >/= 65   ETOH use, alcohol level <10, advised to drink no more than 1 drink(s) a day  Hx stroke/TIA on imaging and 20 years ago per  chart  Coronary artery disease status post CABG  PVD  Other Active Problems  Medication non-compliance- counseled on this  Schizophrenia  Bipolar disorder  Leukocytosis, WBC 12.9  Hospital day # 1  Desiree Metzger-Cihelka, ARNP-C, ANVP-BC Pager: [phone]  ATTENDING NOTE: I reviewed above note and agree with the assessment and plan. Pt was seen and examined.   70 year old male with history of hypertension, hyperlipidemia, DM, PVD, CAD status post CABG, smoker, stroke 20 years ago, cocaine use, bipolar and schizophrenia admitted for episode of difficulty getting words out and left-sided weakness.  Currently symptoms resolved.  CT no acute finding but  chronic right internal capsule and pontine infarct.  CT head and neck showed left M2 occlusion, right PCA occlusion, bilateral siphon stenosis.  MRI showed left CR scattered punctate infarct.  2D echo pending.  A1c 6.0, LDL 118.  UDS positive for cocaine creatinine 0.9, WBC 12.9.  Currently neurological examination intact but patient still complaining of subjective right arm numbness but no sensation deficit on exam.  Etiology for patient stroke likely due to large vessel disease with left M2 occlusion.  Patient also has other intracranial vascular occlusions/stenosis.  Risk factors including hypertension, hyperlipidemia, PVD, diabetes, smoking, cocaine use.  PT/OT no recommendation.  Recommend aspirin 325 and Plavix 75 DAPT for 3 months and then Plavix alone.  Continue Lipitor 80.  BP goal 130-150 given intracranial stenosis.  Smoking and cocaine cessation education provided.  Stroke risk factor modification.  For detailed assessment and plan, please refer to above as I have made changes wherever appropriate.   Marvel Plan, MD PhD Stroke Neurology 05/12/2020 8:13 PM  This patient is critically ill due to left MCA stroke due to left MCA occlusion and at significant risk of neurological worsening, death form worsening neuro symptoms due to left M2 occlusion, hemorrhagic conversion, seizure. This patient's care requires constant monitoring of vital signs, hemodynamics, respiratory and cardiac monitoring, review of multiple databases, neurological assessment, discussion with family, other specialists and medical decision making of high complexity. I spent 35 minutes of neurocritical care time in the care of this patient.     To contact Stroke Continuity provider, please refer to [email or web address]. After hours, contact General Neurology

## 2020-05-12 NOTE — Progress Notes (Signed)
Writer paged neurology on call regarding MRI results.

## 2020-05-12 NOTE — Progress Notes (Signed)
PT Cancellation Note  Patient Details Name: Andre Rojas MRN: [id number] DOB: [birth year]   Cancelled Treatment:    Reason Eval/Treat Not Completed: Active bedrest order   [name] [name] 05/12/2020, 7:28 AM  Merryl Hacker, PT Acute Rehabilitation Services Pager: [phone] Office: [phone]

## 2020-05-12 NOTE — Evaluation (Signed)
Occupational Therapy Evaluation Patient Details Name: Andre Rojas MRN: [id number] DOB: October 04, 1950 Today's Date: 05/12/2020    History of Present Illness 70 y.o. male admitted 3/4 with RLE drift, dysarthria and aphasia with later improvement and M2 occlusion. No IR or tPA. PMHx: DM II, HLD, HTN, schizophrenia, bipolar disorder, PVD, CAD, COPD, and remote stroke 20 years ago, CABG, neuropathy, Cocaine abuse   Clinical Impression   Pt admitted with above. He demonstrates the below listed deficits and will benefit from continued OT to maximize safety and independence with BADLs.  Pt presents to OT with mild balance deficits which are likely baseline - he utilizes a SPC at baseline.  He currently requires supervision - mod I for ADLs.  He reports he has been staying at a shelter, and has an apartment available on Tues. Of next week where he will permanently move.  Recommend tub transfer bench for home use to reduce risk of falls while showering.        Follow Up Recommendations  No OT follow up    Equipment Recommendations  Tub/shower bench    Recommendations for Other Services       Precautions / Restrictions Precautions Precautions: Fall      Mobility Bed Mobility Overal bed mobility: Modified Independent                  Transfers Overall transfer level: Modified independent                    Balance Overall balance assessment: Needs assistance   Sitting balance-Leahy Scale: Good Sitting balance - Comments: EOB without assist     Standing balance-Leahy Scale: Good Standing balance comment: cane with gait but not consistently utilizing                           ADL either performed or assessed with clinical judgement   ADL Overall ADL's : Needs assistance/impaired Eating/Feeding: Independent   Grooming: Wash/dry hands;Wash/dry face;Oral care;Brushing hair;Supervision/safety;Standing   Upper Body Bathing: Set up;Sitting   Lower Body  Bathing: Supervison/ safety;Sit to/from stand   Upper Body Dressing : Set up;Sitting   Lower Body Dressing: Supervision/safety;Sit to/from stand   Toilet Transfer: Supervision/safety;Ambulation;Comfort height toilet (SPC)   Toileting- Clothing Manipulation and Hygiene: Supervision/safety;Sit to/from Nurse, children's Details (indicate cue type and reason): discussed possibility of tub seat and pt is receptive         Vision Baseline Vision/History: Wears glasses Wears Glasses: Reading only Patient Visual Report: No change from baseline Vision Assessment?: No apparent visual deficits     Perception Perception Perception Tested?: Yes   Praxis Praxis Praxis tested?: Within functional limits    Pertinent Vitals/Pain Pain Assessment: No/denies pain     Hand Dominance Right   Extremity/Trunk Assessment Upper Extremity Assessment Upper Extremity Assessment: Overall WFL for tasks assessed   Lower Extremity Assessment Lower Extremity Assessment: Overall WFL for tasks assessed   Cervical / Trunk Assessment Cervical / Trunk Assessment: Normal   Communication Communication Communication: No difficulties   Cognition Arousal/Alertness: Awake/alert Behavior During Therapy: WFL for tasks assessed/performed Overall Cognitive Status: Within Functional Limits for tasks assessed                                 General Comments: Pt able to perform simple path finding.  He scored 4/28 on the  Short Blessed Test which is [facility]   General Comments  VSS    Exercises     Shoulder Instructions      Home Living Family/patient expects to be discharged to:: Private residence Living Arrangements: Alone Available Help at Discharge: Family Type of Home: Apartment Home Access: Stairs to enter Secretary/administrator of Steps: flight   Home Layout: One level     Bathroom Shower/Tub: Chief Strategy Officer: Standard     Home Equipment: Gilmer Mor -  single point   Additional Comments: homeless but states he has an apartment to move into next week 05/15/2020      Prior Functioning/Environment Level of Independence: Independent with assistive device(s)        Comments: walks with a cane        OT Problem List:        OT Treatment/Interventions: Self-care/ADL training;DME and/or AE instruction;Patient/family education    OT Goals(Current goals can be found in the care plan section) Acute Rehab OT Goals Patient Stated Goal: did not state OT Goal Formulation: All assessment and education complete, DC therapy Potential to Achieve Goals: Good ADL Goals Pt Will Perform Tub/Shower Transfer: with modified independence;ambulating;tub bench  OT Frequency: Min 2X/week   Barriers to D/C:            Co-evaluation PT/OT/SLP Co-Evaluation/Treatment: Yes Reason for Co-Treatment: For patient/therapist safety   OT goals addressed during session: ADL's and self-care      AM-PAC OT "6 Clicks" Daily Activity     Outcome Measure Help from another person eating meals?: None Help from another person taking care of personal grooming?: A Little Help from another person toileting, which includes using toliet, bedpan, or urinal?: A Little Help from another person bathing (including washing, rinsing, drying)?: A Little Help from another person to put on and taking off regular upper body clothing?: A Little Help from another person to put on and taking off regular lower body clothing?: A Little 6 Click Score: 19   End of Session Equipment Utilized During Treatment: Other (comment) [facility]) Nurse Communication: Mobility status  Activity Tolerance: Patient tolerated treatment well Patient left: in chair;with call bell/phone within reach;with chair alarm set  OT Visit Diagnosis: Unsteadiness on feet (R26.81)                Time: 8546-2703 OT Time Calculation (min): 33 min Charges:  OT General Charges $OT Visit: 1 Visit OT Evaluation $OT  Eval Moderate Complexity: 1 Mod  Andre Kope C., OTR/L Acute Rehabilitation Services Pager [phone] Office [phone]   Andre Rojas 05/12/2020, 2:17 PM

## 2020-05-12 NOTE — Progress Notes (Signed)
OT Cancellation Note  Patient Details Name: Andre Rojas MRN: [id number] DOB: 29-Dec-1950   Cancelled Treatment:    Reason Eval/Treat Not Completed: Active bedrest order.  Will reattempt once cleared for OOB.  Eber Jones., OTR/L Acute Rehabilitation Services Pager [phone] Office [phone]   Jeani Hawking M 05/12/2020, 7:20 AM

## 2020-05-12 NOTE — Progress Notes (Signed)
Pt admitted to the unit as a transfer from 73M. Pt alert and verbally responsive, VSS, telemetry applied and verified with CCMD, NT called to second verify. Skin clean, dry and intact with no pressure ulcer or opened wounds noted. Reported off to oncoming RN. Dionne Bucy RN   05/12/20 1821  Vitals  Temp 97.9 F (36.6 C)  Temp Source Oral  BP (!) 159/91  MAP (mmHg) 111  BP Location Left Arm  BP Method Automatic  Patient Position (if appropriate) Sitting  Pulse Rate 69  Pulse Rate Source Monitor  Resp 18  Level of Consciousness  Level of Consciousness Alert  MEWS COLOR  MEWS Score Color Green  Oxygen Therapy  SpO2 98 %  O2 Device Room Air  Pain Assessment  Pain Scale 0-10  Pain Score 0  MEWS Score  MEWS Temp 0  MEWS Systolic 0  MEWS Pulse 0  MEWS RR 0  MEWS LOC 0  MEWS Score 0

## 2020-05-13 DIAGNOSIS — Z9114 Patient's other noncompliance with medication regimen: Secondary | ICD-10-CM

## 2020-05-13 DIAGNOSIS — I10 Essential (primary) hypertension: Secondary | ICD-10-CM

## 2020-05-13 DIAGNOSIS — I639 Cerebral infarction, unspecified: Secondary | ICD-10-CM

## 2020-05-13 DIAGNOSIS — F1410 Cocaine abuse, uncomplicated: Secondary | ICD-10-CM

## 2020-05-13 LAB — BASIC METABOLIC PANEL
Anion gap: 10 (ref 5–15)
BUN: 7 mg/dL — ABNORMAL LOW (ref 8–23)
CO2: 22 mmol/L (ref 22–32)
Calcium: 9 mg/dL (ref 8.9–10.3)
Chloride: 104 mmol/L (ref 98–111)
Creatinine, Ser: 0.84 mg/dL (ref 0.61–1.24)
GFR, Estimated: >60 mL/min (ref >60)
Glucose, Bld: 98 mg/dL (ref 70–99)
Potassium: 3.7 mmol/L (ref 3.5–5.1)
Sodium: 136 mmol/L (ref 135–145)

## 2020-05-13 LAB — CBC
HCT: 40.5 % (ref 39.0–52.0)
Hemoglobin: 14.2 g/dL (ref 13.0–17.0)
MCH: 29 pg (ref 26.0–34.0)
MCHC: 35.1 g/dL (ref 30.0–36.0)
MCV: 82.8 fL (ref 80.0–100.0)
Platelets: 223 10*3/uL (ref 150–400)
RBC: 4.89 MIL/uL (ref 4.22–5.81)
RDW: 13.5 % (ref 11.5–15.5)
WBC: 12.4 10*3/uL — ABNORMAL HIGH (ref 4.0–10.5)
nRBC: 0 % (ref 0.0–0.2)

## 2020-05-13 LAB — GLUCOSE, CAPILLARY
Glucose-Capillary: 112 mg/dL — ABNORMAL HIGH (ref 70–99)
Glucose-Capillary: 151 mg/dL — ABNORMAL HIGH (ref 70–99)
Glucose-Capillary: 128 mg/dL — ABNORMAL HIGH (ref 70–99)

## 2020-05-13 MED ORDER — CARVEDILOL 12.5 MG PO TABS: 12.5000 mg | ORAL_TABLET | Freq: Two times a day (BID) | ORAL | 3 refills | Status: DC

## 2020-05-13 MED ORDER — LISINOPRIL 10 MG PO TABS: 10.0000 mg | ORAL_TABLET | Freq: Every day | ORAL | 3 refills | Status: DC

## 2020-05-13 MED ORDER — CLOPIDOGREL BISULFATE 75 MG PO TABS: 75.0000 mg | ORAL_TABLET | Freq: Every day | ORAL | 3 refills | Status: DC

## 2020-05-13 MED ORDER — ASPIRIN 325 MG PO TBEC: 325.0000 mg | DELAYED_RELEASE_TABLET | Freq: Every day | ORAL | 0 refills | Status: AC

## 2020-05-13 MED ORDER — ATORVASTATIN CALCIUM 80 MG PO TABS: 80.0000 mg | ORAL_TABLET | Freq: Every day | ORAL | 1 refills | Status: DC

## 2020-05-13 NOTE — Progress Notes (Signed)
Pt discharge education and instructions completed with pt. Pt voices understanding and denies any questions. Pt IV and telemetry remove. Pt discharge home and pt transported off unit via wheelchair with belongings to the side. Pt handed his printed prescriptions and pt to go home via bus. Dionne Bucy RN

## 2020-05-13 NOTE — Evaluation (Signed)
Speech Language Pathology Evaluation Patient Details Name: Andre Rojas MRN: [id number] DOB: 06-01-1950 Today's Date: 05/13/2020 Time: 9381-0175 SLP Time Calculation (min) (ACUTE ONLY): 12 min  Problem List:  Patient Active Problem List   Diagnosis Date Noted  . Stroke (HCC) 05/11/2020  . Pneumonia   . Hypertension   . Erectile dysfunction   . Diabetes (HCC)   . COPD (chronic obstructive pulmonary disease) (HCC)   . Chronic back pain   . Chest pain   . CAD (coronary artery disease)   . Arthritis   . Anemia   . Acid reflux   . Pain in joint, pelvic region and thigh 03/20/2020  . Bipolar 1 disorder (HCC) 03/20/2020  . Gastro-esophageal reflux disease without esophagitis 03/20/2020  . Essential hypertension 03/20/2020  . Peripheral vascular disease (HCC) 03/20/2020  . Tobacco user 03/20/2020  . Type 2 diabetes mellitus without complications (HCC) 03/20/2020  . COPD with chronic bronchitis (HCC) 03/20/2020  . Lives in homeless shelter 03/20/2020  . 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 S/P CABG x 3 06/29/2015  . Coronary artery disease 06/26/2015  . History of non-ST elevation myocardial infarction (NSTEMI) 06/26/2015  . Hyperlipidemia 06/26/2015  . Cocaine abuse (HCC) 10/16/2012  . Psychosis (HCC) 10/16/2012   Past Medical History:  Past Medical History:  Diagnosis Date  . Acid reflux   . Anemia   . Arthritis    hip, right  . Bipolar 1 disorder (HCC) 03/20/2020  . CAD (coronary artery disease)   . Chest pain   . Chronic back pain   . Cocaine abuse (HCC) 10/16/2012  . COPD (chronic obstructive pulmonary disease) (HCC)   . COPD with chronic bronchitis (HCC) 03/20/2020  . Coronary artery disease 06/26/2015  . Diabetes (HCC)   . 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 Erectile dysfunction   . Essential hypertension 03/20/2020  . Gastro-esophageal reflux disease without esophagitis 03/20/2020  . History of non-ST elevation myocardial infarction (NSTEMI)  06/26/2015   06/01/2015 in [place]   . Hyperlipidemia   . Hypertension   . Lives in homeless shelter 03/20/2020  . Pain in joint, pelvic region and thigh 03/20/2020  . Peripheral vascular disease (HCC)    right leg stays cold  . Pneumonia   . Psychosis (HCC) 10/16/2012  . S/P CABG x 3 06/29/2015  . Tobacco user 03/20/2020   Past Surgical History:  Past Surgical History:  Procedure Laterality Date  . CARDIAC CATHETERIZATION  05/2015  . COLONOSCOPY    . CORONARY ARTERY BYPASS GRAFT N/A 06/29/2015   Procedure: CORONARY ARTERY BYPASS GRAFTING (CABG) TIMES THREE USING LEFT INTERNAL MAMMARY ARTERY AND RIGHT GREATER SAPHENOUS VEIN HARVESTED BY ENDOVEIN;  Surgeon: Loreli Slot, MD;  Location: [facility] OR;  Service: Open Heart Surgery;  Laterality: N/A;  . KNEE SURGERY Left   . TEE WITHOUT CARDIOVERSION N/A 06/29/2015   Procedure: TRANSESOPHAGEAL ECHOCARDIOGRAM (TEE);  Surgeon: Loreli Slot, MD;  Location[facility] OR;  Service: Open Heart Surgery;  Laterality: N/A;   HPI:  70 y.o. male admitted 3/4 with RLE drift, dysarthria and aphasia with later improvement and M2 occlusion. No IR or tPA. PMHx: DM II, HLD, HTN, schizophrenia, bipolar disorder, PVD, CAD, COPD, and remote stroke 20 years ago, CABG, neuropathy, Cocaine abuse.  Brain MRI reported acute infarcts involving the left corona radiata.   Assessment / Plan / Recommendation Clinical Impression  Pt was seen for a cognitive-linguistic evaluation in the setting of acute infarcts.  Pt presents with mild cognitive deficits  in sustained/focused attention, short-term memory, and complex problem solving.  Suspect that problem solving deficits were more related to decreased sustained attention rather than true problem solving deficit.  Additionally suspect that deficits may be baseline for the pt.  Pt was noted to be mildly impulsive throughout this evaluation.  No deficits were observed with expressive/receptive language skills evidenced by  100% accuracy with confrontational naming tasks, repetition tasks, following commands, and yes/no questions.  Speech was 100% intelligible at the conversation level with no dysarthria observed.  Given that pt is likely at his cognitive baseline, no ST follow up is recommended; however, recommend supervision with IADLs, particularly medications and finances, when pt is discharged.  Discussed results and recommendations with the pt and he verbalized understanding with teach back.  No further skilled ST is warranted at this time.  Please re-consult if additional needs arise.    SLP Assessment  SLP Recommendation/Assessment: Patient does not need any further Speech Lanaguage Pathology Services SLP Visit Diagnosis: Cognitive communication deficit (R41.841)    Follow Up Recommendations  Other (comment) (Supervision with IADLs)    Frequency and Duration           SLP Evaluation Cognition  Overall Cognitive Status: No family/caregiver present to determine baseline cognitive functioning Arousal/Alertness: Awake/alert Orientation Level: Oriented X4 Attention: Sustained;Focused Focused Attention: Impaired Focused Attention Impairment: Verbal complex Sustained Attention: Impaired Sustained Attention Impairment: Verbal complex Immediate Memory Recall: Sock;Blue;Bed Memory Recall Sock: Without Cue Memory Recall Blue: Without Cue Memory Recall Bed: With Cue Problem Solving: Appears intact Safety/Judgment: Appears intact       Comprehension  Auditory Comprehension Overall Auditory Comprehension: Appears within functional limits for tasks assessed Yes/No Questions: Within Functional Limits Commands: Within Functional Limits Conversation: Complex    Expression Expression Primary Mode of Expression: Verbal Verbal Expression Overall Verbal Expression: Appears within functional limits for tasks assessed Initiation: No impairment Repetition: No impairment Naming: No impairment Written  Expression Dominant Hand: Right   Oral / Motor  Oral Motor/Sensory Function Overall Oral Motor/Sensory Function: Within functional limits Motor Speech Overall Motor Speech: Appears within functional limits for tasks assessed   GO                   Villa Herb., M.S., CCC-SLP Acute Rehabilitation Services Office: [phone]  Shanon Rosser [facility] 05/13/2020, 11:57 AM

## 2020-05-13 NOTE — Progress Notes (Signed)
STROKE TEAM PROGRESS NOTE   INTERVAL HISTORY Rehab team recommends home with out pt PT/OT, however pt does not drive, so HH will be set up. Updated with case mgt. Neuro exam stable, no change. Pt has been counseled on quitting cocaine and out pt SW consult placed.   Vitals:   05/13/20 0418 05/13/20 0748 05/13/20 0749 05/13/20 1255  BP: 137/84 120/75  (!) 147/77  Pulse: 70 67  65  Resp: 15 18  16   Temp: 98.1 F (36.7 C)  (!) 97.4 F (36.3 C) 98.1 F (36.7 C)  TempSrc: Oral  Oral Oral  SpO2: 100% 99%  95%  Weight:      Height:       CBC:  Recent Labs  Lab 05/11/20 0826 05/11/20 0830 05/12/20 0240 05/13/20 0240  WBC 12.9*  --  12.9* 12.4*  NEUTROABS 10.1*  --   --   --   HGB 15.6   < > 14.4 14.2  HCT 47.2   < > 42.7 40.5  MCV 86.0  --  85.6 82.8  PLT 248  --  227 223   < > = values in this interval not displayed.   Basic Metabolic Panel:  Recent Labs  Lab 05/11/20 0826 05/11/20 0830 05/12/20 0240 05/13/20 0240  NA 139 140  --  136  K 3.6 3.6  --  3.7  CL 102 101  --  104  CO2 27  --   --  22  GLUCOSE 124* 121*  --  98  BUN 7* 6*  --  7*  CREATININE 1.02 0.90 0.86 0.84  CALCIUM 9.4  --   --  9.0   Lipid Panel:  Recent Labs  Lab 05/12/20 0240  CHOL 173  TRIG 74  HDL 40*  CHOLHDL 4.3  VLDL 15  LDLCALC 07/12/20*   HgbA1c:  Recent Labs  Lab 05/12/20 0240  HGBA1C 6.0*   Urine Drug Screen:  Recent Labs  Lab 05/11/20 1308  LABOPIA NONE DETECTED  COCAINSCRNUR POSITIVE*  LABBENZ NONE DETECTED  AMPHETMU NONE DETECTED  THCU NONE DETECTED  LABBARB NONE DETECTED    Alcohol Level  Recent Labs  Lab 05/11/20 0826  ETH <10    IMAGING past 24 hours No results found.  PHYSICAL EXAM Constitutional: Appears well-developed and well-nourished.  Psych: Affect appropriate to situation Eyes: No scleral injection HENT: No OP obstrucion. Poor dental hygiene and endentulous Head: Normocephalic.  Cardiovascular: Normal rate and regular rhythm.  Respiratory:  Effort normal  GI: Soft.  No distension. There is no tenderness.  Skin: WDI  Neuro: Mental Status: Patient is awake, alert, oriented to person, place, month, year, and situation. Patient is able to give a clear and coherent history. No aphasia or anomia. No neglect. Speech/Language: Cranial Nerves: II: Visual Fields are full. Pupils are equal, round, and reactive to light.  III,IV, VI: EOMI without ptosis or diploplia.  V: Facial sensation is symmetric to temperature VII: Facial movement is symmetric.  VIII: hearing is intact to voice X: Uvula elevates symmetrically XI: Shoulder shrug is symmetric. XII: tongue is midline without atrophy or fasciculations.  Motor: Tone is normal. Bulk is normal. 5/5 strength was present in all four extremities.  Sensory: Pt reports no "new" sensory changes, but notes that both legs and right hand are at baseline slight sensory deficit.  Plantars: Toes are downgoing bilaterally.  Cerebellar: FNF intact. Drift to RLE upon arrival. Later, resolved.    ASSESSMENT/PLAN [name] is a  70 y.o. male with history of cocaine abuse (UDS + this admit) presenting as code stroke on 3/4 with NIHSS4. He was outside time window for tPA. CTA showed prox M2 occlusion with multiple LMCA vessel atherosclerosis. NIR intervention was considered, but since his symptoms resolved, no intervention done. He was admitted to ICU for close 24h monitoring and plan call NIR if any return of stroke symptoms. BP and neuro exam have remained stable. Rehab team evals and stroke wk up have been completed.   Stroke: scattered left CR infarcts, secondary to large vessel disease with left M2 occlusion   CT head: ASPECTS 9. R PLIC and pontine old strokes. Atrophy.  CTA head and neck left M2 occlusion, right PCA occlusion bilateral ICA siphon moderate to severe stenosis  MRI  Scattered infarcts in Left CR  2D Echo 60% EF, wnl  LDL 118  HgbA1c 6.0  UDS positive for  cocaine  VTE prophylaxis - SCDs  ASA 325mg  prior to admission, now on aspirin 325 and Plavix 75 DAPT for 3 months and then Plavix alone given intracranial large vessel occlusion.  Therapy recommendations:  Out pt pt/ot  Disposition: home today w/HH; pt does not drive, cant get to freq out pt PT/OT  Hypertension  Home meds: None  Stable . Long-term BP goal 130-150 given large vessel occlusion  Hyperlipidemia  Home meds: Lipitor 80mg  resumed in hospital  LDL 118, goal < 70  High intensity statin   Continue statin at discharge  Diabetes type II Controlled  Home meds: metfomal  HgbA1c 6.0, goal < 7.0  CBGs  SSI  Close PCP follow-up  Cocaine abuse  UDS positive for cocaine  Cessation education provided  Patient is willing to quit; SW consult placed  Tobacco abuse  Current smoker  Smoking cessation counseling provided  Pt is willing to quit  Other Stroke Risk Factors  Advanced Age >/= 65   ETOH use, alcohol level <10, advised to drink no more than 1 drink(s) a day  Hx stroke/TIA on imaging and 20 years ago per chart  Coronary artery disease status post CABG  PVD  Other Active Problems  Medication non-compliance- counseled on this  Schizophrenia  Bipolar disorder  Leukocytosis, WBC 12.9  Hospital day # 2  Desiree Metzger-Cihelka, ARNP-C, ANVP-BC Pager: [phone] Stroke Neurology 05/13/2020 1:21 PM  To contact Stroke Continuity provider, please refer to [phone]. After hours, contact General Neurology

## 2020-05-13 NOTE — TOC Transition Note (Signed)
Transition of Care [facility]) - CM/SW Discharge Note   Patient Details  Name: Andre Rojas MRN: [id number] Date of Birth: Oct 16, 1950  Transition of Care [facility]) CM/SW Contact:  Lawerance Sabal, RN Phone Number: 05/13/2020, 1:58 PM   Clinical Narrative:    Sherron Monday w patient, discussed DC plan and HH providers. Referral made to and accepted by [facility].  No other CM needs identified.     Final next level of care: Home w Home Health Services Barriers to Discharge: No Barriers Identified   Patient Goals and CMS Choice Patient states their goals for this hospitalization and ongoing recovery are:: to go home CMS Medicare.gov Compare Post Acute Care list provided to:: Patient Choice offered to / list presented to : Patient  Discharge Placement                       Discharge Plan and Services                          HH Arranged: PT,OT HH Agency: [facility] Date [facility] Agency Contacted: 05/13/20 Time HH Agency Contacted: [id number] Representative spoke with at [facility] Agency: Angie  Social Determinants of Health (SDOH) Interventions     Readmission Risk Interventions No flowsheet data found.

## 2020-05-13 NOTE — Discharge Summary (Addendum)
Physician Discharge Summary  Patient ID: Andre Rojas MRN: [id number] DOB/AGE: February 20, 1951 70 y.o.  Admit date: 05/11/2020 Discharge date: 05/13/2020  Admission Diagnoses:  Discharge Diagnoses:  Active Problems:   Stroke [facility])   Discharged Condition: stable  Hospital Course: [name] is a 70 y.o. male with history of cocaine abuse (UDS + this admit) presenting as code stroke on 3/4 with NIHSS4. He was outside time window for tPA. CTA showed prox M2 occlusion with multiple LMCA vessel atherosclerosis. NIR intervention was considered, but since his symptoms resolved, no intervention done. He was admitted to ICU for close 24h monitoring and plan call NIR if any return of stroke symptoms. BP and neuro exam have remained stable. Rehab team evals and stroke wk up have been completed.  Diagnosis: Stroke: scattered left CR infarcts, secondary to large vessel disease with left M2 occlusion   CT head: ASPECTS 9. R PLIC and pontine old strokes. Atrophy.  CTA head and neck left M2 occlusion, right PCA occlusion bilateral ICA siphon moderate to severe stenosis  MRI  Scattered infarcts in Left CR  2D Echo 60% EF, wnl  LDL 118  HgbA1c 6.0  UDS positive for cocaine  VTE prophylaxis - SCDs  ASA 325mg  prior to admission, now on aspirin 325 and Plavix 75 DAPT for 3 months and then Plavix alone given intracranial large vessel occlusion.  Therapy recommendations:  Out pt pt/ot  Disposition: home today w/HH; pt does not drive, cant get to freq out pt PT/OT  Hypertension  Home meds: Pt tells me he knows he is supposed to take BP meds, but has been non-compliant. I will place Rx for home meds per his request as he has none at home, but he needs to f/u with his PCP in next 1-2 weeks for ongoing mgt.  Stable  Long-term BP goal 130-150 given large vessel occlusion  Hyperlipidemia  Home meds: Lipitor 80mg  resumed in hospital  LDL 118, goal < 70  High intensity statin   Continue  statin at discharge  Diabetes type II Controlled  Home meds: metfomal  HgbA1c 6.0, goal < 7.0  CBGs  SSI  Close PCP follow-up  Cocaine abuse  UDS positive for cocaine  Cessation education provided  Patient is willing to quit; SW consult placed  Tobacco abuse  Current smoker  Smoking cessation counseling provided  Pt is willing to quit  Other Stroke Risk Factors  Advanced Age >/= 65   ETOH use, alcohol level <10, advised to drink no more than 1 drink(s) a day  Hx stroke/TIA on imaging and 20 years ago per chart  Coronary artery disease status post CABG  PVD  Other Active Problems  Medication non-compliance- counseled on this  Schizophrenia  Bipolar disorder   Consults: Rehab team  Significant Diagnostic Studies: radiology: see above  Treatments: BP and stroke mgt  Discharge Exam: Blood pressure (!) 147/77, pulse 65, temperature 98.1 F (36.7 C), temperature source Oral, resp. rate 16, height 5\' 11"  (1.803 m), weight 73.3 kg, SpO2 95 %. Constitutional: Appears well-developed and well-nourished.  Psych: Affect appropriate to situation Eyes: No scleral injection HENT: No OP obstrucion. Poor dental hygiene and endentulous Head: Normocephalic.  Cardiovascular: Normal rate and regular rhythm.  Respiratory: Effort normal  GI: Soft. No distension. There is no tenderness.  Skin: WDI  Neuro: Mental Status: Patient is awake, alert, oriented to person, place, month, year, and situation. Patient is able to give a clear and coherent history. No aphasia or  anomia. Noneglect. Speech/Language: Cranial Nerves: II: Visual Fields are full. Pupils are equal, round, and reactive to light.  III,IV, VI: EOMI without ptosis or diploplia.  V: Facial sensation is symmetric to temperature VII: Facial movement is symmetric.  VIII: hearing is intact to voice X: Uvula elevates symmetrically XI: Shoulder shrug is symmetric. XII: tongue is midline without  atrophy or fasciculations.  Motor: Tone is normal. Bulk is normal. 5/5 strength was present in all four extremities.  Sensory: Pt reports no "new" sensory changes, but notes that both legs and right hand are at baseline slight sensory deficit.  Plantars: Toes are downgoing bilaterally.  Cerebellar: no ataxia;wnl   Disposition: Discharge disposition: 06-Home-Health Care Svc        Allergies as of 05/13/2020   No Known Allergies     Medication List    STOP taking these medications   aspirin 325 MG tablet Replaced by: aspirin 325 MG EC tablet   nicotine polacrilex 4 MG lozenge Commonly known as: Nicotine Mini     TAKE these medications   albuterol 108 (90 Base) MCG/ACT inhaler Commonly known as: Ventolin HFA Inhale 2 puffs into the lungs every 6 (six) hours as needed for wheezing or shortness of breath.   aspirin 325 MG EC tablet Take 1 tablet (325 mg total) by mouth daily. Take w/Plavix for 3 months, then stop ASA and take Plavix alone Start taking on: May 14, 2020 Replaces: aspirin 325 MG tablet   atorvastatin 80 MG tablet Commonly known as: LIPITOR Take 1 tablet (80 mg total) by mouth daily at 6 PM.   budesonide-formoterol 160-4.5 MCG/ACT inhaler Commonly known as: Symbicort Inhale 2 puffs into the lungs 2 (two) times daily.   carvedilol 12.5 MG tablet Commonly known as: COREG Take 1 tablet (12.5 mg total) by mouth 2 (two) times daily with a meal.   clopidogrel 75 MG tablet Commonly known as: PLAVIX Take 1 tablet (75 mg total) by mouth daily. Take with ASA for 3 months, then take plavix alone Start taking on: May 14, 2020   cyclobenzaprine 5 MG tablet Commonly known as: FLEXERIL Take 1 tablet (5 mg total) by mouth 3 (three) times daily.   fluticasone 50 MCG/ACT nasal spray Commonly known as: FLONASE Place 2 sprays into both nostrils daily.   lisinopril 10 MG tablet Commonly known as: ZESTRIL Take 1 tablet (10 mg total) by mouth daily.    metFORMIN 500 MG tablet Commonly known as: GLUCOPHAGE Take 1 tablet (500 mg total) by mouth 2 (two) times daily with a meal.   pantoprazole 40 MG tablet Commonly known as: Protonix Take 1 tablet (40 mg total) by mouth daily before breakfast. What changed:   when to take this  reasons to take this   QUEtiapine 50 MG tablet Commonly known as: SEROQUEL Take 50 mg by mouth at bedtime.   QUEtiapine 100 MG tablet Commonly known as: SEROquel Take 1 tablet (100 mg total) by mouth at bedtime.   traZODone 100 MG tablet Commonly known as: DESYREL TAKE 1 TABLET BY MOUTH AT BEDTIME AS NEEDED FOR SLEEP.      dc time spent:  Signed: Desiree Metzger-Cihelka 05/13/2020, 1:40 PM  ATTENDING NOTE: I reviewed above note and agree with the assessment and plan. Pt was seen and examined.   Pt awake alert, no acute event overnight, no neuro changes. Will continue DAPT and statin. Stroke risk factor modification education again provided, pt is willing to quit smoking, alcohol and cocaine. Follow up  in [facility] in 4  Weeks.  Marvel Plan, MD PhD Stroke Neurology 05/13/2020 11:59 PM

## 2020-05-14 ENCOUNTER — Encounter: Payer: Self-pay | Admitting: *Deleted

## 2020-05-14 ENCOUNTER — Telehealth: Payer: Self-pay

## 2020-05-14 ENCOUNTER — Telehealth: Payer: Self-pay | Admitting: *Deleted

## 2020-05-14 NOTE — Congregational Nurse Program (Signed)
Dept: [phone]   Congregational Nurse Program Note  Date of Encounter: 05/14/2020  Past Medical History: Past Medical History:  Diagnosis Date  . Acid reflux   . Anemia   . Arthritis    hip, right  . Bipolar 1 disorder (HCC) 03/20/2020  . CAD (coronary artery disease)   . Chest pain   . Chronic back pain   . Cocaine abuse (HCC) 10/16/2012  . COPD (chronic obstructive pulmonary disease) (HCC)   . COPD with chronic bronchitis (HCC) 03/20/2020  . Coronary artery disease 06/26/2015  . Diabetes (HCC)   . 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 Erectile dysfunction   . Essential hypertension 03/20/2020  . Gastro-esophageal reflux disease without esophagitis 03/20/2020  . History of non-ST elevation myocardial infarction (NSTEMI) 06/26/2015   06/01/2015 in [place]   . Hyperlipidemia   . Hypertension   . Lives in homeless shelter 03/20/2020  . Pain in joint, pelvic region and thigh 03/20/2020  . Peripheral vascular disease (HCC)    right leg stays cold  . Pneumonia   . Psychosis (HCC) 10/16/2012  . S/P CABG x 3 06/29/2015  . Tobacco user 03/20/2020    Encounter Details:  CNP Questionnaire - 05/14/20 1403      Questionnaire   Do you give verbal consent to treat you today? Yes    Visit Setting Other    Location Patient Served At [facility]    Patient Status Homeless    Medical Provider No    Insurance Medicaid;Medicare    Intervention Support;Educate    Housing/Utilities Within past 12 months, unable to get needed utilities (heat, electricity)    Transportation Within past 12 months, lack of transportation negatively impacted life    Interpersonal Safety Do not feel physically and emotionally safe where you currently live    Food Have food insecurities    Medication Provided medication assistance [employer], drug rep, etc.)    Referrals Medication Assistance            Dept: [phone]   Congregational Nurse Program Note  Date of Encounter:  05/14/2020  Past Medical History: Past Medical History:  Diagnosis Date  . Acid reflux   . Anemia   . Arthritis    hip, right  . Bipolar 1 disorder (HCC) 03/20/2020  . CAD (coronary artery disease)   . Chest pain   . Chronic back pain   . Cocaine abuse (HCC) 10/16/2012  . COPD (chronic obstructive pulmonary disease) (HCC)   . COPD with chronic bronchitis (HCC) 03/20/2020  . Coronary artery disease 06/26/2015  . Diabetes (HCC)   . 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 Erectile dysfunction   . Essential hypertension 03/20/2020  . Gastro-esophageal reflux disease without esophagitis 03/20/2020  . History of non-ST elevation myocardial infarction (NSTEMI) 06/26/2015   06/01/2015 in [place]   . Hyperlipidemia   . Hypertension   . Lives in homeless shelter 03/20/2020  . Pain in joint, pelvic region and thigh 03/20/2020  . Peripheral vascular disease (HCC)    right leg stays cold  . Pneumonia   . Psychosis (HCC) 10/16/2012  . S/P CABG x 3 06/29/2015  . Tobacco user 03/20/2020    Encounter Details:  CNP Questionnaire - 05/14/20 1403      Questionnaire   Do you give verbal consent to treat you today? Yes    Visit Setting Other    Location Patient Served At [facility]    Patient Status Homeless    Medical  Provider No    Insurance Medicaid;Medicare    Intervention Support;Educate    Housing/Utilities Within past 12 months, unable to get needed utilities (heat, electricity)    Transportation Within past 12 months, lack of transportation negatively impacted life    Interpersonal Safety Do not feel physically and emotionally safe where you currently live    Food Have food insecurities    Medication Provided medication assistance [employer], drug rep, etc.)    Referrals Medication Assistance            Dept: [phone]   Congregational Nurse Program Note  Date of Encounter: 05/14/2020  Past Medical History: Past Medical History:  Diagnosis Date  . Acid  reflux   . Anemia   . Arthritis    hip, right  . Bipolar 1 disorder (HCC) 03/20/2020  . CAD (coronary artery disease)   . Chest pain   . Chronic back pain   . Cocaine abuse (HCC) 10/16/2012  . COPD (chronic obstructive pulmonary disease) (HCC)   . COPD with chronic bronchitis (HCC) 03/20/2020  . Coronary artery disease 06/26/2015  . Diabetes (HCC)   . 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 Erectile dysfunction   . Essential hypertension 03/20/2020  . Gastro-esophageal reflux disease without esophagitis 03/20/2020  . History of non-ST elevation myocardial infarction (NSTEMI) 06/26/2015   06/01/2015 in [place]   . Hyperlipidemia   . Hypertension   . Lives in homeless shelter 03/20/2020  . Pain in joint, pelvic region and thigh 03/20/2020  . Peripheral vascular disease (HCC)    right leg stays cold  . Pneumonia   . Psychosis (HCC) 10/16/2012  . S/P CABG x 3 06/29/2015  . Tobacco user 03/20/2020    Encounter Details:  CNP Questionnaire - 05/14/20 1403      Questionnaire   Do you give verbal consent to treat you today? Yes    Visit Setting Other    Location Patient Served At [facility]    Patient Status Homeless    Medical Provider No    Insurance Medicaid;Medicare    Intervention Support;Educate    Housing/Utilities Within past 12 months, unable to get needed utilities (heat, electricity)    Transportation Within past 12 months, lack of transportation negatively impacted life    Interpersonal Safety Do not feel physically and emotionally safe where you currently live    Food Have food insecurities    Medication Provided medication assistance [employer], drug rep, etc.)    Referrals Medication Assistance            Dept: [phone]   Congregational Nurse Program Note  Date of Encounter: 05/14/2020  Past Medical History: Past Medical History:  Diagnosis Date  . Acid reflux   . Anemia   . Arthritis    hip, right  . Bipolar 1 disorder (HCC) 03/20/2020   . CAD (coronary artery disease)   . Chest pain   . Chronic back pain   . Cocaine abuse (HCC) 10/16/2012  . COPD (chronic obstructive pulmonary disease) (HCC)   . COPD with chronic bronchitis (HCC) 03/20/2020  . Coronary artery disease 06/26/2015  . Diabetes (HCC)   . 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 Erectile dysfunction   . Essential hypertension 03/20/2020  . Gastro-esophageal reflux disease without esophagitis 03/20/2020  . History of non-ST elevation myocardial infarction (NSTEMI) 06/26/2015   06/01/2015 in [place]   . Hyperlipidemia   . Hypertension   . Lives in homeless shelter 03/20/2020  . Pain in joint, pelvic  region and thigh 03/20/2020  . Peripheral vascular disease (HCC)    right leg stays cold  . Pneumonia   . Psychosis (HCC) 10/16/2012  . S/P CABG x 3 06/29/2015  . Tobacco user 03/20/2020    Encounter Details:  CNP Questionnaire - 05/14/20 1403      Questionnaire   Do you give verbal consent to treat you today? Yes    Visit Setting Other    Location Patient Served At [facility]    Patient Status Homeless    Medical Provider No    Insurance Medicaid;Medicare    Intervention Support;Educate    Housing/Utilities Within past 12 months, unable to get needed utilities (heat, electricity)    Transportation Within past 12 months, lack of transportation negatively impacted life    Interpersonal Safety Do not feel physically and emotionally safe where you currently live    Food Have food insecurities    Medication Provided medication assistance (Pharmacies, drug rep, etc.)    Referrals Medication Assistance           Pt states he is moving into his own apartment tomorrow 3/8. He states he needs a pharmacy that will deleiver to his home, called [facility] and made arrangements with them to deliver pt meds. Marland Kitchen

## 2020-05-14 NOTE — Telephone Encounter (Signed)
Transition Care Management Follow-up Telephone Call  Date of discharge and from where: 05/13/2020, [facility]   How have you been since you were released from the hospital? He said he is feeling all right now.   Any questions or concerns? No  Items Reviewed:  Did the pt receive and understand the discharge instructions provided? No   Medications obtained and verified? Yes  -he said he has all medications and did not have any questions about the med regime. He stated that Marin Roberts, RN just reviewed his meds with him today   Other? No   Any new allergies since your discharge? No   Do you have support at home? No  - he is currently staying at [facility] but will be moving into his own apartment tomorrow - 05/15/2020 - [address]  - [address], [place], [place] [address]  Home Care and Equipment/Supplies: Were home health services ordered? yes If so, what is the name of the agency? [facility]  Has the agency set up a time to come to the patient's home? no Were any new equipment or medical supplies ordered?  No What is the name of the medical supply agency? n/a Were you able to get the supplies/equipment? not applicable Do you have any questions related to the use of the equipment or supplies? No    Functional Questionnaire: (I = Independent and D = Dependent) ADLs: independent. He said that he already has a cane to use if needed,   Follow up appointments reviewed:   PCP Hospital f/u appt confirmed? Yes  Scheduled appointment with Dr Delford Field 05/29/2020.   Specialist Hospital f/u appt confirmed? Yes  - Echo 05/23/2020  Are transportation arrangements needed? he said he may need transportation.  He has medicaid as well as Cyt[employer] and can register for transportation services with the insurers.   If their condition worsens, is the pt aware to call PCP or go to the Emergency Dept.? Yes  Was the patient provided with contact information for the PCP's office or ED? Yes  Was to pt encouraged  to call back with questions or concerns? Yes

## 2020-05-15 ENCOUNTER — Other Ambulatory Visit: Payer: Self-pay | Admitting: Critical Care Medicine

## 2020-05-15 ENCOUNTER — Other Ambulatory Visit: Payer: Self-pay | Admitting: *Deleted

## 2020-05-15 DIAGNOSIS — I10 Essential (primary) hypertension: Secondary | ICD-10-CM

## 2020-05-15 DIAGNOSIS — K219 Gastro-esophageal reflux disease without esophagitis: Secondary | ICD-10-CM

## 2020-05-15 DIAGNOSIS — J449 Chronic obstructive pulmonary disease, unspecified: Secondary | ICD-10-CM

## 2020-05-15 MED ORDER — LISINOPRIL 10 MG PO TABS: 10.0000 mg | ORAL_TABLET | Freq: Every day | ORAL | 3 refills | Status: DC

## 2020-05-15 MED ORDER — CARVEDILOL 12.5 MG PO TABS
12.5000 mg | ORAL_TABLET | Freq: Two times a day (BID) | ORAL | 3 refills | Status: DC
Start: 2020-05-15 — End: 2020-08-16

## 2020-05-15 MED ORDER — CLOPIDOGREL BISULFATE 75 MG PO TABS: 75.0000 mg | ORAL_TABLET | Freq: Every day | ORAL | 3 refills | Status: DC

## 2020-05-15 MED ORDER — ATORVASTATIN CALCIUM 80 MG PO TABS: 80.0000 mg | ORAL_TABLET | Freq: Every day | ORAL | 1 refills | Status: DC

## 2020-05-15 MED ORDER — CARVEDILOL 12.5 MG PO TABS
12.5000 mg | ORAL_TABLET | Freq: Two times a day (BID) | ORAL | 3 refills | Status: DC
Start: 2020-05-15 — End: 2020-05-15

## 2020-05-15 NOTE — Telephone Encounter (Signed)
Requested Prescriptions  Pending Prescriptions Disp Refills  . fluticasone (FLONASE) 50 MCG/ACT nasal spray [Pharmacy Med Name: fluticasone propionate 50 mcg/actuation nasal spray,suspension] 16 g 2    Sig: USE 2 SPRAYS in each nostril EVERY DAY     Ear, Nose, and Throat: Nasal Preparations - Corticosteroids Passed - 05/15/2020  3:25 PM      Passed - Valid encounter within last 12 months    Recent Outpatient Visits          1 month ago Coronary artery disease due to calcified coronary lesion   [facility] Storm Frisk, MD      Future Appointments            In 2 weeks Storm Frisk, MD [facility]           . pantoprazole (PROTONIX) 40 MG tablet [Pharmacy Med Name: pantoprazole 40 mg tablet,delayed release] 40 tablet 3    Sig: TAKE 1 TABLET BY MOUTH EVERY DAY WITH BREAKFAST     Gastroenterology: Proton Pump Inhibitors Passed - 05/15/2020  3:25 PM      Passed - Valid encounter within last 12 months    Recent Outpatient Visits          1 month ago Coronary artery disease due to calcified coronary lesion   [facility] Storm Frisk, MD      Future Appointments            In 2 weeks Storm Frisk, MD [facility]           . QC ENTERIC ASPIRIN 325 MG EC tablet [Pharmacy Med Name: aspirin 325 mg tablet,delayed release] 60 tablet     Sig: TAKE 1 TABLET BY MOUTH EVERY DAY     Analgesics:  NSAIDS - aspirin Passed - 05/15/2020  3:25 PM      Passed - Patient is not pregnant      Passed - Valid encounter within last 12 months    Recent Outpatient Visits          1 month ago Coronary artery disease due to calcified coronary lesion   [facility] Storm Frisk, MD      Future Appointments            In 2 weeks Storm Frisk, MD [facility]

## 2020-05-17 ENCOUNTER — Telehealth: Payer: Self-pay

## 2020-05-17 NOTE — Telephone Encounter (Signed)
Call placed to patient. He confirmed that he moved to [facility].  Address and phone number updated. He has not yet contacted [facility] or DSS about transportation to his medical appointments.. Instructed him to try to call the companies and if he his having difficulty to contact this clinic.  He said that he understood and confirmed he had the phone number for [facility]

## 2020-05-22 ENCOUNTER — Other Ambulatory Visit (HOSPITAL_COMMUNITY): Payer: Medicare HMO

## 2020-05-23 ENCOUNTER — Ambulatory Visit (HOSPITAL_COMMUNITY): Payer: Medicare HMO | Attending: Cardiology

## 2020-05-23 ENCOUNTER — Encounter (HOSPITAL_COMMUNITY): Payer: Self-pay | Admitting: Cardiology

## 2020-05-29 ENCOUNTER — Ambulatory Visit: Payer: Medicare HMO | Admitting: Critical Care Medicine

## 2020-05-29 NOTE — Progress Notes (Deleted)
Established Patient Office Visit  Subjective:  Patient ID: Andre Rojas, male    DOB: [birth year]  Age: 70 y.o. MRN: [id number]  CC: No chief complaint on file.   HPI Andre Rojas is a 70 y.o M presenting today for a hospital follow-up.   [name] presented as a code stroke today. States he was fine before bed but upon awakening, he had difficulty getting his words out and was weak on left side (per EMS). CBG 139 and BP 130s in the field. He did present with some aphasia at the bridge. Patient was taken emergently to CT scanner.   Upon first NIHSS, he had RLE drift, dysarthria and aphasia. He later had great improvement of his symptoms.   CTA showed M2 occlusion and since symptoms had resolved, he was not taken to IR nor given tPA due to his LKW being outside the window.  70 yo male who presented as a code stroke. His NIHSS was 4 which improved to 0. Decision was made not to give tPA due to being outside the window. CTA showed occlusion of a proximal M2 left MCA vessel with multiple areas of atherosclerosis. Patient was not taken to IR at this time due to resolvement of symptoms. However, he will be admitted to ICU and closely watched  Past Medical History:  Diagnosis Date  . Acid reflux   . Anemia   . Arthritis    hip, right  . Bipolar 1 disorder (HCC) 03/20/2020  . CAD (coronary artery disease)   . Chest pain   . Chronic back pain   . Cocaine abuse (HCC) 10/16/2012  . COPD (chronic obstructive pulmonary disease) (HCC)   . COPD with chronic bronchitis (HCC) 03/20/2020  . Coronary artery disease 06/26/2015  . Diabetes (HCC)   . 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 Erectile dysfunction   . Essential hypertension 03/20/2020  . Gastro-esophageal reflux disease without esophagitis 03/20/2020  . History of non-ST elevation myocardial infarction (NSTEMI) 06/26/2015   06/01/2015 in [place]   . Hyperlipidemia   . Hypertension   . Lives in homeless shelter 03/20/2020   . Pain in joint, pelvic region and thigh 03/20/2020  . Peripheral vascular disease (HCC)    right leg stays cold  . Pneumonia   . Psychosis (HCC) 10/16/2012  . S/P CABG x 3 06/29/2015  . Tobacco user 03/20/2020    Past Surgical History:  Procedure Laterality Date  . CARDIAC CATHETERIZATION  05/2015  . COLONOSCOPY    . CORONARY ARTERY BYPASS GRAFT N/A 06/29/2015   Procedure: CORONARY ARTERY BYPASS GRAFTING (CABG) TIMES THREE USING LEFT INTERNAL MAMMARY ARTERY AND RIGHT GREATER SAPHENOUS VEIN HARVESTED BY ENDOVEIN;  Surgeon: Loreli Slot, MD;  Location: [facility] OR;  Service: Open Heart Surgery;  Laterality: N/A;  . KNEE SURGERY Left   . TEE WITHOUT CARDIOVERSION N/A 06/29/2015   Procedure: TRANSESOPHAGEAL ECHOCARDIOGRAM (TEE);  Surgeon: Loreli Slot, MD;  [facility];  Service: Open Heart Surgery;  Laterality: N/A;    Family History  Problem Relation Age of Onset  . Cancer Mother        stomach  . Heart disease Father     Social History   Socioeconomic History  . Marital status: Single    Spouse name: Not on file  . Number of children: Not on file  . Years of education: Not on file  . Highest education level: Not on file  Occupational History  . Not on file  Tobacco Use  . Smoking status: Current Every Day Smoker    Packs/day: 0.50    Years: 40.00    Pack years: 20.00    Types: Cigarettes  . Smokeless tobacco: Never Used  Vaping Use  . Vaping Use: Never used  Substance and Sexual Activity  . Alcohol use: Yes    Comment: 1/5 per day - Pt states he may only have one or 2 drinks if he drinks any (noted 06/27/15)  . Drug use: Not Currently    Types: "Crack" cocaine, Cocaine    Comment: yesterday   . Sexual activity: Yes    Birth control/protection: Condom  Other Topics Concern  . Not on file  Social History Narrative  . Not on file   Social Determinants of Health   Financial Resource Strain: Not on file  Food Insecurity: Not on file  Transportation  Needs: Not on file  Physical Activity: Not on file  Stress: Not on file  Social Connections: Not on file  Intimate Partner Violence: Not on file    Outpatient Medications Prior to Visit  Medication Sig Dispense Refill  . albuterol (VENTOLIN HFA) 108 (90 Base) MCG/ACT inhaler Inhale 2 puffs into the lungs every 6 (six) hours as needed for wheezing or shortness of breath. 18 g 2  . aspirin EC 325 MG EC tablet Take 1 tablet (325 mg total) by mouth daily. Take w/Plavix for 3 months, then stop ASA and take Plavix alone 30 tablet 0  . atorvastatin (LIPITOR) 80 MG tablet Take 1 tablet (80 mg total) by mouth daily at 6 PM. 90 tablet 1  . budesonide-formoterol (SYMBICORT) 160-4.5 MCG/ACT inhaler Inhale 2 puffs into the lungs 2 (two) times daily. 1 each 12  . carvedilol (COREG) 12.5 MG tablet Take 1 tablet (12.5 mg total) by mouth 2 (two) times daily with a meal. 180 tablet 3  . clopidogrel (PLAVIX) 75 MG tablet Take 1 tablet (75 mg total) by mouth daily. Take with ASA for 3 months, then take plavix alone 90 tablet 3  . cyclobenzaprine (FLEXERIL) 5 MG tablet Take 1 tablet (5 mg total) by mouth 3 (three) times daily. (Patient not taking: No sig reported) 90 tablet 1  . fluticasone (FLONASE) 50 MCG/ACT nasal spray USE 2 SPRAYS in each nostril EVERY DAY 16 g 2  . lisinopril (ZESTRIL) 10 MG tablet Take 1 tablet (10 mg total) by mouth daily. 90 tablet 3  . metFORMIN (GLUCOPHAGE) 500 MG tablet Take 1 tablet (500 mg total) by mouth 2 (two) times daily with a meal. 180 tablet 3  . pantoprazole (PROTONIX) 40 MG tablet TAKE 1 TABLET BY MOUTH EVERY DAY WITH BREAKFAST 40 tablet 3  . QUEtiapine (SEROQUEL) 100 MG tablet Take 1 tablet (100 mg total) by mouth at bedtime. (Patient not taking: Reported on 05/11/2020) 30 tablet 3  . QUEtiapine (SEROQUEL) 50 MG tablet Take 50 mg by mouth at bedtime.    . traZODone (DESYREL) 100 MG tablet TAKE 1 TABLET BY MOUTH AT BEDTIME AS NEEDED FOR SLEEP. (Patient not taking: No sig  reported) 90 tablet 1   No facility-administered medications prior to visit.    No Known Allergies  ROS Review of Systems    Objective:    Physical Exam  There were no vitals taken for this visit. Wt Readings from Last 3 Encounters:  05/11/20 73.3 kg  04/27/20 78.7 kg  04/20/20 76.2 kg     Health Maintenance Due  Topic Date Due  . FOOT  EXAM  Never done  . OPHTHALMOLOGY EXAM  Never done  . COLONOSCOPY (Pts 45-7yrs Insurance coverage will need to be confirmed)  Never done  . COVID-19 Vaccine (2 - Booster for Janssen series) 03/21/2020    There are no preventive care reminders to display for this patient.  Lab Results  Component Value Date   TSH 2.256 12/16/2017   Lab Results  Component Value Date   WBC 12.4 (H) 05/13/2020   HGB 14.2 05/13/2020   HCT 40.5 05/13/2020   MCV 82.8 05/13/2020   PLT 223 05/13/2020   Lab Results  Component Value Date   NA 136 05/13/2020   K 3.7 05/13/2020   CO2 22 05/13/2020   GLUCOSE 98 05/13/2020   BUN 7 (L) 05/13/2020   CREATININE 0.84 05/13/2020   BILITOT 2.6 (H) 05/11/2020   ALKPHOS 115 05/11/2020   AST 19 05/11/2020   ALT 15 05/11/2020   PROT 6.8 05/11/2020   ALBUMIN 3.8 05/11/2020   CALCIUM 9.0 05/13/2020   ANIONGAP 10 05/13/2020   Lab Results  Component Value Date   CHOL 173 05/12/2020   Lab Results  Component Value Date   HDL 40 (L) 05/12/2020   Lab Results  Component Value Date   LDLCALC 118 (H) 05/12/2020   Lab Results  Component Value Date   TRIG 74 05/12/2020   Lab Results  Component Value Date   CHOLHDL 4.3 05/12/2020   Lab Results  Component Value Date   HGBA1C 6.0 (H) 05/12/2020      Assessment & Plan:   Problem List Items Addressed This Visit   None     No orders of the defined types were placed in this encounter.   Follow-up: No follow-ups on file.    Cyril Loosen, RN

## 2020-06-01 ENCOUNTER — Other Ambulatory Visit: Payer: Medicare HMO

## 2020-06-06 ENCOUNTER — Telehealth (HOSPITAL_COMMUNITY): Payer: Self-pay | Admitting: Cardiology

## 2020-06-06 NOTE — Telephone Encounter (Signed)
Just an FYI. We have made several attempts to contact this patient including sending a letter to schedule or reschedule their echocardiogram. We will be removing the patient from the echo WQ.  05/23/20 NO SHOWED - MAILED LETTER [facility]     Thank you

## 2020-07-23 ENCOUNTER — Other Ambulatory Visit: Payer: Self-pay | Admitting: Critical Care Medicine

## 2020-07-23 DIAGNOSIS — K219 Gastro-esophageal reflux disease without esophagitis: Secondary | ICD-10-CM

## 2020-08-13 ENCOUNTER — Other Ambulatory Visit: Payer: Self-pay | Admitting: Critical Care Medicine

## 2020-08-13 DIAGNOSIS — J449 Chronic obstructive pulmonary disease, unspecified: Secondary | ICD-10-CM

## 2020-08-15 ENCOUNTER — Encounter: Payer: Self-pay | Admitting: Family Medicine

## 2020-08-15 ENCOUNTER — Ambulatory Visit: Payer: 59 | Attending: Family Medicine | Admitting: Family Medicine

## 2020-08-15 ENCOUNTER — Other Ambulatory Visit: Payer: Self-pay

## 2020-08-15 VITALS — BP 112/72 | Resp 18 | Ht 70.0 in | Wt 145.0 lb

## 2020-08-15 DIAGNOSIS — I2510 Atherosclerotic heart disease of native coronary artery without angina pectoris: Secondary | ICD-10-CM | POA: Diagnosis not present

## 2020-08-15 DIAGNOSIS — R29898 Other symptoms and signs involving the musculoskeletal system: Secondary | ICD-10-CM

## 2020-08-15 DIAGNOSIS — R638 Other symptoms and signs concerning food and fluid intake: Secondary | ICD-10-CM

## 2020-08-15 DIAGNOSIS — E119 Type 2 diabetes mellitus without complications: Secondary | ICD-10-CM | POA: Diagnosis not present

## 2020-08-15 DIAGNOSIS — F1410 Cocaine abuse, uncomplicated: Secondary | ICD-10-CM

## 2020-08-15 DIAGNOSIS — Z0000 Encounter for general adult medical examination without abnormal findings: Secondary | ICD-10-CM

## 2020-08-15 DIAGNOSIS — Z8673 Personal history of transient ischemic attack (TIA), and cerebral infarction without residual deficits: Secondary | ICD-10-CM

## 2020-08-15 DIAGNOSIS — R6889 Other general symptoms and signs: Secondary | ICD-10-CM

## 2020-08-15 DIAGNOSIS — Z9114 Patient's other noncompliance with medication regimen: Secondary | ICD-10-CM

## 2020-08-15 DIAGNOSIS — Z748 Other problems related to care provider dependency: Secondary | ICD-10-CM

## 2020-08-15 NOTE — Patient Instructions (Addendum)
Return tomorrow for your Appointment with Dr. Delford Field.  Referral placed for neurology and cardiology   Referral social work for assistance with food and transportation      Andre Rojas , Thank you for taking time to come for your Medicare Wellness Visit. I appreciate your ongoing commitment to your health goals. Please review the following plan we discussed and let me know if I can assist you in the future.   These are the goals we discussed: Goals   None     This is a list of the screening recommended for you and due dates:  Health Maintenance  Topic Date Due  . Pneumococcal Vaccination (1 of 4 - PCV13) Never done  . Complete foot exam   Never done  . Eye exam for diabetics  Never done  . Colon Cancer Screening  Never done  . Zoster (Shingles) Vaccine (1 of 2) Never done  . COVID-19 Vaccine (2 - Booster for [employer] series) 03/21/2020  . Flu Shot  10/08/2020  . Hemoglobin A1C  11/12/2020  . Urine Protein Check  03/20/2021  . Pneumonia vaccines (2 of 2 - PPSV23) 03/20/2021  . Tetanus Vaccine  03/20/2030  . Hepatitis C Screening: USPSTF Recommendation to screen - Ages 51-79 yo.  Completed  . HPV Vaccine  Aged Out

## 2020-08-15 NOTE — Progress Notes (Signed)
Subjective:   Andre Rojas is a 70 y.o. male who presents for Medicare Annual/Subsequent preventive examination.   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   Last Hospitalization 05/11/2020 Acute ischemic CVA No post hospital follow-up per patient due inability to obtain transportation to appointments and loss of access to phone.   New referrals placed for cardiology and neurology  Medication non-compliance remains a barrier to care: Patient has been without medications for over the last two months since being discharged from the hospital.  Review of Systems  Pertinent negatives listed in HPI     Objective:    Today's Vitals   08/15/20 1348  BP: 112/72  Resp: 18  Weight: 145 lb (65.8 kg)  Height: 5\' 10"  (1.778 m)   Body mass index is 20.81 kg/m.  Advanced Directives 05/11/2020 03/04/2018 12/15/2017 07/01/2015 06/27/2015 06/08/2015 11/21/2014  Does Patient Have a Medical Advance Directive? No No No No No No No  Would patient like information on creating a medical advance directive? No - Patient declined - - No - patient declined information No - patient declined information No - patient declined information -  Some encounter information is confidential and restricted. Go to Review Flowsheets activity to see all data.    Current Medications (verified) Outpatient Encounter Medications as of 08/15/2020   Medication Sig   albuterol (VENTOLIN HFA) 108 (90 Base) MCG/ACT inhaler Inhale 2 puffs into the lungs every 6 (six) hours as needed for wheezing or shortness of breath.   atorvastatin (LIPITOR) 80 MG tablet Take 1 tablet (80 mg total) by mouth daily at 6 PM.   budesonide-formoterol (SYMBICORT) 160-4.5 MCG/ACT inhaler Inhale 2 puffs into the lungs 2 (two) times daily.   carvedilol (COREG) 12.5 MG tablet Take 1 tablet (12.5 mg total) by mouth 2 (two) times daily with a meal.   clopidogrel (PLAVIX) 75 MG tablet Take 1 tablet (75 mg total) by mouth daily. Take with ASA for 3 months, then take plavix alone   cyclobenzaprine (FLEXERIL) 5 MG tablet Take 1 tablet (5 mg total) by mouth 3 (three) times daily. (Patient not taking: No sig reported)   fluticasone (FLONASE) 50 MCG/ACT nasal spray USE 2 SPRAYS in each nostril EVERY DAY   lisinopril (ZESTRIL) 10 MG tablet Take 1 tablet (10 mg total) by mouth daily.   metFORMIN (GLUCOPHAGE) 500 MG tablet Take 1 tablet (500 mg total) by mouth 2 (two) times daily with a meal.   pantoprazole (PROTONIX) 40 MG tablet TAKE 1 TABLET BY MOUTH EVERY DAY WITH BREAKFAST   QUEtiapine (SEROQUEL) 100 MG tablet Take 1 tablet (100 mg total) by mouth at bedtime. (Patient not taking: Reported on 05/11/2020)   QUEtiapine (SEROQUEL) 50 MG tablet Take 50 mg by mouth at bedtime.   traZODone (DESYREL) 100 MG tablet TAKE 1 TABLET BY MOUTH AT BEDTIME AS NEEDED FOR SLEEP. (Patient not taking: No sig reported)   No facility-administered encounter medications on file as of 08/15/2020.    Allergies (verified) Patient has no known allergies.   History: Past Medical  History:  Diagnosis Date   Acid reflux    Anemia    Arthritis    hip, right   Bipolar 1 disorder (HCC) 03/20/2020   CAD (coronary artery disease)    Chest pain    Chronic back pain    Cocaine abuse (HCC) 10/16/2012   COPD (chronic obstructive pulmonary disease) (HCC)    COPD with chronic bronchitis (HCC) 03/20/2020    Coronary artery disease 06/26/2015   Diabetes (HCC)    Disorganized schizophrenia (HCC) 06/16/2018   Erectile dysfunction    Essential hypertension 03/20/2020   Gastro-esophageal reflux disease without esophagitis 03/20/2020   History of non-ST elevation myocardial infarction (NSTEMI) 06/26/2015   06/01/2015 in [place]    Hyperlipidemia    Hypertension    Lives in homeless shelter 03/20/2020   Pain in joint, pelvic region and thigh 03/20/2020   Peripheral vascular disease (HCC)    right leg stays cold   Pneumonia    Psychosis (HCC) 10/16/2012   S/P CABG x 3 06/29/2015   Tobacco user 03/20/2020   Past Surgical History:  Procedure Laterality Date   CARDIAC CATHETERIZATION  05/2015   COLONOSCOPY     CORONARY ARTERY BYPASS GRAFT N/A 06/29/2015   Procedure: CORONARY ARTERY BYPASS GRAFTING (CABG) TIMES THREE USING LEFT INTERNAL MAMMARY ARTERY AND RIGHT GREATER SAPHENOUS VEIN HARVESTED BY ENDOVEIN;  Surgeon: Loreli Slot, MD;  Location: [facility] OR;  Service: Open Heart Surgery;  Laterality: N/A;   KNEE SURGERY Left    TEE WITHOUT CARDIOVERSION N/A 06/29/2015   Procedure: TRANSESOPHAGEAL ECHOCARDIOGRAM (TEE);  Surgeon: Loreli Slot, MD;  [facility];  Service: Open Heart Surgery;  Laterality: N/A;   Family History  Problem Relation Age of Onset   Cancer Mother        stomach   Heart disease Father    Social History   Socioeconomic History   Marital status: Single    Spouse name: Not on file   Number of children: Not on file   Years of education: Not on file   Highest education level: Not on file  Occupational History   Not on file  Tobacco Use   Smoking status: Current Every Day Smoker    Packs/day: 0.25    Years: 40.00    Pack years: 10.00    Types: Cigarettes   Smokeless tobacco: Never Used  Vaping Use   Vaping Use: Never used  Substance and Sexual Activity   Alcohol use: Not Currently    Comment: 1/5 per day - Pt states he may only have one or 2 drinks if  he drinks any (noted 06/27/15)   Drug use: Not Currently    Types: "Crack" cocaine, Cocaine    Comment: yesterday    Sexual activity: Yes    Birth control/protection: Condom  Other Topics Concern   Not on file  Social History Narrative   Not on file   Social Determinants of Health   Financial Resource Strain: Not on file  Food Insecurity: Not on file  Transportation Needs: Not on file  Physical Activity: Not on file  Stress: Not on file  Social Connections: Not on file    Tobacco Counseling Ready to quit: Yes Counseling given: Yes  Cocaine Use: None in 3 months   Denies daily ETOH use at present        [employer] Visit from 08/15/2020 in [facility]  AUDIT-C Score 4  Activities of Daily Living No flowsheet data found.  Patient Care Team: Storm FriskWright, Patrick E, MD as PCP - General (Pulmonary Disease & Primary Care Provider  Yates DecampGanji, Jay, MD as Consulting Physician (Cardiology) Loreli SlotHendrickson, Steven C, MD as Consulting Physician (Cardiothoracic Surgery)  Indicate any recent Medical Services you may have received from other than [facility] providers in the past year (date may be approximate).     Assessment:   This is a routine wellness examination for Andre NajjarLarry.  Hearing/Vision screen No exam data present  Dietary issues and exercise activities discussed:  Unintentional weight loss. Patient needs food resources. Social work referral placed.  Goals Addressed   None    Depression Screen No flowsheet data found.  Fall Risk Fall Risk  03/20/2020  Falls in the past year? 0  Number falls in past yr: 1  Injury with Fall? 0  Risk for fall due to : Impaired mobility;Impaired balance/gait  Follow up Falls evaluation completed    FALL RISK PREVENTION PERTAINING TO THE HOME: Any stairs in or around the home? No  If so, are there any without handrails? No  Home free of loose throw rugs in walkways, pet beds, electrical cords,  etc? No  Adequate lighting in your home to reduce risk of falls? Yes   ASSISTIVE DEVICES UTILIZED TO PREVENT FALLS:  Life alert? No  Use of a cane, walker or w/c? No  Grab bars in the bathroom? No  Shower chair or bench in shower? No  Elevated toilet seat or a handicapped toilet? No   TIMED UP AND GO:  Was the test performed? Yes .  Length of time to ambulate 10 feet: 10 sec.   Gait unsteady without use of assistive device, provider informed and interventions were implemented  Cognitive Function:        Immunizations Immunization History  Administered Date(s) Administered   Influenza Whole 12/09/2010   Influenza, Seasonal, Injecte, Preservative Fre 12/08/2016   Influenza,inj,Quad PF,6+ Mos 03/20/2020   Influenza,inj,quad, With Preservative 12/17/2012   Janssen (J&J) SARS-COV-2 Vaccination 01/25/2020   Pneumococcal Conjugate-13 03/20/2020   Pneumococcal Polysaccharide-23 12/17/2012   Tdap 03/20/2020    TDAP status: Up to date  Flu Vaccine status: Up to date  Pneumococcal vaccine status: Up to date  Covid-19 vaccine status:1/3 Andre DibblesJanssen    Qualifies for Shingles Vaccine? No    Screening Tests Health Maintenance  Topic Date Due   Pneumococcal Vaccine 520-68 Years old (1 of 4 - PCV13) Never done   FOOT EXAM  Never done   OPHTHALMOLOGY EXAM  Never done   COLONOSCOPY (Pts 45-7425yrs Insurance coverage will need to be confirmed)  Never done   Zoster Vaccines- Shingrix (1 of 2) Never done   COVID-19 Vaccine (2 - Booster for [employer] series) 03/21/2020   INFLUENZA VACCINE  10/08/2020   HEMOGLOBIN A1C  11/12/2020   URINE MICROALBUMIN  03/20/2021   PNA vac Low Risk Adult (2 of 2 - PPSV23) 03/20/2021   TETANUS/TDAP  03/20/2030   Hepatitis C Screening  Completed   HPV VACCINES  Aged Out    Health Maintenance  Health Maintenance Due  Topic Date Due   Pneumococcal Vaccine 200-68 Years old (1 of 4 - PCV13) Never done   FOOT EXAM  Never done   OPHTHALMOLOGY EXAM  Never  done   COLONOSCOPY (Pts 45-5125yrs Insurance coverage will need to be confirmed)  Never done   Zoster Vaccines- Shingrix (1 of 2) Never done   COVID-19 Vaccine (2 - Booster for  Janssen series) 03/21/2020    Colorectal Cancer: Overdue, pt lacks transportation   Lung Cancer Screening: (Low Dose CT Chest recommended if Age 22-80 years, 30 pack-year currently smoking OR have quit w/in 15years.) -Recommended deferred for now, due to patient has more immediate needs   Lung Cancer Screening Referral: deferred   Additional Screening:  Hepatitis C Screening: does qualify; Completed 03/20/2020  Vision Screening: Recommended annual ophthalmology exams for early detection of glaucoma and other disorders of the eye. Is the patient up to date with their annual eye exam?  No  Who is the provider or what is the name of the office in which the patient attends annual eye exams? None  If pt is not established with a provider, would they like to be referred to a provider to establish care? No .   Dental Screening: Recommended annual dental exams for proper oral hygiene  Community Resource Referral / Chronic Care Management: Social Work referral placed      Plan:     I have personally reviewed and noted the following in the patient's chart:   Medical and social history Use of alcohol, tobacco or illicit drugs  Current medications  Functional ability and status Nutritional status Physical activity Advanced directives List of other physicians Hospitalizations, surgeries, and ER visits in previous 12 months Vitals Screenings to include cognitive, depression, and falls Referrals and appointments  In addition, I have reviewed and discussed with patient certain preventive protocols, quality metrics, and best practice recommendations. A written personalized care plan for preventive services as well as general preventive health recommendations were provided to patient.     Joaquin Courts,  FNP   08/15/2020

## 2020-08-16 ENCOUNTER — Ambulatory Visit: Payer: 59 | Attending: Critical Care Medicine | Admitting: Critical Care Medicine

## 2020-08-16 ENCOUNTER — Encounter: Payer: Self-pay | Admitting: Critical Care Medicine

## 2020-08-16 ENCOUNTER — Telehealth: Payer: Self-pay

## 2020-08-16 VITALS — BP 102/62 | HR 69 | Resp 16 | Wt 152.0 lb

## 2020-08-16 DIAGNOSIS — I10 Essential (primary) hypertension: Secondary | ICD-10-CM

## 2020-08-16 DIAGNOSIS — F17200 Nicotine dependence, unspecified, uncomplicated: Secondary | ICD-10-CM | POA: Diagnosis not present

## 2020-08-16 DIAGNOSIS — I69398 Other sequelae of cerebral infarction: Secondary | ICD-10-CM | POA: Insufficient documentation

## 2020-08-16 DIAGNOSIS — F319 Bipolar disorder, unspecified: Secondary | ICD-10-CM

## 2020-08-16 DIAGNOSIS — F1410 Cocaine abuse, uncomplicated: Secondary | ICD-10-CM

## 2020-08-16 DIAGNOSIS — E119 Type 2 diabetes mellitus without complications: Secondary | ICD-10-CM | POA: Diagnosis not present

## 2020-08-16 DIAGNOSIS — R269 Unspecified abnormalities of gait and mobility: Secondary | ICD-10-CM

## 2020-08-16 DIAGNOSIS — K219 Gastro-esophageal reflux disease without esophagitis: Secondary | ICD-10-CM

## 2020-08-16 DIAGNOSIS — Z8673 Personal history of transient ischemic attack (TIA), and cerebral infarction without residual deficits: Secondary | ICD-10-CM

## 2020-08-16 DIAGNOSIS — F201 Disorganized schizophrenia: Secondary | ICD-10-CM

## 2020-08-16 DIAGNOSIS — F17210 Nicotine dependence, cigarettes, uncomplicated: Secondary | ICD-10-CM

## 2020-08-16 DIAGNOSIS — B351 Tinea unguium: Secondary | ICD-10-CM

## 2020-08-16 DIAGNOSIS — Z720 Tobacco use: Secondary | ICD-10-CM

## 2020-08-16 DIAGNOSIS — I2510 Atherosclerotic heart disease of native coronary artery without angina pectoris: Secondary | ICD-10-CM

## 2020-08-16 DIAGNOSIS — E785 Hyperlipidemia, unspecified: Secondary | ICD-10-CM

## 2020-08-16 DIAGNOSIS — J449 Chronic obstructive pulmonary disease, unspecified: Secondary | ICD-10-CM

## 2020-08-16 LAB — POCT GLYCOSYLATED HEMOGLOBIN (HGB A1C): Hemoglobin A1C: 5.9 % — AB (ref 4.0–5.6)

## 2020-08-16 LAB — GLUCOSE, POCT (MANUAL RESULT ENTRY): POC Glucose: 104 mg/dl — AB (ref 70–99)

## 2020-08-16 MED ORDER — CARVEDILOL 12.5 MG PO TABS
12.5000 mg | ORAL_TABLET | Freq: Two times a day (BID) | ORAL | 3 refills | Status: DC
Start: 2020-08-16 — End: 2022-06-13

## 2020-08-16 MED ORDER — ASPIRIN EC 325 MG PO TBEC: 325.0000 mg | DELAYED_RELEASE_TABLET | Freq: Every day | ORAL | 0 refills | Status: DC

## 2020-08-16 MED ORDER — TRAZODONE HCL 100 MG PO TABS: 100.0000 mg | ORAL_TABLET | Freq: Every evening | ORAL | 1 refills | Status: DC | PRN

## 2020-08-16 MED ORDER — PANTOPRAZOLE SODIUM 40 MG PO TBEC: DELAYED_RELEASE_TABLET | ORAL | 1 refills | Status: DC

## 2020-08-16 MED ORDER — BUDESONIDE-FORMOTEROL FUMARATE 160-4.5 MCG/ACT IN AERO: 2.0000 | INHALATION_SPRAY | Freq: Two times a day (BID) | RESPIRATORY_TRACT | 12 refills | Status: DC

## 2020-08-16 MED ORDER — METFORMIN HCL 500 MG PO TABS: 500.0000 mg | ORAL_TABLET | Freq: Every day | ORAL | 3 refills | Status: DC

## 2020-08-16 MED ORDER — ALBUTEROL SULFATE HFA 108 (90 BASE) MCG/ACT IN AERS: 2.0000 | INHALATION_SPRAY | Freq: Four times a day (QID) | RESPIRATORY_TRACT | 2 refills | Status: DC | PRN

## 2020-08-16 MED ORDER — QUETIAPINE FUMARATE 100 MG PO TABS
100.0000 mg | ORAL_TABLET | Freq: Every day | ORAL | 3 refills | Status: DC
Start: 2020-08-16 — End: 2022-06-13

## 2020-08-16 MED ORDER — ATORVASTATIN CALCIUM 80 MG PO TABS: 80.0000 mg | ORAL_TABLET | Freq: Every day | ORAL | 1 refills | Status: DC

## 2020-08-16 MED ORDER — CLOPIDOGREL BISULFATE 75 MG PO TABS: 75.0000 mg | ORAL_TABLET | Freq: Every day | ORAL | 3 refills | Status: DC

## 2020-08-16 NOTE — Assessment & Plan Note (Signed)
Resume seroquel and trazadone

## 2020-08-16 NOTE — Assessment & Plan Note (Signed)
Take Seroquel 100 mg daily. Take Trazadone 100 mg at bedtime as needed. Follow up at next appointment.

## 2020-08-16 NOTE — Assessment & Plan Note (Signed)
Resume ASA x one month Resume plavis daily

## 2020-08-16 NOTE — Assessment & Plan Note (Signed)
Ongoing tobacco use counseling given     . Current smoking consumption amount: 1 pack a day  . Dicsussion on advise to quit smoking and smoking impacts: Cardiovascular lung impacts  . Patient's willingness to quit: Not yet ready  . Methods to quit smoking discussed: Nicotine replacement  . Medication management of smoking session drugs discussed: Nicotine replacement however not indicated due to vascular disease  . Resources provided:  AVS   Setting quit date not established . Follow-up arranged 1 month   Time spent counseling the patient: 5 minutes

## 2020-08-16 NOTE — Assessment & Plan Note (Addendum)
Take carvedilol 12.5 mg twice  daily. Follow up at next appointment

## 2020-08-16 NOTE — Assessment & Plan Note (Signed)
Pt says no cocaine since 05/2020  observe

## 2020-08-16 NOTE — Telephone Encounter (Signed)
Met with the patient today when he was in the clinic and provided him with contact numbers to call Medicaid as well as [place] to register for transportation services.  Instructed him to call this CM if he has any difficulties.  He can also be registered for [employer] if needed.  He understands that he will need transportation to his therapy appointments when they are scheduled.

## 2020-08-16 NOTE — Assessment & Plan Note (Signed)
Take Protonix 40 mg daily. Follow up at next appointment.

## 2020-08-16 NOTE — Assessment & Plan Note (Signed)
Frequent falls and gait disturbance after stroke.  Pt never had PT after hosp d/c  Ref to PT

## 2020-08-16 NOTE — Assessment & Plan Note (Signed)
Take Aspirin 325 mg daily for one month. Take Plavix 75 mg daily.  Follow up at next appointment

## 2020-08-16 NOTE — Assessment & Plan Note (Signed)
Take Lipitor 80 mg daily. Follow up at next appointment.

## 2020-08-16 NOTE — Progress Notes (Signed)
Subjective:    Patient ID: Andre Rojas, male    DOB: 06/19/1950, 70 y.o.   MRN: [id number]  03/20/20: 70 y.o.M [facility] last [facility] 03/14/20 at shelter.  At 02/29/20 Shelter visit: This is a 70 year old male new to the shelter for 1 month first time I have seen the patient in the shelter clinic he comes in complaining of heartburn and reflux symptoms and heavy ethanol use he claims he did has had a couple of drinks of beer for the last few days but largely has quit.  He has previous history of bypass surgery in 2017 he is not on any medications whatsoever he is not on blood pressure medicines denies previous history of hypertension or stroke also should have history of type 2 diabetes but not on therapy   On exam blood pressure 96/60 pulse 70 saturation 96% room air blood sugar was 107 patient has few scattered rales at the bases distant breath sounds card exam was unremarkable   Impression prior history of cocaine use none for a year history of heavy ethanol use severe reflux symptoms history of coronary disease without active treatment for this   Plan is to get this patient into the office I can obtain labs on this patient full physical and then at that point can get him into cardiology I gave him samples of omeprazole 20 mg daily given 21 capsules to take for the reflux we will endeavor to get him in the next few weeks at [facility]  At 03/14/20 shelter visit: 70 year old [facility] he has history of type 2 diabetes question of hypertension previous coronary disease with bypass surgery he has an existing appointment January 11 in my clinic today he is requesting refills on his sleeping medicine atorvastatin Depakote and aspirin   On exam blood sugar is 114 remaining part of the exam is unremarkable impression is that of coronary disease for this with no refills on the [place] atorvastatin and aspirin   History of bipolar disorder and  schizophrenia patient will have refills on Depakote and trazodone all his medications sent to his [facility] and he has been reminded of his upcoming appoint with me on January 11  03/20/2020 The patient comes in today with need to establish for primary care [facility]  Patient with history of hypertension, coronary disease, chronic coolness of the right hand, bilateral lower extremity pain right worse than left worse with ambulation, previous bypass surgery, hyperlipidemia, history of schizophrenia.  Also history of COPD documented in the history along with reflux disease Note the patient has had 1 COVID-vaccine [employer] in November.  He is due a pneumonia vaccine Tdap and flu vaccine he accepts these at this visit.  On arrival blood pressure is elevated at 142/83.  On review of the chart he has been on metoprolol and lisinopril in the past but is not on blood pressure medications currently.  He has been taking over-the-counter proton pump inhibitor.  We did give him prescribed atorvastatin 80 mg daily and trazodone at bedtime to help sleep.  He has been on Seroquel in the past.  Formally was on Depakote but is now off the Depakote  He has yet to connect with a mental health provider  He has not seen cardiology in many years and has had bypass surgery.  Patient denies any current chest pain but does have a productive cough.  There is a history of type 2 diabetes but is not on any medications at this time and on arrival his A1c is greater than 6  08/16/2020 Patient presents today for a follow up visit regarding his hypertension, diabetes, bipolar disorder, and COPD. He reports he has not taken any of his medications in the past month. He states he usually gets his medications delivered but has not received these deliveries over the past month since starting medicare. The patient's blood pressure today is 102/62. His blood sugar is 104 with an A1c  of 5.9%. He denies any recent mental health symptoms. He reports he has been having some shortness of breath and wheezing since he has not been using his inhalers. The patient also reports he continues to smoke about 5 cigarettes daily but denies any cocaine use since March.   The patient also had an ischemic stroke on 05/11/2020 and since then has been experiencing right sided weakness and difficulty ambulating. The patient denies ever being referred to physical therapy after the stroke.    DC summary as below: Admit date: 05/11/2020 Discharge date: 05/13/2020   Admission Diagnoses:   Discharge Diagnoses:  Active Problems:   Stroke [facility])     Discharged Condition: stable   Hospital Course: [name] is a 70 y.o. male with history of cocaine abuse (UDS + this admit) presenting as code stroke on 3/4 with NIHSS4. He was outside time window for tPA. CTA showed prox M2 occlusion with multiple LMCA vessel atherosclerosis. NIR intervention was considered, but since his symptoms resolved, no intervention done. He was admitted to ICU for close 24h monitoring and plan call NIR if any return of stroke symptoms. BP and neuro exam have remained stable. Rehab team evals and stroke wk up have been completed.  Diagnosis: Stroke: scattered left CR infarcts, secondary to large vessel disease with left M2 occlusion  CT head: ASPECTS 9. R PLIC and pontine old strokes. Atrophy.  CTA head and neck left M2 occlusion, right PCA occlusion bilateral ICA siphon moderate to severe stenosis  MRI  Scattered infarcts in Left CR  2D Echo 60% EF, wnl  LDL 118  HgbA1c 6.0  UDS positive for cocaine  VTE prophylaxis - SCDs  ASA  prior to admission, now on aspirin 325 and Plavix 75 DAPT for 3 months and then Plavix alone given intracranial large vessel occlusion.  Therapy recommendations:  Out pt pt/ot  Disposition: home today w/HH; pt does not drive, cant get to freq out pt PT/OT    Hypertension  Home meds: Pt tells me he knows he is supposed to take BP meds, but has been non-compliant. I will place Rx for home meds per his request as he has none at home, but he needs to f/u with his PCP in next 1-2 weeks for ongoing mgt.  Stable  Long-term BP goal 130-150 given large vessel occlusion   Hyperlipidemia  Home meds: Lipitor  resumed in hospital  LDL 118, goal < 70  High intensity statin  Continue statin at discharge   Diabetes type II Controlled  Home meds: metfomal  HgbA1c 6.0, goal < 7.0  CBGs  SSI  Close PCP follow-up   Cocaine abuse  UDS positive for cocaine  Cessation education provided  Patient is willing to quit; SW consult placed   Tobacco abuse  Current smoker  Smoking cessation counseling provided  Pt is willing to quit   Other Stroke Risk Factors  Advanced Age >/= 68  ETOH use, alcohol level <10, advised to drink no more than 1 drink(s) a day  Hx stroke/TIA on imaging and 20 years ago per chart  Coronary artery disease status post CABG  PVD   Other Active Problems  Medication non-compliance- counseled on this  Schizophrenia  Bipolar disorder     Consults: Rehab team    Past Medical History:  Diagnosis Date   Acid reflux    Anemia    Arthritis    hip, right   Bipolar 1 disorder (HCC) 03/20/2020   CAD (coronary artery disease)    Chest pain    Chronic back pain    Cocaine abuse (HCC) 10/16/2012   COPD (chronic obstructive pulmonary disease) (HCC)    COPD with chronic bronchitis (HCC) 03/20/2020   Coronary artery disease 06/26/2015   Diabetes (HCC)    Disorganized schizophrenia (HCC) 06/16/2018   Erectile dysfunction    Essential hypertension 03/20/2020   Gastro-esophageal reflux disease without esophagitis 03/20/2020   History of non-ST elevation myocardial infarction (NSTEMI) 06/26/2015   06/01/2015 in [place]    Hyperlipidemia    Hypertension    Lives in homeless shelter 03/20/2020   Pain  in joint, pelvic region and thigh 03/20/2020   Peripheral vascular disease (HCC)    right leg stays cold   Pneumonia    Psychosis (HCC) 10/16/2012   S/P CABG x 3 06/29/2015   Tobacco user 03/20/2020     Family History  Problem Relation Age of Onset   Cancer Mother        stomach   Heart disease Father      Social History   Socioeconomic History   Marital status: Single    Spouse name: Not on file   Number of children: Not on file   Years of education: Not on file   Highest education level: Not on file  Occupational History   Not on file  Tobacco Use   Smoking status: Every Day    Packs/day: 0.25    Years: 40.00    Pack years: 10.00    Types: Cigarettes   Smokeless tobacco: Never  Vaping Use   Vaping Use: Never used  Substance and Sexual Activity   Alcohol use: Not Currently    Comment: 1/5 per day - Pt states he may only have one or 2 drinks if he drinks any (noted 06/27/15)   Drug use: Not Currently    Types: "Crack" cocaine, Cocaine    Comment: yesterday    Sexual activity: Yes    Birth control/protection: Condom  Other Topics Concern   Not on file  Social History Narrative   Not on file   Social Determinants of Health   Financial Resource Strain: Not on file  Food Insecurity: Not on file  Transportation Needs: Not on file  Physical Activity: Not on file  Stress: Not on file  Social Connections: Not on file  Intimate Partner Violence: Not on file     No Known Allergies   Outpatient Medications Prior to Visit  Medication Sig Dispense Refill   albuterol (VENTOLIN HFA) 108 (90 Base) MCG/ACT inhaler Inhale 2 puffs into the lungs every 6 (six) hours as needed for wheezing or shortness of breath. (Patient not taking: No sig reported) 18 g 2   atorvastatin (LIPITOR) 80 MG tablet Take 1 tablet (80 mg total) by mouth daily at 6 PM. 90 tablet 1   budesonide-formoterol (SYMBICORT) 160-4.5 MCG/ACT inhaler Inhale 2 puffs into the lungs 2 (two) times  daily. (Patient not  taking: No sig reported) 1 each 12   carvedilol (COREG) 12.5 MG tablet Take 1 tablet (12.5 mg total) by mouth 2 (two) times daily with a meal. 180 tablet 3   clopidogrel (PLAVIX) 75 MG tablet Take 1 tablet (75 mg total) by mouth daily. Take with ASA for 3 months, then take plavix alone (Patient not taking: No sig reported) 90 tablet 3   cyclobenzaprine (FLEXERIL) 5 MG tablet Take 1 tablet (5 mg total) by mouth 3 (three) times daily. (Patient not taking: No sig reported) 90 tablet 1   fluticasone (FLONASE) 50 MCG/ACT nasal spray USE 2 SPRAYS in each nostril EVERY DAY (Patient not taking: No sig reported) 16 g 2   lisinopril (ZESTRIL) 10 MG tablet Take 1 tablet (10 mg total) by mouth daily. 90 tablet 3   metFORMIN (GLUCOPHAGE) 500 MG tablet Take 1 tablet (500 mg total) by mouth 2 (two) times daily with a meal. (Patient not taking: No sig reported) 180 tablet 3   pantoprazole (PROTONIX) 40 MG tablet TAKE 1 TABLET BY MOUTH EVERY DAY WITH BREAKFAST (Patient not taking: No sig reported) 40 tablet 1   QUEtiapine (SEROQUEL) 100 MG tablet Take 1 tablet (100 mg total) by mouth at bedtime. (Patient not taking: No sig reported) 30 tablet 3   QUEtiapine (SEROQUEL) 50 MG tablet Take 50 mg by mouth at bedtime. (Patient not taking: No sig reported)     traZODone (DESYREL) 100 MG tablet TAKE 1 TABLET BY MOUTH AT BEDTIME AS NEEDED FOR SLEEP. (Patient not taking: No sig reported) 90 tablet 1   No facility-administered medications prior to visit.      Review of Systems  Constitutional:  Negative for fever.  HENT:  Negative for congestion, ear pain and sore throat.   Eyes:  Negative for pain.  Respiratory:  Positive for shortness of breath and wheezing.   Cardiovascular: Negative.        Claudication RLE  Gastrointestinal: Negative.  Negative for abdominal pain, diarrhea, nausea and vomiting.  Genitourinary: Negative.   Musculoskeletal: Negative.   Skin:        Dry skin  Neurological:  Positive for weakness.  Negative for headaches.       Weak in both arms      Objective:   Physical Exam Constitutional:      General: He is not in acute distress. HENT:     Head: Normocephalic and atraumatic.     Mouth/Throat:     Mouth: Mucous membranes are moist.     Comments: Missing several teeth Eyes:     Conjunctiva/sclera: Conjunctivae normal.  Cardiovascular:     Rate and Rhythm: Normal rate and regular rhythm.     Pulses: Normal pulses.          Dorsalis pedis pulses are 2+ on the right side and 2+ on the left side.       Posterior tibial pulses are 2+ on the right side and 2+ on the left side.     Heart sounds: Normal heart sounds. No murmur heard.   No friction rub. No gallop.  Pulmonary:     Effort: Pulmonary effort is normal.     Breath sounds: Wheezing (Diffuse expiratory wheezes in anterior lung fields) present.  Abdominal:     General: There is no distension.     Palpations: Abdomen is soft.     Tenderness: There is no abdominal tenderness.  Musculoskeletal:        General:  No swelling.     Cervical back: Normal range of motion.  Feet:     Right foot:     Skin integrity: Callus (Right fifth toe) and dry skin present.     Toenail Condition: Right toenails are abnormally thick and long.     Left foot:     Skin integrity: Dry skin present.     Toenail Condition: Left toenails are abnormally thick and long.  Skin:    General: Skin is warm.     Comments: Dry skin noted diffusely on feet bilaterally Callous noted on the anterior aspect of the right 5th toe  Neurological:     Mental Status: He is alert and oriented to person, place, and time.     Motor: Weakness (Right sided weakness. Right upper extremity weakness greater than right lower extremity weakness) present.     Gait: Gait abnormal.     Comments: Right sided weakness with grip strength   Psychiatric:        Mood and Affect: Mood normal.        Behavior: Behavior normal.   Vitals:   08/16/20 1056 08/16/20 1106  BP: (!)  86/52 102/62  Pulse: 63 69  Resp: 16   SpO2: 98%   Weight: 152 lb (68.9 kg)        Assessment & Plan:  I personally reviewed all images and lab data in the [facility] system as well as any outside material available during this office visit and agree with the  radiology impressions.   Coronary artery disease Take Aspirin 325 mg daily for one month. Take Plavix 75 mg daily.  Follow up at next appointment  Essential hypertension Take carvedilol 12.5 mg twice  daily. Follow up at next appointment   COPD with chronic bronchitis (HCC) Encouraged smoking cessation. Take Symbicort two puffs twice daily. Use albuterol inhaler as needed. Follow up at next appointment  Gastro-esophageal reflux disease without esophagitis Take Protonix 40 mg daily. Follow up at next appointment.  Type 2 diabetes mellitus without complications (HCC) Advised to check blood sugars regularly at home. Take metformin 500 mg daily. Follow up at next appointment   Hyperlipidemia Take Lipitor 80 mg daily. Follow up at next appointment.  Bipolar 1 disorder (HCC) Take Seroquel 100 mg daily. Take Trazadone 100 mg at bedtime as needed. Follow up at next appointment.  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  Pt never had PT after hosp d/c  Ref to PT   Cocaine abuse (HCC) Pt says no cocaine since 05/2020  observe  History of stroke Resume ASA x one month Resume plavis daily  Disorganized schizophrenia (HCC) Resume seroquel and trazadone  Tobacco user Ongoing tobacco use counseling given     Current smoking consumption amount: 1 pack a day  Dicsussion on advise to quit smoking and smoking impacts: Cardiovascular lung impacts  Patient's willingness to quit: Not yet ready  Methods to quit smoking discussed: Nicotine replacement  Medication management of smoking session drugs discussed: Nicotine replacement however not indicated due to vascular  disease  Resources provided:  AVS   Setting quit date not established Follow-up arranged 1 month   Time spent counseling the patient: 5 minutes   Diagnoses and all orders for this visit:  Type 2 diabetes mellitus without complication, without long-term current use of insulin (HCC) -     Glucose (CBG) -     HgB A1c -  Comprehensive metabolic panel -     Lipid panel  COPD with chronic bronchitis (HCC) -     budesonide-formoterol (SYMBICORT) 160-4.5 MCG/ACT inhaler; Inhale 2 puffs into the lungs 2 (two) times daily.  Essential hypertension -     carvedilol (COREG) 12.5 MG tablet; Take 1 tablet (12.5 mg total) by mouth 2 (two) times daily with a meal. -     CBC with Differential/Platelet  Gastro-esophageal reflux disease without esophagitis -     pantoprazole (PROTONIX) 40 MG tablet; TAKE 1 TABLET BY MOUTH EVERY DAY WITH BREAKFAST  Bipolar 1 disorder (HCC) -     QUEtiapine (SEROQUEL) 100 MG tablet; Take 1 tablet (100 mg total) by mouth at bedtime.  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 -     atorvastatin (LIPITOR) 80 MG tablet; Take 1 tablet (80 mg total) by mouth daily at 6 PM. -     clopidogrel (PLAVIX) 75 MG tablet; Take 1 tablet (75 mg total) by mouth daily. Take with aspirin for 1 month, then take plavix alone -     metFORMIN (GLUCOPHAGE) 500 MG tablet; Take 1 tablet (500 mg total) by mouth daily with  breakfast. -     traZODone (DESYREL) 100 MG tablet; Take 1 tablet (100 mg total) by mouth at bedtime as needed. for sleep -     aspirin EC 325 MG tablet; Take 1 tablet (325 mg total) by mouth daily. For one month then stop

## 2020-08-16 NOTE — Progress Notes (Signed)
HFU-   States he had a fall some weeks ago.  States he is not taking medication  [facility]   There are refills on Plavix, carvedilol, lisinopril, and metformin.   Will need refills on trazodone, atorvastatin, and flonase.

## 2020-08-16 NOTE — Assessment & Plan Note (Signed)
Advised to check blood sugars regularly at home. Take metformin 500 mg daily. Follow up at next appointment

## 2020-08-16 NOTE — Assessment & Plan Note (Addendum)
Encouraged smoking cessation. Take Symbicort two puffs twice daily. Use albuterol inhaler as needed. Follow up at next appointment

## 2020-08-16 NOTE — Patient Instructions (Addendum)
Refills on all medications sent to your pharmacy they will be delivering this to you  Resume aspirin 1 daily for 1 month then stop Resume clopidogrel daily you will take this for life Resume atorvastatin 80 mg daily Resume your Symbicort and albuterol inhalers Resume Coreg 1 tablet twice daily Reduce metformin to 500 mg daily Resume pantoprazole 1 daily  resume Seroquel and trazodone at bedtime Discontinue lisinopril  Complete set of labs were obtained today  Referral to a foot doctor is made they will call you for an appointment Referral to physical therapy is made they will call you for the appointment Please use your Medicaid transportation option to get transportation to these appointments  Return to see Dr. Delford Field 2 months  Please obtain an eye exam use the eye resource sheet we gave you to find an appointment all of those doctors except your Medicaid Medicare

## 2020-08-17 ENCOUNTER — Telehealth: Payer: Self-pay

## 2020-08-17 ENCOUNTER — Telehealth: Payer: Self-pay | Admitting: *Deleted

## 2020-08-17 DIAGNOSIS — I2510 Atherosclerotic heart disease of native coronary artery without angina pectoris: Secondary | ICD-10-CM

## 2020-08-17 DIAGNOSIS — Z79899 Other long term (current) drug therapy: Secondary | ICD-10-CM

## 2020-08-17 DIAGNOSIS — E7800 Pure hypercholesterolemia, unspecified: Secondary | ICD-10-CM

## 2020-08-17 DIAGNOSIS — E782 Mixed hyperlipidemia: Secondary | ICD-10-CM

## 2020-08-17 LAB — COMPREHENSIVE METABOLIC PANEL
ALT: 15 IU/L (ref 0–44)
AST: 19 IU/L (ref 0–40)
Albumin/Globulin Ratio: 1.8 (ref 1.2–2.2)
Albumin: 4.1 g/dL (ref 3.8–4.8)
Alkaline Phosphatase: 143 IU/L — ABNORMAL HIGH (ref 44–121)
BUN/Creatinine Ratio: 9 — ABNORMAL LOW (ref 10–24)
BUN: 6 mg/dL — ABNORMAL LOW (ref 8–27)
Bilirubin Total: 0.8 mg/dL (ref 0.0–1.2)
CO2: 23 mmol/L (ref 20–29)
Calcium: 9.2 mg/dL (ref 8.6–10.2)
Chloride: 102 mmol/L (ref 96–106)
Creatinine, Ser: 0.68 mg/dL — ABNORMAL LOW (ref 0.76–1.27)
Globulin, Total: 2.3 g/dL (ref 1.5–4.5)
Glucose: 88 mg/dL (ref 65–99)
Potassium: 3.8 mmol/L (ref 3.5–5.2)
Sodium: 140 mmol/L (ref 134–144)
Total Protein: 6.4 g/dL (ref 6.0–8.5)
eGFR: 101 mL/min/{1.73_m2} (ref >59)

## 2020-08-17 LAB — LIPID PANEL
Chol/HDL Ratio: 4.1 ratio (ref 0.0–5.0)
Cholesterol, Total: 145 mg/dL (ref 100–199)
HDL: 35 mg/dL — ABNORMAL LOW (ref >39)
LDL Chol Calc (NIH): 92 mg/dL (ref 0–99)
Triglycerides: 98 mg/dL (ref 0–149)
VLDL Cholesterol Cal: 18 mg/dL (ref 5–40)

## 2020-08-17 LAB — CBC WITH DIFFERENTIAL/PLATELET
Basophils Absolute: 0 10*3/uL (ref 0.0–0.2)
Basos: 0 %
EOS (ABSOLUTE): 0.2 10*3/uL (ref 0.0–0.4)
Eos: 2 %
Hematocrit: 42.7 % (ref 37.5–51.0)
Hemoglobin: 14.4 g/dL (ref 13.0–17.7)
Immature Grans (Abs): 0 10*3/uL (ref 0.0–0.1)
Immature Granulocytes: 0 %
Lymphocytes Absolute: 2.1 10*3/uL (ref 0.7–3.1)
Lymphs: 23 %
MCH: 28.5 pg (ref 26.6–33.0)
MCHC: 33.7 g/dL (ref 31.5–35.7)
MCV: 85 fL (ref 79–97)
Monocytes Absolute: 0.7 10*3/uL (ref 0.1–0.9)
Monocytes: 7 %
Neutrophils Absolute: 6.1 10*3/uL (ref 1.4–7.0)
Neutrophils: 68 %
Platelets: 234 10*3/uL (ref 150–450)
RBC: 5.05 x10E6/uL (ref 4.14–5.80)
RDW: 13.8 % (ref 11.6–15.4)
WBC: 9.1 10*3/uL (ref 3.4–10.8)

## 2020-08-17 MED ORDER — EZETIMIBE 10 MG PO TABS: 10.0000 mg | ORAL_TABLET | Freq: Every day | ORAL | 1 refills | Status: DC

## 2020-08-17 NOTE — Telephone Encounter (Signed)
Patient name and DOB has been verified Patient was informed of lab results. Patient had no questions.  

## 2020-08-17 NOTE — Telephone Encounter (Signed)
-----   Message from Meriam Sprague, MD sent at 08/17/2020  8:51 AM EDT ----- Cholesterol looks much better on the lipitor but still not at goal. Can we start him on zetia 10mg  daily. His goal LDL<70. We can repeat cholesterol in 6-8 weeks.

## 2020-08-17 NOTE — Telephone Encounter (Signed)
Pt aware of lab results and recommendations per Dr. Shari Prows.  Pt agreed to start taking zetia 10 mg po daily, along with his other meds.  Confirmed the pharmacy of choice with the pt.  Scheduled the pt to come in for repeat lipids in 8 weeks on 10/12/20.  He is aware to come fasting to this lab appt.  Pt verbalized understanding and agrees with this plan.

## 2020-08-17 NOTE — Telephone Encounter (Signed)
-----   Message from Storm Frisk, MD sent at 08/17/2020  7:14 AM EDT ----- Let pt know liver kidney blood counts normal   cholesterol at goal

## 2020-08-22 ENCOUNTER — Telehealth: Payer: Self-pay

## 2020-08-22 ENCOUNTER — Inpatient Hospital Stay: Payer: 59 | Admitting: Neurology

## 2020-08-22 ENCOUNTER — Encounter: Payer: Self-pay | Admitting: Neurology

## 2020-08-22 NOTE — Telephone Encounter (Signed)
Attempted to contact the patient [phone] to inquire if he has registered for transportation with Medicaid and [employer]. Unable to leave a message, voicemail full.

## 2020-08-27 ENCOUNTER — Ambulatory Visit (INDEPENDENT_AMBULATORY_CARE_PROVIDER_SITE_OTHER): Payer: 59 | Admitting: Podiatry

## 2020-08-27 ENCOUNTER — Other Ambulatory Visit: Payer: Self-pay

## 2020-08-27 DIAGNOSIS — Z8673 Personal history of transient ischemic attack (TIA), and cerebral infarction without residual deficits: Secondary | ICD-10-CM | POA: Diagnosis not present

## 2020-08-27 DIAGNOSIS — B351 Tinea unguium: Secondary | ICD-10-CM | POA: Diagnosis not present

## 2020-08-27 DIAGNOSIS — M79674 Pain in right toe(s): Secondary | ICD-10-CM | POA: Diagnosis not present

## 2020-08-27 DIAGNOSIS — M79675 Pain in left toe(s): Secondary | ICD-10-CM | POA: Diagnosis not present

## 2020-08-27 NOTE — Progress Notes (Signed)
   SUBJECTIVE Patient presents to office today complaining of elongated, thickened nails that cause pain while ambulating in shoes.  He is unable to trim his own nails secondary to PMHx stroke. Patient is here for further evaluation and treatment.  Past Medical History:  Diagnosis Date   Acid reflux    Anemia    Arthritis    hip, right   Bipolar 1 disorder (HCC) 03/20/2020   CAD (coronary artery disease)    Chest pain    Chronic back pain    Cocaine abuse (HCC) 10/16/2012   COPD (chronic obstructive pulmonary disease) (HCC)    COPD with chronic bronchitis (HCC) 03/20/2020   Coronary artery disease 06/26/2015   Diabetes (HCC)    Disorganized schizophrenia (HCC) 06/16/2018   Erectile dysfunction    Essential hypertension 03/20/2020   Gastro-esophageal reflux disease without esophagitis 03/20/2020   History of non-ST elevation myocardial infarction (NSTEMI) 06/26/2015   06/01/2015 in [place]    Hyperlipidemia    Hypertension    Lives in homeless shelter 03/20/2020   Pain in joint, pelvic region and thigh 03/20/2020   Peripheral vascular disease (HCC)    right leg stays cold   Pneumonia    Psychosis (HCC) 10/16/2012   S/P CABG x 3 06/29/2015   Tobacco user 03/20/2020    OBJECTIVE General Patient is awake, alert, and oriented x 3 and in no acute distress. Derm Skin is dry and supple bilateral. Negative open lesions or macerations. Remaining integument unremarkable. Nails are tender, long, thickened and dystrophic with subungual debris, consistent with onychomycosis, 1-5 bilateral. No signs of infection noted. Vasc  DP and PT pedal pulses palpable bilaterally. Temperature gradient within normal limits.  Neuro Epicritic and protective threshold sensation grossly intact bilaterally.  Musculoskeletal Exam No symptomatic pedal deformities noted bilateral. Muscular strength within normal limits.  ASSESSMENT 1. Onychodystrophic nails 1-5 bilateral with hyperkeratosis of nails.  2.  Onychomycosis of nail due to dermatophyte bilateral 3. Pain in foot bilateral  PLAN OF CARE 1. Patient evaluated today.  2. Instructed to maintain good pedal hygiene and foot care.  3. Mechanical debridement of nails 1-5 bilaterally performed using a nail nipper. Filed with dremel without incident.  4. Return to clinic in 3 mos.    Felecia Shelling, DPM [facility]  Dr. Felecia Shelling, DPM    [address] [place], [place] [address]                Office [phone]  Fax [phone]

## 2020-09-04 ENCOUNTER — Encounter: Payer: Self-pay | Admitting: Critical Care Medicine

## 2020-10-12 ENCOUNTER — Other Ambulatory Visit: Payer: 59

## 2020-11-13 ENCOUNTER — Other Ambulatory Visit: Payer: Self-pay | Admitting: Critical Care Medicine

## 2020-11-13 DIAGNOSIS — J449 Chronic obstructive pulmonary disease, unspecified: Secondary | ICD-10-CM

## 2020-11-13 NOTE — Telephone Encounter (Signed)
Requested medications are due for refill today.  unknown  Requested medications are on the active medications list.  no  Last refill. 05/15/2020  Future visit scheduled.   no  Notes to clinic.  Medication was discontinued 08/16/2020 at [facility] when pt sated he was not taking this medication.

## 2020-11-14 ENCOUNTER — Other Ambulatory Visit: Payer: Self-pay | Admitting: Critical Care Medicine

## 2020-11-14 DIAGNOSIS — J449 Chronic obstructive pulmonary disease, unspecified: Secondary | ICD-10-CM

## 2020-11-15 NOTE — Telephone Encounter (Signed)
Already signed 11/14/20 requested by interface surescripts

## 2020-12-03 ENCOUNTER — Ambulatory Visit: Payer: Medicare Other | Admitting: Podiatry

## 2021-04-04 DIAGNOSIS — Z1152 Encounter for screening for COVID-19: Secondary | ICD-10-CM | POA: Diagnosis not present

## 2021-04-22 ENCOUNTER — Other Ambulatory Visit: Payer: Self-pay | Admitting: Cardiology

## 2021-04-22 DIAGNOSIS — E7800 Pure hypercholesterolemia, unspecified: Secondary | ICD-10-CM

## 2021-04-22 DIAGNOSIS — I2510 Atherosclerotic heart disease of native coronary artery without angina pectoris: Secondary | ICD-10-CM

## 2021-05-07 DIAGNOSIS — Z1152 Encounter for screening for COVID-19: Secondary | ICD-10-CM | POA: Diagnosis not present

## 2021-05-09 DIAGNOSIS — Z20822 Contact with and (suspected) exposure to covid-19: Secondary | ICD-10-CM | POA: Diagnosis not present

## 2021-06-11 DIAGNOSIS — Z1152 Encounter for screening for COVID-19: Secondary | ICD-10-CM | POA: Diagnosis not present

## 2021-06-19 DIAGNOSIS — Z1152 Encounter for screening for COVID-19: Secondary | ICD-10-CM | POA: Diagnosis not present

## 2021-06-24 DIAGNOSIS — Z1152 Encounter for screening for COVID-19: Secondary | ICD-10-CM | POA: Diagnosis not present

## 2021-06-26 DIAGNOSIS — Z1152 Encounter for screening for COVID-19: Secondary | ICD-10-CM | POA: Diagnosis not present

## 2021-07-03 DIAGNOSIS — Z1152 Encounter for screening for COVID-19: Secondary | ICD-10-CM | POA: Diagnosis not present

## 2021-07-04 DIAGNOSIS — Z20822 Contact with and (suspected) exposure to covid-19: Secondary | ICD-10-CM | POA: Diagnosis not present

## 2021-07-11 DIAGNOSIS — Z20822 Contact with and (suspected) exposure to covid-19: Secondary | ICD-10-CM | POA: Diagnosis not present

## 2021-07-30 DIAGNOSIS — Z1152 Encounter for screening for COVID-19: Secondary | ICD-10-CM | POA: Diagnosis not present

## 2021-07-31 DIAGNOSIS — Z1152 Encounter for screening for COVID-19: Secondary | ICD-10-CM | POA: Diagnosis not present

## 2021-08-07 DIAGNOSIS — Z1152 Encounter for screening for COVID-19: Secondary | ICD-10-CM | POA: Diagnosis not present

## 2021-08-14 ENCOUNTER — Encounter (HOSPITAL_COMMUNITY): Payer: Self-pay

## 2021-08-14 ENCOUNTER — Emergency Department (HOSPITAL_COMMUNITY)
Admission: EM | Admit: 2021-08-14 | Discharge: 2021-08-15 | Disposition: A | Discharge status: Home / Self Care | Payer: Medicare Other | Attending: Emergency Medicine | Admitting: Emergency Medicine

## 2021-08-14 ENCOUNTER — Other Ambulatory Visit: Payer: Self-pay

## 2021-08-14 DIAGNOSIS — I10 Essential (primary) hypertension: Secondary | ICD-10-CM | POA: Insufficient documentation

## 2021-08-14 DIAGNOSIS — Z955 Presence of coronary angioplasty implant and graft: Secondary | ICD-10-CM | POA: Insufficient documentation

## 2021-08-14 DIAGNOSIS — Z5900 Homelessness unspecified: Secondary | ICD-10-CM | POA: Diagnosis not present

## 2021-08-14 DIAGNOSIS — K029 Dental caries, unspecified: Secondary | ICD-10-CM | POA: Diagnosis not present

## 2021-08-14 DIAGNOSIS — R6889 Other general symptoms and signs: Secondary | ICD-10-CM | POA: Diagnosis not present

## 2021-08-14 DIAGNOSIS — Z7951 Long term (current) use of inhaled steroids: Secondary | ICD-10-CM | POA: Diagnosis not present

## 2021-08-14 DIAGNOSIS — Z743 Need for continuous supervision: Secondary | ICD-10-CM | POA: Diagnosis not present

## 2021-08-14 DIAGNOSIS — Z7982 Long term (current) use of aspirin: Secondary | ICD-10-CM | POA: Insufficient documentation

## 2021-08-14 DIAGNOSIS — J449 Chronic obstructive pulmonary disease, unspecified: Secondary | ICD-10-CM | POA: Insufficient documentation

## 2021-08-14 DIAGNOSIS — K0889 Other specified disorders of teeth and supporting structures: Secondary | ICD-10-CM | POA: Diagnosis not present

## 2021-08-14 DIAGNOSIS — I2510 Atherosclerotic heart disease of native coronary artery without angina pectoris: Secondary | ICD-10-CM | POA: Diagnosis not present

## 2021-08-14 DIAGNOSIS — Z79899 Other long term (current) drug therapy: Secondary | ICD-10-CM | POA: Diagnosis not present

## 2021-08-14 MED ORDER — KETOROLAC TROMETHAMINE 15 MG/ML IJ SOLN
15.0000 mg | Freq: Once | INTRAMUSCULAR | Status: AC
  Administered 2021-08-14: 15 mg via INTRAMUSCULAR
  Filled 2021-08-14 (×2): qty 1

## 2021-08-14 NOTE — ED Triage Notes (Signed)
Arrives EMS with c/o dental pain. Had 8 teeth removed and was not given any medication for pain control.

## 2021-08-14 NOTE — ED Provider Triage Note (Addendum)
Emergency Medicine Provider Triage Evaluation Note  Andre Rojas , a 71 y.o. male  was evaluated in triage.  Pt complains of dental pain.  Patient had 8 teeth pulled earlier this morning, having significant pain.  Has not tried anything for pain.  Not on blood thinners.  Review of Systems  Per HPI  Physical Exam  BP (!) 203/123 (BP Location: Right Arm)   Pulse 82   Temp 98 F (36.7 C)   Resp 17   Ht 5\' 10"  (1.778 m)   Wt 70.3 kg   SpO2 98%   BMI 22.24 kg/m  Gen:   Awake, no distress   Resp:  Normal effort  MSK:   Moves extremities without difficulty  Other:  No sublingual tenderness or swelling.  Multiple recent extraction sites in the jaw, no exudate  Medical Decision Making  Medically screening exam initiated at 7:31 PM.  Appropriate orders placed.  Andre Rojas was informed that the remainder of the evaluation will be completed by another provider, this initial triage assessment does not replace that evaluation, and the importance of remaining in the ED until their evaluation is complete.  I ordered a shot of Toradol for the patient's pain.  Very hypertensive in triage but not septic.  Not on blood thinners, recent extraction.  Do not think labs indicated.   Sherrill Raring, PA-C 08/14/21 1931    Sherrill Raring, PA-C 08/14/21 1932

## 2021-08-15 DIAGNOSIS — K0889 Other specified disorders of teeth and supporting structures: Secondary | ICD-10-CM | POA: Diagnosis not present

## 2021-08-15 MED ORDER — OXYCODONE-ACETAMINOPHEN 5-325 MG PO TABS: 1.0000 | ORAL_TABLET | Freq: Four times a day (QID) | ORAL | 0 refills | Status: DC | PRN

## 2021-08-15 MED ORDER — OXYCODONE-ACETAMINOPHEN 5-325 MG PO TABS
1.0000 | ORAL_TABLET | Freq: Once | ORAL | Status: AC
  Administered 2021-08-15: 1 via ORAL
  Filled 2021-08-15: qty 1

## 2021-08-15 NOTE — Discharge Instructions (Signed)
You are seen in the ER today for your dental pain.  Your mouth appears to be healing well.  You have been given some pain medication which may take as needed for severe pain.  May use ibuprofen or Tylenol in the interim.  Follow-up with your dentist as previously scheduled and return to the ER with any new severe symptoms.

## 2021-08-15 NOTE — ED Provider Notes (Signed)
[facility]   CSN: [id number] Arrival date & time: 08/14/21  1922     History  Chief Complaint  Patient presents with   Dental Pain    Andre GasserLarry E Koplin is a 71 y.o. male who presents via EMS with concern for dental pain.  Patient is homeless and had 8 teeth removed yesterday.  States that they did not give him any pain medication and he does not have any of his own.  Presents requesting place to sleep and medication for pain. NO fevers, chills, N/V. Tolerating PO.   I have personally reviewed this patient's medical records. Hx of homelessness, CAD s/p CABG, polysubstance abuse, bipolar, HTN, COPD.   HPI     Home Medications Prior to Admission medications   Medication Sig Start Date End Date Taking? Authorizing Provider  oxyCODONE-acetaminophen (PERCOCET/ROXICET) 5-325 MG tablet Take 1 tablet by mouth every 6 (six) hours as needed for severe pain. 08/15/21  Yes Kaytlen Lightsey R, PA-C  albuterol (VENTOLIN HFA) 108 (90 Base) MCG/ACT inhaler Inhale 2 puffs into the lungs every 6 (six) hours as needed for wheezing or shortness of breath. 08/16/20   Storm FriskWright, Patrick E, MD  aspirin EC 325 MG tablet Take 1 tablet (325 mg total) by mouth daily. For one month then stop 08/16/20   Storm FriskWright, Patrick E, MD  atorvastatin (LIPITOR) 80 MG tablet Take 1 tablet (80 mg total) by mouth daily at 6 PM. 08/16/20 09/15/20  Storm FriskWright, Patrick E, MD  budesonide-formoterol [facility](SYMBICORT) 160-4.5 MCG/ACT inhaler Inhale 2 puffs into the lungs 2 (two) times daily. 08/16/20 08/16/21  Storm FriskWright, Patrick E, MD  carvedilol (COREG) 12.5 MG tablet Take 1 tablet (12.5 mg total) by mouth 2 (two) times daily with a meal. 08/16/20 09/15/20  Storm FriskWright, Patrick E, MD  clopidogrel (PLAVIX) 75 MG tablet Take 1 tablet (75 mg total) by mouth daily. Take with aspirin for 1 month, then take plavix alone 08/16/20   Storm FriskWright, Patrick E, MD  ezetimibe (ZETIA) 10 MG tablet Take 1 tablet (10 mg total) by mouth daily.  08/17/20   Meriam SpraguePemberton, Heather E, MD  fluticasone Aleda Grana(FLONASE) 50 MCG/ACT nasal spray USE 2 SPRAYS IN [facility]IL EVERY DAY 11/14/20   Storm FriskWright, Patrick E, MD  metFORMIN (GLUCOPHAGE) 500 MG tablet Take 1 tablet (500 mg total) by mouth daily with breakfast. 08/16/20   Storm FriskWright, Patrick E, MD  pantoprazole (PROTONIX) 40 MG tablet TAKE 1 TABLET BY MOUTH EVERY DAY WITH BREAKFAST 08/16/20   Storm FriskWright, Patrick E, MD  QUEtiapine (SEROQUEL) 100 MG tablet Take 1 tablet (100 mg total) by mouth at bedtime. 08/16/20   Storm FriskWright, Patrick E, MD  traZODone (DESYREL) 100 MG tablet Take 1 tablet (100 mg total) by mouth at bedtime as needed. for sleep 08/16/20   Storm FriskWright, Patrick E, MD      Allergies    Patient has no known allergies.    Review of Systems   Review of Systems  Constitutional: Negative.   HENT:  Positive for dental problem.   Respiratory: Negative.    Cardiovascular: Negative.   Gastrointestinal: Negative.     Physical Exam Updated Vital Signs BP (!) 155/88   Pulse 66   Temp 98 F (36.7 C) (Oral)   Resp 16   Ht 5\' 10"  (1.778 m)   Wt 70.3 kg   SpO2 98%   BMI 22.24 kg/m  Physical Exam Vitals and nursing note reviewed.  Constitutional:      Appearance: He is not  ill-appearing or toxic-appearing.  HENT:     Head: Normocephalic and atraumatic.     Mouth/Throat:     Mouth: Mucous membranes are moist.     Dentition: Abnormal dentition. No gingival swelling or dental abscesses.     Pharynx: Oropharynx is clear. Uvula midline. No oropharyngeal exudate or posterior oropharyngeal erythema.     Tonsils: No tonsillar exudate.   Eyes:     General:        Right eye: No discharge.        Left eye: No discharge.     Conjunctiva/sclera: Conjunctivae normal.  Neck:     Trachea: Trachea and phonation normal.  Cardiovascular:     Rate and Rhythm: Normal rate and regular rhythm.     Pulses: Normal pulses.     Heart sounds: Normal heart sounds. No murmur heard. Pulmonary:     Effort: Pulmonary effort is normal.  No respiratory distress.     Breath sounds: Normal breath sounds. No wheezing or rales.  Abdominal:     General: Bowel sounds are normal. There is no distension.     Palpations: Abdomen is soft.     Tenderness: There is no abdominal tenderness. There is no guarding or rebound.  Musculoskeletal:        General: No deformity.     Cervical back: Normal range of motion and neck supple.  Skin:    General: Skin is warm and dry.     Capillary Refill: Capillary refill takes less than 2 seconds.  Neurological:     General: No focal deficit present.     Mental Status: He is alert and oriented to person, place, and time. Mental status is at baseline.  Psychiatric:        Mood and Affect: Mood normal.     ED Results / Procedures / Treatments   Labs (all labs ordered are listed, but only abnormal results are displayed) Labs Reviewed - No data to display  EKG None  Radiology No results found.  Procedures Procedures    Medications Ordered in ED Medications  ketorolac (TORADOL) 15 MG/ML injection 15 mg (15 mg Intramuscular Given 08/14/21 2006)  oxyCODONE-acetaminophen (PERCOCET/ROXICET) 5-325 MG per tablet 1 tablet (1 tablet Oral Given 08/15/21 0522)    ED Course/ Medical Decision Making/ A&P                           Medical Decision Making 71 year old male presents with concern for dental pain 1 day status post dental procedure with extractions .  Hypertensive.  Cardiopulmonary abdominal exams are benign.  Patient had improvement in his hypertension following administration of pain medication.  No evidence of acute infection of his dental procedure site.  Patient requesting something to eat, provided with apple sauce and pain medication, warm blankets.  Allowed to rest for period of time in the emergency department prior to discharge.    Risk Prescription drug management.   Overall patient is very well-appearing.  Suspect pain secondary to recent dental procedure without evidence  of infection.  Clinical concern for more emergent underlying etiology that would warrant further ED work-up or inpatient management is exceedingly low.  Daishaun  voiced understanding of his medical evaluation and treatment plan. Each of their questions answered to their expressed satisfaction.  Return precautions were given.  Patient is well-appearing, stable, and was discharged in good condition.  This chart was dictated using voice recognition software, Dragon. Despite the best efforts  of this provider to proofread and correct errors, errors may still occur which can change documentation meaning.   Final Clinical Impression(s) / ED Diagnoses Final diagnoses:  Pain, dental  Homelessness    Rx / DC Orders ED Discharge Orders          Ordered    oxyCODONE-acetaminophen (PERCOCET/ROXICET) 5-325 MG tablet  Every 6 hours PRN        08/15/21 0511              Denishia Citro, Eugene Gavia, PA-C 08/15/21 0730    Sabas Sous, MD 08/16/21 [phone]

## 2021-08-15 NOTE — ED Notes (Signed)
Patient verbalizes understanding of d/c instructions. Opportunities for questions and answers were provided. Pt d/c from ED and ambulated to lobby.  

## 2021-08-20 DIAGNOSIS — Z1152 Encounter for screening for COVID-19: Secondary | ICD-10-CM | POA: Diagnosis not present

## 2021-08-20 DIAGNOSIS — Z20822 Contact with and (suspected) exposure to covid-19: Secondary | ICD-10-CM | POA: Diagnosis not present

## 2021-08-28 DIAGNOSIS — Z1152 Encounter for screening for COVID-19: Secondary | ICD-10-CM | POA: Diagnosis not present

## 2021-09-23 DIAGNOSIS — Z20822 Contact with and (suspected) exposure to covid-19: Secondary | ICD-10-CM | POA: Diagnosis not present

## 2021-09-30 DIAGNOSIS — Z20822 Contact with and (suspected) exposure to covid-19: Secondary | ICD-10-CM | POA: Diagnosis not present

## 2021-10-07 DIAGNOSIS — Z20822 Contact with and (suspected) exposure to covid-19: Secondary | ICD-10-CM | POA: Diagnosis not present

## 2021-12-03 ENCOUNTER — Telehealth: Payer: Self-pay

## 2021-12-03 NOTE — Patient Outreach (Signed)
  Care Coordination   12/03/2021 Name: Andre Rojas MRN: [id number] DOB: January 25, 1951   Care Coordination Outreach Attempts:  An unsuccessful telephone outreach was attempted today to offer the patient information about available care coordination services as a benefit of their health plan.   Follow Up Plan:  Additional outreach attempts will be made to offer the patient care coordination information and services.   Encounter Outcome:  No Answer  Care Coordination Interventions Activated:  No   Care Coordination Interventions:  No, not indicated      Enzo Montgomery, RN,BSN,CCM [place] [facility] Telephonic Care Management Coordinator Direct Phone: [phone] Toll Free: [phone] Fax: [phone]

## 2021-12-10 ENCOUNTER — Telehealth: Payer: Self-pay

## 2021-12-10 NOTE — Patient Outreach (Signed)
  Care Coordination   12/10/2021 Name: BOND GRIESHOP MRN: [id number] DOB: Nov 30, 1950   Care Coordination Outreach Attempts:  A second unsuccessful outreach was attempted today to offer the patient with information about available care coordination services as a benefit of their health plan.     Follow Up Plan:  Additional outreach attempts will be made to offer the patient care coordination information and services.   Encounter Outcome:  No Answer  Care Coordination Interventions Activated:  No   Care Coordination Interventions:  No, not indicated    Enzo Montgomery, RN,BSN,CCM [place] [facility] Telephonic Care Management Coordinator Direct Phone: [phone] Toll Free: [phone] Fax: [phone]

## 2021-12-12 ENCOUNTER — Telehealth: Payer: Self-pay

## 2021-12-12 NOTE — Patient Outreach (Signed)
  Care Coordination   12/12/2021 Name: Andre Rojas MRN: [id number] DOB: 03/05/1951   Care Coordination Outreach Attempts:  A third unsuccessful outreach was attempted today to offer the patient with information about available care coordination services as a benefit of their health plan.   Follow Up Plan:  No further outreach attempts will be made at this time. We have been unable to contact the patient to offer or enroll patient in care coordination services  Encounter Outcome:  No Answer  Care Coordination Interventions Activated:  No   Care Coordination Interventions:  No, not indicated     Enzo Montgomery, RN,BSN,CCM [place] [facility] Telephonic Care Management Coordinator Direct Phone: [phone] Toll Free: [phone] Fax: [phone]

## 2022-01-23 ENCOUNTER — Telehealth: Payer: Self-pay

## 2022-01-23 ENCOUNTER — Encounter: Payer: Self-pay | Admitting: *Deleted

## 2022-01-23 NOTE — Telephone Encounter (Signed)
Call placed to patient to schedule an appointment to follow up on A1C. I was not able to contact patient by phone.

## 2022-01-23 NOTE — Progress Notes (Signed)
[facility] Quality Team Note  Name: LARENCE THONE Date of Birth: 05/25/1950 MRN: [id number] Date: 01/23/2022  [facility] Quality Team has reviewed this patient's chart, please see recommendations below:  [facility] Quality Other; (Pt has open gap for A1C.  Would need completed and if within range, gap could be closed.  Would need competed before the end of 2023.)

## 2022-02-05 NOTE — Telephone Encounter (Signed)
complete

## 2022-03-04 DIAGNOSIS — Z79899 Other long term (current) drug therapy: Secondary | ICD-10-CM | POA: Diagnosis not present

## 2022-03-19 ENCOUNTER — Encounter: Payer: Self-pay | Admitting: *Deleted

## 2022-03-19 NOTE — Congregational Nurse Program (Signed)
  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   Erectile dysfunction    Essential hypertension 03/20/2020   Gastro-esophageal reflux disease without esophagitis 03/20/2020   History of non-ST elevation myocardial infarction (NSTEMI) 06/26/2015   06/01/2015 in [place]    Hyperlipidemia    Hypertension    Lives in homeless shelter 03/20/2020   Pain in joint, pelvic region and thigh 03/20/2020   Peripheral vascular disease ([place])    right leg stays cold   Pneumonia    Psychosis ([place]) 10/16/2012   S/P CABG x 3 06/29/2015   Tobacco user 03/20/2020    Encounter Details:  CNP Questionnaire - 03/19/22 1111       Questionnaire   Ask client: Do you give verbal consent for me to treat you today? Yes    Student Assistance [employer]    Location Patient Served  [facility]    Visit Setting with Client Organization;Phone/Text/Email    Patient Status Unknown   [facility]   Insurance Medicare    Insurance/Financial Assistance Referral N/A    Medication N/A    Medical Provider Yes    Screening Referrals Made N/A    Medical Referrals Made [facility] P[facility]Clinic    Medical Appointment Made Non-[facility] PCP/clinic   appt verification   Recently w/o PCP, now 1st time PCP visit completed due to CNs referral or appointment made N/A    Food N/A    Transportation Need transportation assistance   [place] providing transportation   Housing/Utilities No permanent housing    Interpersonal Safety N/A    Interventions  Advocate/Support    Abnormal to Normal Screening Since Last CN Visit N/A    Screenings CN Performed Blood Pressure    Sent Client to Lab for: N/A    Did client attend any of the following based off CNs referral or appointments made? N/A    ED Visit Averted N/A    Life-Saving Intervention Made N/A           Client seen at [facility] from prior [employer] referral. Client came in nurse's office and reported he missed his last appt with [facility]. [place] and client has an appt January 26th at 1:40 with pick up from [place] !:00. Wrote all information and gave to client. Checked blood pressure as client reports he has a hx of three strokes. Blood pressure 106/76, pulse 79. Talked with client about importance of keeping appointments Discussed with Joelene Millin CM about possibility of Assisted Living for client as he reports having a difficult time remembering and has gotten lost in past. She is going to look into getting paperwork and escorting client to appt as Probation officer is already assisting another during that time. Magally Vahle W RN CN

## 2022-06-09 ENCOUNTER — Other Ambulatory Visit: Payer: Self-pay

## 2022-06-09 ENCOUNTER — Encounter (HOSPITAL_COMMUNITY): Payer: Self-pay | Admitting: Emergency Medicine

## 2022-06-09 ENCOUNTER — Inpatient Hospital Stay (HOSPITAL_COMMUNITY)
Admission: EM | Admit: 2022-06-09 | Discharge: 2022-06-13 | DRG: 445 | Disposition: A | Discharge status: Home / Self Care | Payer: Medicare HMO | Attending: Internal Medicine | Admitting: Internal Medicine

## 2022-06-09 ENCOUNTER — Emergency Department (HOSPITAL_COMMUNITY): Payer: Medicare HMO

## 2022-06-09 DIAGNOSIS — Z5901 Sheltered homelessness: Secondary | ICD-10-CM | POA: Diagnosis not present

## 2022-06-09 DIAGNOSIS — K810 Acute cholecystitis: Principal | ICD-10-CM

## 2022-06-09 DIAGNOSIS — Z8249 Family history of ischemic heart disease and other diseases of the circulatory system: Secondary | ICD-10-CM

## 2022-06-09 DIAGNOSIS — R1011 Right upper quadrant pain: Secondary | ICD-10-CM

## 2022-06-09 DIAGNOSIS — I358 Other nonrheumatic aortic valve disorders: Secondary | ICD-10-CM | POA: Diagnosis not present

## 2022-06-09 DIAGNOSIS — Z0189 Encounter for other specified special examinations: Secondary | ICD-10-CM | POA: Diagnosis not present

## 2022-06-09 DIAGNOSIS — M4856XA Collapsed vertebra, not elsewhere classified, lumbar region, initial encounter for fracture: Secondary | ICD-10-CM | POA: Diagnosis present

## 2022-06-09 DIAGNOSIS — E785 Hyperlipidemia, unspecified: Secondary | ICD-10-CM | POA: Diagnosis present

## 2022-06-09 DIAGNOSIS — E1165 Type 2 diabetes mellitus with hyperglycemia: Secondary | ICD-10-CM | POA: Diagnosis present

## 2022-06-09 DIAGNOSIS — E119 Type 2 diabetes mellitus without complications: Secondary | ICD-10-CM

## 2022-06-09 DIAGNOSIS — Z7982 Long term (current) use of aspirin: Secondary | ICD-10-CM

## 2022-06-09 DIAGNOSIS — F17210 Nicotine dependence, cigarettes, uncomplicated: Secondary | ICD-10-CM | POA: Diagnosis present

## 2022-06-09 DIAGNOSIS — E1151 Type 2 diabetes mellitus with diabetic peripheral angiopathy without gangrene: Secondary | ICD-10-CM | POA: Diagnosis present

## 2022-06-09 DIAGNOSIS — K219 Gastro-esophageal reflux disease without esophagitis: Secondary | ICD-10-CM

## 2022-06-09 DIAGNOSIS — I959 Hypotension, unspecified: Secondary | ICD-10-CM | POA: Diagnosis not present

## 2022-06-09 DIAGNOSIS — Z800 Family history of malignant neoplasm of digestive organs: Secondary | ICD-10-CM | POA: Diagnosis not present

## 2022-06-09 DIAGNOSIS — Z7902 Long term (current) use of antithrombotics/antiplatelets: Secondary | ICD-10-CM

## 2022-06-09 DIAGNOSIS — J441 Chronic obstructive pulmonary disease with (acute) exacerbation: Secondary | ICD-10-CM | POA: Diagnosis present

## 2022-06-09 DIAGNOSIS — I2510 Atherosclerotic heart disease of native coronary artery without angina pectoris: Secondary | ICD-10-CM | POA: Diagnosis present

## 2022-06-09 DIAGNOSIS — I10 Essential (primary) hypertension: Secondary | ICD-10-CM | POA: Diagnosis present

## 2022-06-09 DIAGNOSIS — R601 Generalized edema: Secondary | ICD-10-CM | POA: Diagnosis not present

## 2022-06-09 DIAGNOSIS — Z79899 Other long term (current) drug therapy: Secondary | ICD-10-CM | POA: Diagnosis not present

## 2022-06-09 DIAGNOSIS — E876 Hypokalemia: Secondary | ICD-10-CM | POA: Diagnosis present

## 2022-06-09 DIAGNOSIS — Z888 Allergy status to other drugs, medicaments and biological substances status: Secondary | ICD-10-CM

## 2022-06-09 DIAGNOSIS — K8000 Calculus of gallbladder with acute cholecystitis without obstruction: Principal | ICD-10-CM | POA: Diagnosis present

## 2022-06-09 DIAGNOSIS — I252 Old myocardial infarction: Secondary | ICD-10-CM | POA: Diagnosis not present

## 2022-06-09 DIAGNOSIS — M549 Dorsalgia, unspecified: Secondary | ICD-10-CM | POA: Diagnosis present

## 2022-06-09 DIAGNOSIS — G8929 Other chronic pain: Secondary | ICD-10-CM | POA: Diagnosis present

## 2022-06-09 DIAGNOSIS — F319 Bipolar disorder, unspecified: Secondary | ICD-10-CM | POA: Diagnosis present

## 2022-06-09 DIAGNOSIS — Z951 Presence of aortocoronary bypass graft: Secondary | ICD-10-CM

## 2022-06-09 DIAGNOSIS — F1410 Cocaine abuse, uncomplicated: Secondary | ICD-10-CM | POA: Diagnosis present

## 2022-06-09 DIAGNOSIS — X58XXXA Exposure to other specified factors, initial encounter: Secondary | ICD-10-CM | POA: Diagnosis present

## 2022-06-09 DIAGNOSIS — Z7951 Long term (current) use of inhaled steroids: Secondary | ICD-10-CM | POA: Diagnosis not present

## 2022-06-09 DIAGNOSIS — F201 Disorganized schizophrenia: Secondary | ICD-10-CM | POA: Diagnosis present

## 2022-06-09 DIAGNOSIS — J4489 Other specified chronic obstructive pulmonary disease: Secondary | ICD-10-CM | POA: Diagnosis present

## 2022-06-09 DIAGNOSIS — Z720 Tobacco use: Secondary | ICD-10-CM | POA: Diagnosis present

## 2022-06-09 DIAGNOSIS — I517 Cardiomegaly: Secondary | ICD-10-CM | POA: Diagnosis not present

## 2022-06-09 DIAGNOSIS — Z8673 Personal history of transient ischemic attack (TIA), and cerebral infarction without residual deficits: Secondary | ICD-10-CM

## 2022-06-09 DIAGNOSIS — Z7984 Long term (current) use of oral hypoglycemic drugs: Secondary | ICD-10-CM

## 2022-06-09 LAB — URINALYSIS, ROUTINE W REFLEX MICROSCOPIC
Bacteria, UA: NONE SEEN
Bilirubin Urine: NEGATIVE
Glucose, UA: NEGATIVE mg/dL
Hgb urine dipstick: NEGATIVE
Ketones, ur: 20 mg/dL — AB
Leukocytes,Ua: NEGATIVE
Nitrite: NEGATIVE
Protein, ur: 30 mg/dL — AB
Specific Gravity, Urine: 1.018 (ref 1.005–1.030)
pH: 6 (ref 5.0–8.0)

## 2022-06-09 LAB — CBC WITH DIFFERENTIAL/PLATELET
Abs Immature Granulocytes: 0.04 10*3/uL (ref 0.00–0.07)
Basophils Absolute: 0.1 10*3/uL (ref 0.0–0.1)
Basophils Relative: 0 %
Eosinophils Absolute: 0.1 10*3/uL (ref 0.0–0.5)
Eosinophils Relative: 1 %
HCT: 41 % (ref 39.0–52.0)
Hemoglobin: 13.4 g/dL (ref 13.0–17.0)
Immature Granulocytes: 0 %
Lymphocytes Relative: 9 %
Lymphs Abs: 1.1 10*3/uL (ref 0.7–4.0)
MCH: 27.3 pg (ref 26.0–34.0)
MCHC: 32.7 g/dL (ref 30.0–36.0)
MCV: 83.7 fL (ref 80.0–100.0)
Monocytes Absolute: 1 10*3/uL (ref 0.1–1.0)
Monocytes Relative: 9 %
Neutro Abs: 9.1 10*3/uL — ABNORMAL HIGH (ref 1.7–7.7)
Neutrophils Relative %: 81 %
Platelets: 233 10*3/uL (ref 150–400)
RBC: 4.9 MIL/uL (ref 4.22–5.81)
RDW: 14.2 % (ref 11.5–15.5)
WBC: 11.3 10*3/uL — ABNORMAL HIGH (ref 4.0–10.5)
nRBC: 0 % (ref 0.0–0.2)

## 2022-06-09 LAB — COMPREHENSIVE METABOLIC PANEL
ALT: 13 U/L (ref 0–44)
AST: 27 U/L (ref 15–41)
Albumin: 3.6 g/dL (ref 3.5–5.0)
Alkaline Phosphatase: 112 U/L (ref 38–126)
Anion gap: 11 (ref 5–15)
BUN: 6 mg/dL — ABNORMAL LOW (ref 8–23)
CO2: 23 mmol/L (ref 22–32)
Calcium: 8.7 mg/dL — ABNORMAL LOW (ref 8.9–10.3)
Chloride: 98 mmol/L (ref 98–111)
Creatinine, Ser: 0.67 mg/dL (ref 0.61–1.24)
GFR, Estimated: >60 mL/min (ref >60)
Glucose, Bld: 142 mg/dL — ABNORMAL HIGH (ref 70–99)
Potassium: 3.3 mmol/L — ABNORMAL LOW (ref 3.5–5.1)
Sodium: 132 mmol/L — ABNORMAL LOW (ref 135–145)
Total Bilirubin: 1.5 mg/dL — ABNORMAL HIGH (ref 0.3–1.2)
Total Protein: 7 g/dL (ref 6.5–8.1)

## 2022-06-09 LAB — GLUCOSE, CAPILLARY
Glucose-Capillary: 144 mg/dL — ABNORMAL HIGH (ref 70–99)
Glucose-Capillary: 133 mg/dL — ABNORMAL HIGH (ref 70–99)

## 2022-06-09 LAB — PROTIME-INR
INR: 1.1 (ref 0.8–1.2)
Prothrombin Time: 13.7 seconds (ref 11.4–15.2)

## 2022-06-09 LAB — HIV ANTIBODY (ROUTINE TESTING W REFLEX): HIV Screen 4th Generation wRfx: NONREACTIVE

## 2022-06-09 LAB — LIPASE, BLOOD: Lipase: 21 U/L (ref 11–51)

## 2022-06-09 MED ORDER — INSULIN ASPART 100 UNIT/ML IJ SOLN
0.0000 [IU] | Freq: Three times a day (TID) | INTRAMUSCULAR | Status: DC
  Administered 2022-06-09 – 2022-06-10 (×3): 2 [IU] via SUBCUTANEOUS
  Administered 2022-06-11 – 2022-06-13 (×4): 3 [IU] via SUBCUTANEOUS

## 2022-06-09 MED ORDER — IPRATROPIUM-ALBUTEROL 0.5-2.5 (3) MG/3ML IN SOLN: 3.0000 mL | Freq: Four times a day (QID) | RESPIRATORY_TRACT | Status: DC | PRN

## 2022-06-09 MED ORDER — MOMETASONE FURO-FORMOTEROL FUM 200-5 MCG/ACT IN AERO
2.0000 | INHALATION_SPRAY | Freq: Two times a day (BID) | RESPIRATORY_TRACT | Status: DC
  Administered 2022-06-09 – 2022-06-13 (×8): 2 via RESPIRATORY_TRACT
  Filled 2022-06-09: qty 8.8

## 2022-06-09 MED ORDER — PIPERACILLIN-TAZOBACTAM 3.375 G IVPB 30 MIN
3.3750 g | Freq: Once | INTRAVENOUS | Status: AC
  Administered 2022-06-09: 3.375 g via INTRAVENOUS
  Filled 2022-06-09: qty 50

## 2022-06-09 MED ORDER — ONDANSETRON HCL 4 MG/2ML IJ SOLN
4.0000 mg | Freq: Four times a day (QID) | INTRAMUSCULAR | Status: DC | PRN
  Administered 2022-06-09: 4 mg via INTRAVENOUS
  Filled 2022-06-09: qty 2

## 2022-06-09 MED ORDER — DOXYCYCLINE HYCLATE 100 MG PO TABS
100.0000 mg | ORAL_TABLET | Freq: Two times a day (BID) | ORAL | Status: DC
  Filled 2022-06-09: qty 1

## 2022-06-09 MED ORDER — ALBUTEROL SULFATE HFA 108 (90 BASE) MCG/ACT IN AERS: 2.0000 | INHALATION_SPRAY | Freq: Four times a day (QID) | RESPIRATORY_TRACT | Status: DC | PRN

## 2022-06-09 MED ORDER — LACTATED RINGERS IV SOLN: INTRAVENOUS | Status: DC

## 2022-06-09 MED ORDER — FENTANYL CITRATE PF 50 MCG/ML IJ SOSY
50.0000 ug | PREFILLED_SYRINGE | Freq: Once | INTRAMUSCULAR | Status: AC
  Administered 2022-06-09: 50 ug via INTRAVENOUS
  Filled 2022-06-09: qty 1

## 2022-06-09 MED ORDER — ACETAMINOPHEN 325 MG PO TABS: 650.0000 mg | ORAL_TABLET | Freq: Four times a day (QID) | ORAL | Status: DC | PRN

## 2022-06-09 MED ORDER — ATORVASTATIN CALCIUM 80 MG PO TABS
80.0000 mg | ORAL_TABLET | Freq: Every day | ORAL | Status: DC
  Administered 2022-06-10 – 2022-06-12 (×3): 80 mg via ORAL
  Filled 2022-06-09 (×3): qty 1

## 2022-06-09 MED ORDER — CARVEDILOL 12.5 MG PO TABS
12.5000 mg | ORAL_TABLET | Freq: Two times a day (BID) | ORAL | Status: DC
  Administered 2022-06-10: 12.5 mg via ORAL
  Filled 2022-06-09: qty 1

## 2022-06-09 MED ORDER — HYDRALAZINE HCL 20 MG/ML IJ SOLN
10.0000 mg | INTRAMUSCULAR | Status: DC | PRN
  Administered 2022-06-09: 10 mg via INTRAVENOUS
  Filled 2022-06-09: qty 1

## 2022-06-09 MED ORDER — NICOTINE 14 MG/24HR TD PT24
14.0000 mg | MEDICATED_PATCH | Freq: Every day | TRANSDERMAL | Status: DC
  Filled 2022-06-09 (×3): qty 1

## 2022-06-09 MED ORDER — IOHEXOL 350 MG/ML SOLN
75.0000 mL | Freq: Once | INTRAVENOUS | Status: AC | PRN
Start: 2022-06-09 — End: 2022-06-09
  Administered 2022-06-09: 75 mL via INTRAVENOUS

## 2022-06-09 MED ORDER — ACETAMINOPHEN 650 MG RE SUPP: 650.0000 mg | Freq: Four times a day (QID) | RECTAL | Status: DC | PRN

## 2022-06-09 MED ORDER — PREDNISONE 20 MG PO TABS
20.0000 mg | ORAL_TABLET | Freq: Every day | ORAL | Status: DC
  Administered 2022-06-11 – 2022-06-13 (×3): 20 mg via ORAL
  Filled 2022-06-09 (×3): qty 1

## 2022-06-09 MED ORDER — ONDANSETRON 4 MG PO TBDP: 4.0000 mg | ORAL_TABLET | Freq: Four times a day (QID) | ORAL | Status: DC | PRN

## 2022-06-09 MED ORDER — OXYCODONE HCL 5 MG PO TABS: 5.0000 mg | ORAL_TABLET | ORAL | Status: DC | PRN

## 2022-06-09 MED ORDER — MORPHINE SULFATE (PF) 2 MG/ML IV SOLN
2.0000 mg | INTRAVENOUS | Status: DC | PRN
  Administered 2022-06-09 (×2): 2 mg via INTRAVENOUS
  Filled 2022-06-09 (×4): qty 1

## 2022-06-09 MED ORDER — TRAZODONE HCL 100 MG PO TABS: 100.0000 mg | ORAL_TABLET | Freq: Every evening | ORAL | Status: DC | PRN

## 2022-06-09 MED ORDER — ASPIRIN 325 MG PO TBEC: 325.0000 mg | DELAYED_RELEASE_TABLET | Freq: Every day | ORAL | Status: DC

## 2022-06-09 MED ORDER — ALBUTEROL SULFATE (2.5 MG/3ML) 0.083% IN NEBU: 2.5000 mg | INHALATION_SOLUTION | RESPIRATORY_TRACT | Status: DC | PRN

## 2022-06-09 MED ORDER — LACTATED RINGERS IV BOLUS
1000.0000 mL | Freq: Once | INTRAVENOUS | Status: AC
  Administered 2022-06-09: 1000 mL via INTRAVENOUS

## 2022-06-09 MED ORDER — QUETIAPINE FUMARATE 50 MG PO TABS
100.0000 mg | ORAL_TABLET | Freq: Every day | ORAL | Status: DC
  Administered 2022-06-10 – 2022-06-12 (×3): 100 mg via ORAL
  Filled 2022-06-09 (×4): qty 2

## 2022-06-09 MED ORDER — PIPERACILLIN-TAZOBACTAM 3.375 G IVPB
3.3750 g | Freq: Three times a day (TID) | INTRAVENOUS | Status: DC
  Administered 2022-06-09 – 2022-06-12 (×8): 3.375 g via INTRAVENOUS
  Filled 2022-06-09 (×8): qty 50

## 2022-06-09 NOTE — ED Provider Notes (Signed)
[facility] Provider Note   CSN: [id number] Arrival date & time: 06/09/22  [id number]     History  Chief Complaint  Patient presents with   Abdominal Pain    Andre Rojas is a 72 y.o. male.  HPI   72 year old male with medical history significant for GERD, CAD status post CABG, COPD, polysubstance abuse, bipolar disorder and schizophrenia, homelessness who presents to the emergency department with right upper quadrant and epigastric abdominal pain.  The patient states that the pain started yesterday.  He had a last bowel movement yesterday which was normal.  He states that today he is currently not passing gas.  He endorses worsening pain in the right upper quadrant, no nausea or vomiting.  He denies any chest pain or shortness of breath.  CBG with EMS was 154 and he was vitally stable, mildly hypertensive.  He does endorse cocaine use, last cocaine use 3 days ago.  Home Medications Prior to Admission medications   Medication Sig Start Date End Date Taking? Authorizing Provider  albuterol (VENTOLIN HFA) 108 (90 Base) MCG/ACT inhaler Inhale 2 puffs into the lungs every 6 (six) hours as needed for wheezing or shortness of breath. 08/16/20  Yes Elsie Stain, MD  budesonide-formoterol [facility]) 160-4.5 MCG/ACT inhaler Inhale 2 puffs into the lungs 2 (two) times daily. Patient taking differently: Inhale 1-2 puffs into the lungs 2 (two) times daily as needed (wheezing, shortness of breath). 08/16/20 08/16/22 Yes Elsie Stain, MD  clopidogrel (PLAVIX) 75 MG tablet Take 1 tablet (75 mg total) by mouth daily. Take with aspirin for 1 month, then take plavix alone Patient taking differently: Take 75 mg by mouth daily. 08/16/20  Yes Elsie Stain, MD  QUEtiapine (SEROQUEL) 100 MG tablet Take 1 tablet (100 mg total) by mouth at bedtime. 08/16/20  Yes Elsie Stain, MD  traZODone (DESYREL) 100 MG tablet Take 1 tablet (100 mg total) by mouth at bedtime  as needed. for sleep Patient taking differently: Take 100 mg by mouth at bedtime as needed for sleep. 08/16/20  Yes Elsie Stain, MD  aspirin EC 325 MG tablet Take 1 tablet (325 mg total) by mouth daily. For one month then stop Patient not taking: Reported on 06/09/2022 08/16/20   Elsie Stain, MD  atorvastatin (LIPITOR) 80 MG tablet Take 1 tablet (80 mg total) by mouth daily at 6 PM. Patient not taking: Reported on 06/09/2022 08/16/20 09/15/20  Elsie Stain, MD  carvedilol (COREG) 12.5 MG tablet Take 1 tablet (12.5 mg total) by mouth 2 (two) times daily with a meal. Patient not taking: Reported on 06/09/2022 08/16/20 09/15/20  Elsie Stain, MD  ezetimibe (ZETIA) 10 MG tablet Take 1 tablet (10 mg total) by mouth daily. Patient not taking: Reported on 06/09/2022 08/17/20   Freada Bergeron, MD  fluticasone [facility]) 50 MCG/ACT nasal spray USE 2 SPRAYS IN [facility] NOSTRIL EVERY DAY Patient not taking: Reported on 06/09/2022 11/14/20   Elsie Stain, MD  metFORMIN (GLUCOPHAGE) 500 MG tablet Take 1 tablet (500 mg total) by mouth daily with breakfast. Patient not taking: Reported on 06/09/2022 08/16/20   Elsie Stain, MD  pantoprazole (PROTONIX) 40 MG tablet TAKE 1 TABLET BY MOUTH EVERY DAY WITH BREAKFAST Patient not taking: Reported on 06/09/2022 08/16/20   Elsie Stain, MD      Allergies    Glucophage [metformin]    Review of Systems   Review of Systems  All other systems reviewed and are negative.   Physical Exam Updated Vital Signs BP (!) 169/89 (BP Location: Left Arm)   Pulse 81   Temp (!) 97.5 F (36.4 C) (Oral)   Resp 17   Ht 5\' 10"  (1.778 m)   Wt 64.9 kg   SpO2 100%   BMI 20.52 kg/m  Physical Exam Vitals and nursing note reviewed.  Constitutional:      General: He is not in acute distress.    Appearance: He is well-developed. He is ill-appearing.  HENT:     Head: Normocephalic and atraumatic.  Eyes:     Conjunctiva/sclera: Conjunctivae normal.  Cardiovascular:      Rate and Rhythm: Normal rate and regular rhythm.     Heart sounds: No murmur heard. Pulmonary:     Effort: Pulmonary effort is normal. No respiratory distress.     Breath sounds: Normal breath sounds.  Abdominal:     Palpations: Abdomen is soft.     Tenderness: There is abdominal tenderness in the right upper quadrant and epigastric area. There is guarding.  Musculoskeletal:        General: No swelling.     Cervical back: Neck supple.  Skin:    General: Skin is warm and dry.     Capillary Refill: Capillary refill takes less than 2 seconds.  Neurological:     Mental Status: He is alert.  Psychiatric:        Mood and Affect: Mood normal.     ED Results / Procedures / Treatments   Labs (all labs ordered are listed, but only abnormal results are displayed) Labs Reviewed  CBC WITH DIFFERENTIAL/PLATELET - Abnormal; Notable for the following components:      Result Value   WBC 11.3 (*)    Neutro Abs 9.1 (*)    All other components within normal limits  COMPREHENSIVE METABOLIC PANEL - Abnormal; Notable for the following components:   Sodium 132 (*)    Potassium 3.3 (*)    Glucose, Bld 142 (*)    BUN 6 (*)    Calcium 8.7 (*)    Total Bilirubin 1.5 (*)    All other components within normal limits  URINALYSIS, ROUTINE W REFLEX MICROSCOPIC - Abnormal; Notable for the following components:   Ketones, ur 20 (*)    Protein, ur 30 (*)    All other components within normal limits  LIPASE, BLOOD  HIV ANTIBODY (ROUTINE TESTING W REFLEX)  HEMOGLOBIN A1C  PROTIME-INR    EKG EKG Interpretation  Date/Time:  Monday June 09 2022 08:08:25 EDT Ventricular Rate:  80 PR Interval:  142 QRS Duration: 80 QT Interval:  429 QTC Calculation: 495 R Axis:   82 Text Interpretation: Sinus rhythm Anterior infarct, old Confirmed by Regan Lemming ([id number]) on 06/09/2022 10:24:59 AM  Radiology US Abdomen Limited RUQ (LIVER/GB)  Result Date: 06/09/2022 CLINICAL DATA:  Right upper quadrant pain EXAM:  ULTRASOUND ABDOMEN LIMITED RIGHT UPPER QUADRANT COMPARISON:  CT 06/09/2022 FINDINGS: Gallbladder: Multiple echogenic, shadowing stones within the gallbladder measuring up to 1.9 cm in diameter. Diffuse gallbladder wall thickening of 0.6 cm. A positive sonographic Murphy sign noted by sonographer. Common bile duct: Diameter: 3 mm. Liver: No focal lesion or intraparenchymal fluid collection identified with attention to the gallbladder fossa. Within normal limits in parenchymal echogenicity. Portal vein is patent on color Doppler imaging with normal direction of blood flow towards the liver. Other: None. IMPRESSION: 1. Sonographic findings compatible with acute cholecystitis. 2. No focal lesion  or intraparenchymal fluid collection identified with attention to the gallbladder fossa to correspond to abnormality seen on the prior CT. Electronically Signed   By: Davina Poke D.O.   On: 06/09/2022 13:48   CT ABDOMEN PELVIS W CONTRAST  Result Date: 06/09/2022 CLINICAL DATA:  Right upper quadrant pain. EXAM: CT ABDOMEN AND PELVIS WITH CONTRAST TECHNIQUE: Multidetector CT imaging of the abdomen and pelvis was performed using the standard protocol following bolus administration of intravenous contrast. RADIATION DOSE REDUCTION: This exam was performed according to the departmental dose-optimization program which includes automated exposure control, adjustment of the mA and/or kV according to patient size and/or use of iterative reconstruction technique. CONTRAST:  62mL OMNIPAQUE IOHEXOL 350 MG/ML SOLN COMPARISON:  08/07/2012 FINDINGS: Lower chest: Cardiomegaly.  No pericardial or pleural effusion. Hepatobiliary: Grossly abnormal appearance of the gallbladder with pericholecystic fluid and areas of hypoattenuation surrounded by enhancement in the adjacent hepatic parenchyma that could represent abscesses. There are few peripherally enhancing lesions in the liver that could be abscesses in the setting of cholecystitis. A  neoplastic etiology is not excluded. Pancreas: Unremarkable. No pancreatic ductal dilatation or surrounding inflammatory changes. Spleen: Normal in size without focal abnormality. Adrenals/Urinary Tract: No adrenal lesions are identified. No hydronephrosis or stones identified. Urinary bladder wall thickening is nonspecific can be seen with inflammation or underdistention. Stomach/Bowel: Evaluation for bowel pathology was limited by lack of GI contrast. No bowel dilatation to suggest obstruction. Unremarkable appendix. Vascular/Lymphatic: Aortic atherosclerosis. No enlarged abdominal or pelvic lymph nodes. Reproductive: Prostate is unremarkable. Other: Diffuse subcutaneous and mesenteric edema consistent with anasarca. Musculoskeletal: Thoracolumbar degenerative changes. There is anterior wedge compression deformity of L1 which is new since 2014 and age-indeterminate. IMPRESSION: 1. Abnormal gallbladder with adjacent fluid and inflammatory changes. 2. Hepatic hypodensities with peripheral enhancement that could represent abscesses in the setting of infection although a neoplastic process cannot be excluded. 3. Nonspecific urinary bladder wall thickening. 4. Anasarca. 5. Age-indeterminate L1 compression deformity and thoracolumbar degenerative changes. Electronically Signed   By: Sammie Bench M.D.   On: 06/09/2022 11:41    Procedures Procedures    Medications Ordered in ED Medications  piperacillin-tazobactam (ZOSYN) IVPB 3.375 g (has no administration in time range)  lactated ringers infusion ( Intravenous New Bag/Given 06/09/22 1433)  acetaminophen (TYLENOL) tablet 650 mg (has no administration in time range)    Or  acetaminophen (TYLENOL) suppository 650 mg (has no administration in time range)  oxyCODONE (Oxy IR/ROXICODONE) immediate release tablet 5-10 mg (has no administration in time range)  morphine (PF) 2 MG/ML injection 2 mg (has no administration in time range)  ondansetron (ZOFRAN-ODT)  disintegrating tablet 4 mg ( Oral See Alternative 06/09/22 1531)    Or  ondansetron (ZOFRAN) injection 4 mg (4 mg Intravenous Given 06/09/22 1531)  hydrALAZINE (APRESOLINE) injection 10 mg (10 mg Intravenous Given 06/09/22 1457)  insulin aspart (novoLOG) injection 0-15 Units (has no administration in time range)  aspirin EC tablet 325 mg (0 mg Oral Hold 06/09/22 1533)  atorvastatin (LIPITOR) tablet 80 mg (has no administration in time range)  mometasone-formoterol (DULERA) 200-5 MCG/ACT inhaler 2 puff (has no administration in time range)  carvedilol (COREG) tablet 12.5 mg (has no administration in time range)  QUEtiapine (SEROQUEL) tablet 100 mg (has no administration in time range)  traZODone (DESYREL) tablet 100 mg (has no administration in time range)  doxycycline (VIBRA-TABS) tablet 100 mg (has no administration in time range)  predniSONE (DELTASONE) tablet 20 mg (has no administration in time range)  ipratropium-albuterol (  DUONEB) 0.5-2.5 (3) MG/3ML nebulizer solution 3 mL (has no administration in time range)  albuterol (PROVENTIL) (2.5 MG/3ML) 0.083% nebulizer solution 2.5 mg (has no administration in time range)  nicotine (NICODERM CQ - dosed in mg/24 hours) patch 14 mg (has no administration in time range)  lactated ringers bolus 1,000 mL (0 mLs Intravenous Stopped 06/09/22 1101)  fentaNYL (SUBLIMAZE) injection 50 mcg (50 mcg Intravenous Given 06/09/22 0940)  iohexol (OMNIPAQUE) 350 MG/ML injection 75 mL (75 mLs Intravenous Contrast Given 06/09/22 1117)  fentaNYL (SUBLIMAZE) injection 50 mcg (50 mcg Intravenous Given 06/09/22 1240)  piperacillin-tazobactam (ZOSYN) IVPB 3.375 g (0 g Intravenous Stopped 06/09/22 1315)    ED Course/ Medical Decision Making/ A&P                             Medical Decision Making Amount and/or Complexity of Data Reviewed Labs: ordered. Radiology: ordered.  Risk Prescription drug management. Decision regarding hospitalization.    72 year old male with medical  history significant for GERD, CAD status post CABG, COPD, polysubstance abuse, bipolar disorder and schizophrenia, homelessness who presents to the emergency department with right upper quadrant and epigastric abdominal pain.  The patient states that the pain started yesterday.  He had a last bowel movement yesterday which was normal.  He states that today he is currently not passing gas.  He endorses worsening pain in the right upper quadrant, no nausea or vomiting.  He denies any chest pain or shortness of breath.  CBG with EMS was 154 and he was vitally stable, mildly hypertensive.  He does endorse cocaine use, last cocaine use 3 days ago.  On arrival, the patient was afebrile, not tachycardic or tachypneic, BP 191/109, saturating 97% on room air.  Sinus rhythm noted on cardiac telemetry.  Physical exam significant for right upper quadrant and epigastric tenderness to palpation with associated guarding.  Differential diagnosis includes cholelithiasis/cholecystitis, pancreatitis, small bowel obstruction.  The access was obtained and the patient was administered IV fentanyl, and IV fluid bolus with 1 L LR.  Initial CT of the abdomen pelvis was concerning for pericholecystic fluid.  Right upper quadrant ultrasound was concerning for acute cholecystitis.   CT Abdomen Pelvis: IMPRESSION:  1. Abnormal gallbladder with adjacent fluid and inflammatory  changes.  2. Hepatic hypodensities with peripheral enhancement that could  represent abscesses in the setting of infection although a  neoplastic process cannot be excluded.  3. Nonspecific urinary bladder wall thickening.  4. Anasarca.  5. Age-indeterminate L1 compression deformity and thoracolumbar  degenerative changes.    RUQ [place]: IMPRESSION:  1. Sonographic findings compatible with acute cholecystitis.  2. No focal lesion or intraparenchymal fluid collection identified  with attention to the gallbladder fossa to correspond to abnormality  seen  on the prior CT.     I consulted general surgery, Alferd Apa PA who recommended medicine admission given the patient's multiple medical comorbidities for medical preoperative clearance given the patient's use of Plavix.   IV Zosyn was ordered.  Laboratory evaluation significant for CBC with leukocytosis to 11.3, lipase 21, urinalysis without evidence of UTI, CMP with hypokalemia to 3.3, hyperglycemia 142 oh, normal LFTs, T. bili mildly elevated at 1.5.  The patient was updated regarding his diagnosis of acute cholecystitis.  Some concern for lesions in the liver concerning for possible hepatic microabscesses.  Hospitalist was consulted for admission, Dr. Lorin Mercy accepting.   Final Clinical Impression(s) / ED Diagnoses Final diagnoses:  RUQ pain  Acute cholecystitis    Rx / DC Orders ED Discharge Orders     None         Regan Lemming, MD 06/09/22 1557

## 2022-06-09 NOTE — Progress Notes (Signed)
Report called from ED spoke with Nadin,RN who stated that the patients BP is 194/112. I let nurse that BP needed to be stable before transfer to [place]. Nurse verbalized understanding.

## 2022-06-09 NOTE — ED Notes (Signed)
ED TO INPATIENT HANDOFF REPORT  ED Nurse Name and Phone #: [phone]  S Name/Age/Gender Andre Rojas 72 y.o. male Room/Bed: 029C/029C  Code Status   Code Status: Prior  Home/SNF/Other Home Patient oriented to: self Is this baseline? Yes   Triage Complete: Triage complete  Chief Complaint Acute cholecystitis [K81.0]  Triage Note Pt BIB [facility] from [facility]. Per EMS patient has been c/o of right upper abdominal pain started yesterday. Pt denies nausea, vomiting, diarrhea or constipation. EMS reported patient have been urinating and eating fine. Pt c/o tenderness and guarding of the area. The pain is 7/10. Per EMS he c/o shortness of breath. BP 160/110, pulse 70, Saturation 99% on RA. Blood Sugar 154. Patient is A & O x 4.   Allergies No Known Allergies  Level of Care/Admitting Diagnosis ED Disposition     ED Disposition  Admit   Condition  --   Comment  Hospital Area: [facility] [[id number]]  Level of Care: Med-Surg [16]  May admit patient to [facility] or [facility] if equivalent level of care is available:: Yes  Covid Evaluation: Asymptomatic - no recent exposure (last 10 days) testing not required  Diagnosis: Acute cholecystitis [575.0.ICD-9-CM]  Admitting Physician: Karmen Bongo [[id number]]  Attending Physician: Karmen Bongo [id number]  Certification:: I certify this patient will need inpatient services for at least 2 midnights  Estimated Length of Stay: 3          B Medical/Surgery History 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   Erectile dysfunction    Essential hypertension 03/20/2020   Gastro-esophageal reflux disease without esophagitis 03/20/2020   History of non-ST  elevation myocardial infarction (NSTEMI) 06/26/2015   06/01/2015 in [place]    Hyperlipidemia    Lives in homeless shelter 03/20/2020   Pain in joint, pelvic region and thigh 03/20/2020   Peripheral vascular disease    right leg stays cold   Pneumonia    Psychosis 10/16/2012   S/P CABG x 3 06/29/2015   Tobacco user 03/20/2020   Past Surgical History:  Procedure Laterality Date   CARDIAC CATHETERIZATION  05/2015   COLONOSCOPY     CORONARY ARTERY BYPASS GRAFT N/A 06/29/2015   Procedure: CORONARY ARTERY BYPASS GRAFTING (CABG) TIMES THREE USING LEFT INTERNAL MAMMARY ARTERY AND RIGHT GREATER SAPHENOUS VEIN HARVESTED BY ENDOVEIN;  Surgeon: Melrose Nakayama, MD;  Location: [place];  Service: Open Heart Surgery;  Laterality: N/A;   KNEE SURGERY Left    TEE WITHOUT CARDIOVERSION N/A 06/29/2015   Procedure: TRANSESOPHAGEAL ECHOCARDIOGRAM (TEE);  Surgeon: Melrose Nakayama, MD;  Location: [place];  Service: Open Heart Surgery;  Laterality: N/A;     A IV Location/Drains/Wounds Patient Lines/Drains/Airways Status     Active Line/Drains/Airways     Name Placement date Placement time Site Days   Peripheral IV 06/09/22 20 G Right Antecubital 06/09/22  0841  Antecubital  less than 1            Intake/Output Last 24 hours No intake or output data in the 24 hours ending 06/09/22 1342  Labs/Imaging Results for orders placed or performed during the hospital encounter of 06/09/22 (from the past  48 hour(s))  CBC with Differential     Status: Abnormal   Collection Time: 06/09/22  8:51 AM  Result Value Ref Range   WBC 11.3 (H) 4.0 - 10.5 K/uL   RBC 4.90 4.22 - 5.81 MIL/uL   Hemoglobin 13.4 13.0 - 17.0 g/dL   HCT 41.0 39.0 - 52.0 %   MCV 83.7 80.0 - 100.0 fL   MCH 27.3 26.0 - 34.0 pg   MCHC 32.7 30.0 - 36.0 g/dL   RDW 14.2 11.5 - 15.5 %   Platelets 233 150 - 400 K/uL   nRBC 0.0 0.0 - 0.2 %   Neutrophils Relative % 81 %   Neutro Abs 9.1 (H) 1.7 - 7.7 K/uL   Lymphocytes  Relative 9 %   Lymphs Abs 1.1 0.7 - 4.0 K/uL   Monocytes Relative 9 %   Monocytes Absolute 1.0 0.1 - 1.0 K/uL   Eosinophils Relative 1 %   Eosinophils Absolute 0.1 0.0 - 0.5 K/uL   Basophils Relative 0 %   Basophils Absolute 0.1 0.0 - 0.1 K/uL   Immature Granulocytes 0 %   Abs Immature Granulocytes 0.04 0.00 - 0.07 K/uL    Comment: Performed at [facility] [address], [place], [place] [address]  Comprehensive metabolic panel     Status: Abnormal   Collection Time: 06/09/22  8:51 AM  Result Value Ref Range   Sodium 132 (L) 135 - 145 mmol/L   Potassium 3.3 (L) 3.5 - 5.1 mmol/L   Chloride 98 98 - 111 mmol/L   CO2 23 22 - 32 mmol/L   Glucose, Bld 142 (H) 70 - 99 mg/dL    Comment: Glucose reference range applies only to samples taken after fasting for at least 8 hours.   BUN 6 (L) 8 - 23 mg/dL   Creatinine, Ser 0.67 0.61 - 1.24 mg/dL   Calcium 8.7 (L) 8.9 - 10.3 mg/dL   Total Protein 7.0 6.5 - 8.1 g/dL   Albumin 3.6 3.5 - 5.0 g/dL   AST 27 15 - 41 U/L   ALT 13 0 - 44 U/L   Alkaline Phosphatase 112 38 - 126 U/L   Total Bilirubin 1.5 (H) 0.3 - 1.2 mg/dL   GFR, Estimated >60 >60 mL/min    Comment: (NOTE) Calculated using the CKD-EPI Creatinine Equation (2021)    Anion gap 11 5 - 15    Comment: Performed at [facility] [address], [place], [place] [address]  Lipase, blood     Status: None   Collection Time: 06/09/22  8:51 AM  Result Value Ref Range   Lipase 21 11 - 51 U/L    Comment: Performed at [facility] [address], [place], [place] [address]  Urinalysis, Routine w reflex microscopic -Urine, Clean Catch     Status: Abnormal   Collection Time: 06/09/22  9:28 AM  Result Value Ref Range   Color, Urine YELLOW YELLOW   APPearance CLEAR CLEAR   Specific Gravity, Urine 1.018 1.005 - 1.030   pH 6.0 5.0 - 8.0   Glucose, UA NEGATIVE NEGATIVE mg/dL   Hgb urine dipstick NEGATIVE NEGATIVE   Bilirubin Urine NEGATIVE NEGATIVE   Ketones, ur 20 (A)  NEGATIVE mg/dL   Protein, ur 30 (A) NEGATIVE mg/dL   Nitrite NEGATIVE NEGATIVE   Leukocytes,Ua NEGATIVE NEGATIVE   RBC / HPF 0-5 0 - 5 RBC/hpf   WBC, UA 0-5 0 - 5 WBC/hpf   Bacteria, UA NONE  SEEN NONE SEEN   Squamous Epithelial / HPF 0-5 0 - 5 /HPF   Mucus PRESENT     Comment: Performed at [facility], [place] [address] [place], [place] [address]   CT ABDOMEN PELVIS W CONTRAST  Result Date: 06/09/2022 CLINICAL DATA:  Right upper quadrant pain. EXAM: CT ABDOMEN AND PELVIS WITH CONTRAST TECHNIQUE: Multidetector CT imaging of the abdomen and pelvis was performed using the standard protocol following bolus administration of intravenous contrast. RADIATION DOSE REDUCTION: This exam was performed according to the departmental dose-optimization program which includes automated exposure control, adjustment of the mA and/or kV according to patient size and/or use of iterative reconstruction technique. CONTRAST:  19mL OMNIPAQUE IOHEXOL 350 MG/ML SOLN COMPARISON:  08/07/2012 FINDINGS: Lower chest: Cardiomegaly.  No pericardial or pleural effusion. Hepatobiliary: Grossly abnormal appearance of the gallbladder with pericholecystic fluid and areas of hypoattenuation surrounded by enhancement in the adjacent hepatic parenchyma that could represent abscesses. There are few peripherally enhancing lesions in the liver that could be abscesses in the setting of cholecystitis. A neoplastic etiology is not excluded. Pancreas: Unremarkable. No pancreatic ductal dilatation or surrounding inflammatory changes. Spleen: Normal in size without focal abnormality. Adrenals/Urinary Tract: No adrenal lesions are identified. No hydronephrosis or stones identified. Urinary bladder wall thickening is nonspecific can be seen with inflammation or underdistention. Stomach/Bowel: Evaluation for bowel pathology was limited by lack of GI contrast. No bowel dilatation to suggest obstruction. Unremarkable appendix. Vascular/Lymphatic: Aortic  atherosclerosis. No enlarged abdominal or pelvic lymph nodes. Reproductive: Prostate is unremarkable. Other: Diffuse subcutaneous and mesenteric edema consistent with anasarca. Musculoskeletal: Thoracolumbar degenerative changes. There is anterior wedge compression deformity of L1 which is new since 2014 and age-indeterminate. IMPRESSION: 1. Abnormal gallbladder with adjacent fluid and inflammatory changes. 2. Hepatic hypodensities with peripheral enhancement that could represent abscesses in the setting of infection although a neoplastic process cannot be excluded. 3. Nonspecific urinary bladder wall thickening. 4. Anasarca. 5. Age-indeterminate L1 compression deformity and thoracolumbar degenerative changes. Electronically Signed   By: Sammie Bench M.D.   On: 06/09/2022 11:41    Pending Labs Unresulted Labs (From admission, onward)    None       Vitals/Pain Today's Vitals   06/09/22 0821 06/09/22 0841 06/09/22 0845 06/09/22 1043  BP:   (!) 190/108   Pulse:   76   Resp:   (!) 22   Temp:      TempSrc:      SpO2:   100%   Weight: 64.9 kg     Height: 5\' 10"  (1.778 m)     PainSc:  7   6     Isolation Precautions No active isolations  Medications Medications  piperacillin-tazobactam (ZOSYN) IVPB 3.375 g (has no administration in time range)  lactated ringers bolus 1,000 mL (0 mLs Intravenous Stopped 06/09/22 1101)  fentaNYL (SUBLIMAZE) injection 50 mcg (50 mcg Intravenous Given 06/09/22 0940)  iohexol (OMNIPAQUE) 350 MG/ML injection 75 mL (75 mLs Intravenous Contrast Given 06/09/22 1117)  fentaNYL (SUBLIMAZE) injection 50 mcg (50 mcg Intravenous Given 06/09/22 1240)  piperacillin-tazobactam (ZOSYN) IVPB 3.375 g (0 g Intravenous Stopped 06/09/22 1315)    Mobility walks with device     Focused Assessments GI   R Recommendations: See Admitting Provider Note  Report given to:   Additional Notes: Pt uses a cane at times. CT abdomen was done, because they suspected possible bowel  obstruction. He had a [place] Abd limited  RUQ liver/ gallbladder it have not been resulted.

## 2022-06-09 NOTE — Consult Note (Signed)
Chief Complaint: Patient was seen in consultation today for acute cholecystitis   Referring Physician(s): Dr. Karmen Bongo  Supervising Physician: Aletta Edouard  Patient Status: [facility] - In-pt  History of Present Illness: Andre Rojas is a 72 y.o. male with past medical history of anemia, arthritis, cocaine abuse, COPD, CM, CAD s/p CABG x3, DM, disorganized schizophrenia, HTN, NSTEMI who presents with acute abdominal pain.  Imaging and history consistent with acute cholecystitis.  Patient assessed by surgery who feels that due to the degree of inflammation/infection, patient would be best managed by percutaneous drainage.  IR consulted for percutaneous cholecystostomy tube placement.   Patient assessed at bedside.  He endorses RUQ pain. States he is aware of the plans to drain his gall bladder.  Reports he lives at home alone. He is aware of the risks and benefits of the procedure and is agreeable to proceed.   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   Erectile dysfunction    Essential hypertension 03/20/2020   Gastro-esophageal reflux disease without esophagitis 03/20/2020   History of non-ST elevation myocardial infarction (NSTEMI) 06/26/2015   06/01/2015 in [place]    Hyperlipidemia    Lives in homeless shelter 03/20/2020   Pain in joint, pelvic region and thigh 03/20/2020   Peripheral vascular disease    right leg stays cold   Pneumonia    Psychosis 10/16/2012   S/P CABG x 3 06/29/2015   Tobacco user 03/20/2020    Past Surgical History:  Procedure Laterality Date   CARDIAC CATHETERIZATION  05/2015   COLONOSCOPY     CORONARY ARTERY BYPASS GRAFT N/A 06/29/2015   Procedure: CORONARY ARTERY  BYPASS GRAFTING (CABG) TIMES THREE USING LEFT INTERNAL MAMMARY ARTERY AND RIGHT GREATER SAPHENOUS VEIN HARVESTED BY ENDOVEIN;  Surgeon: Melrose Nakayama, MD;  Location: [place];  Service: Open Heart Surgery;  Laterality: N/A;   KNEE SURGERY Left    TEE WITHOUT CARDIOVERSION N/A 06/29/2015   Procedure: TRANSESOPHAGEAL ECHOCARDIOGRAM (TEE);  Surgeon: Melrose Nakayama, MD;  Location: [place];  Service: Open Heart Surgery;  Laterality: N/A;    Allergies: Glucophage [metformin]  Medications: Prior to Admission medications   Medication Sig Start Date End Date Taking? Authorizing Provider  albuterol (VENTOLIN HFA) 108 (90 Base) MCG/ACT inhaler Inhale 2 puffs into the lungs every 6 (six) hours as needed for wheezing or shortness of breath. 08/16/20  Yes Elsie Stain, MD  budesonide-formoterol [facility]) 160-4.5 MCG/ACT inhaler Inhale 2 puffs into the lungs 2 (two) times daily. Patient taking differently: Inhale 1-2 puffs into the lungs 2 (two) times daily as needed (wheezing, shortness of breath). 08/16/20 08/16/22 Yes Elsie Stain, MD  clopidogrel (PLAVIX) 75 MG tablet Take 1 tablet (75 mg total) by mouth daily. Take with aspirin for 1 month, then take plavix alone Patient taking differently: Take 75 mg by mouth daily. 08/16/20  Yes Elsie Stain, MD  QUEtiapine (SEROQUEL) 100 MG tablet Take 1 tablet (100 mg total) by mouth at bedtime. 08/16/20  Yes Elsie Stain, MD  traZODone (DESYREL) 100 MG tablet Take 1 tablet (100 mg total) by mouth at bedtime as needed. for sleep Patient taking differently: Take  100 mg by mouth at bedtime as needed for sleep. 08/16/20  Yes Elsie Stain, MD  aspirin EC 325 MG tablet Take 1 tablet (325 mg total) by mouth daily. For one month then stop Patient not taking: Reported on 06/09/2022 08/16/20   Elsie Stain, MD  atorvastatin (LIPITOR) 80 MG tablet Take 1 tablet (80 mg total) by mouth daily at 6 PM. Patient not taking: Reported on 06/09/2022 08/16/20 09/15/20   Elsie Stain, MD  carvedilol (COREG) 12.5 MG tablet Take 1 tablet (12.5 mg total) by mouth 2 (two) times daily with a meal. Patient not taking: Reported on 06/09/2022 08/16/20 09/15/20  Elsie Stain, MD  ezetimibe (ZETIA) 10 MG tablet Take 1 tablet (10 mg total) by mouth daily. Patient not taking: Reported on 06/09/2022 08/17/20   Freada Bergeron, MD  fluticasone [facility]) 50 MCG/ACT nasal spray USE 2 SPRAYS IN [facility] NOSTRIL EVERY DAY Patient not taking: Reported on 06/09/2022 11/14/20   Elsie Stain, MD  metFORMIN (GLUCOPHAGE) 500 MG tablet Take 1 tablet (500 mg total) by mouth daily with breakfast. Patient not taking: Reported on 06/09/2022 08/16/20   Elsie Stain, MD  pantoprazole (PROTONIX) 40 MG tablet TAKE 1 TABLET BY MOUTH EVERY DAY WITH BREAKFAST Patient not taking: Reported on 06/09/2022 08/16/20   Elsie Stain, MD     Family History  Problem Relation Age of Onset   Cancer Mother        stomach   Heart disease Father     Social History   Socioeconomic History   Marital status: Single    Spouse name: Not on file   Number of children: Not on file   Years of education: Not on file   Highest education level: Not on file  Occupational History   Not on file  Tobacco Use   Smoking status: Every Day    Packs/day: 1.00    Years: 40.00    Additional pack years: 0.00    Total pack years: 40.00    Types: Cigarettes   Smokeless tobacco: Never   Tobacco comments:    Currently 4 cigs/week  Vaping Use   Vaping Use: Never used  Substance and Sexual Activity   Alcohol use: Not Currently    Comment: heavy alcohol use, none in about 6 months   Drug use: Not Currently    Types: "Crack" cocaine, Cocaine    Comment: last use 2 days ago, uses about 1 time per month   Sexual activity: Yes    Birth control/protection: Condom  Other Topics Concern   Not on file  Social History Narrative   Not on file   Social Determinants of Health   Financial Resource Strain: Not on  file  Food Insecurity: Not on file  Transportation Needs: Not on file  Physical Activity: Not on file  Stress: Not on file  Social Connections: Not on file     Review of Systems: A 12 point ROS discussed and pertinent positives are indicated in the HPI above.  All other systems are negative.  Review of Systems  Constitutional:  Negative for fatigue and fever.  Respiratory:  Negative for cough and shortness of breath.   Cardiovascular:  Negative for chest pain.  Gastrointestinal:  Positive for abdominal pain. Negative for diarrhea, nausea and vomiting.  Musculoskeletal:  Negative for back pain.  Psychiatric/Behavioral:  Negative for behavioral problems and confusion.     Vital Signs: BP (!) 169/89 (BP Location: Left Arm)  Pulse 81   Temp (!) 97.5 F (36.4 C) (Oral)   Resp 17   Ht 5\' 10"  (1.778 m)   Wt 143 lb (64.9 kg)   SpO2 100%   BMI 20.52 kg/m   Physical Exam Vitals and nursing note reviewed.  Constitutional:      General: He is not in acute distress.    Appearance: He is well-developed and normal weight. He is not ill-appearing.  Cardiovascular:     Rate and Rhythm: Normal rate and regular rhythm.  Pulmonary:     Effort: Pulmonary effort is normal.     Breath sounds: Normal breath sounds.  Abdominal:     General: Abdomen is flat.     Tenderness: There is abdominal tenderness in the right upper quadrant.  Skin:    General: Skin is warm and dry.  Neurological:     General: No focal deficit present.     Mental Status: He is alert and oriented to person, place, and time.      MD Evaluation Airway: WNL Heart: WNL Abdomen: WNL Chest/ Lungs: WNL ASA  Classification: 3 Mallampati/Airway Score: Two   Imaging: US Abdomen Limited RUQ (LIVER/GB)  Result Date: 06/09/2022 CLINICAL DATA:  Right upper quadrant pain EXAM: ULTRASOUND ABDOMEN LIMITED RIGHT UPPER QUADRANT COMPARISON:  CT 06/09/2022 FINDINGS: Gallbladder: Multiple echogenic, shadowing stones within the  gallbladder measuring up to 1.9 cm in diameter. Diffuse gallbladder wall thickening of 0.6 cm. A positive sonographic Murphy sign noted by sonographer. Common bile duct: Diameter: 3 mm. Liver: No focal lesion or intraparenchymal fluid collection identified with attention to the gallbladder fossa. Within normal limits in parenchymal echogenicity. Portal vein is patent on color Doppler imaging with normal direction of blood flow towards the liver. Other: None. IMPRESSION: 1. Sonographic findings compatible with acute cholecystitis. 2. No focal lesion or intraparenchymal fluid collection identified with attention to the gallbladder fossa to correspond to abnormality seen on the prior CT. Electronically Signed   By: Davina Poke D.O.   On: 06/09/2022 13:48   CT ABDOMEN PELVIS W CONTRAST  Result Date: 06/09/2022 CLINICAL DATA:  Right upper quadrant pain. EXAM: CT ABDOMEN AND PELVIS WITH CONTRAST TECHNIQUE: Multidetector CT imaging of the abdomen and pelvis was performed using the standard protocol following bolus administration of intravenous contrast. RADIATION DOSE REDUCTION: This exam was performed according to the departmental dose-optimization program which includes automated exposure control, adjustment of the mA and/or kV according to patient size and/or use of iterative reconstruction technique. CONTRAST:  62mL OMNIPAQUE IOHEXOL 350 MG/ML SOLN COMPARISON:  08/07/2012 FINDINGS: Lower chest: Cardiomegaly.  No pericardial or pleural effusion. Hepatobiliary: Grossly abnormal appearance of the gallbladder with pericholecystic fluid and areas of hypoattenuation surrounded by enhancement in the adjacent hepatic parenchyma that could represent abscesses. There are few peripherally enhancing lesions in the liver that could be abscesses in the setting of cholecystitis. A neoplastic etiology is not excluded. Pancreas: Unremarkable. No pancreatic ductal dilatation or surrounding inflammatory changes. Spleen: Normal in  size without focal abnormality. Adrenals/Urinary Tract: No adrenal lesions are identified. No hydronephrosis or stones identified. Urinary bladder wall thickening is nonspecific can be seen with inflammation or underdistention. Stomach/Bowel: Evaluation for bowel pathology was limited by lack of GI contrast. No bowel dilatation to suggest obstruction. Unremarkable appendix. Vascular/Lymphatic: Aortic atherosclerosis. No enlarged abdominal or pelvic lymph nodes. Reproductive: Prostate is unremarkable. Other: Diffuse subcutaneous and mesenteric edema consistent with anasarca. Musculoskeletal: Thoracolumbar degenerative changes. There is anterior wedge compression deformity of L1 which is new  since 2014 and age-indeterminate. IMPRESSION: 1. Abnormal gallbladder with adjacent fluid and inflammatory changes. 2. Hepatic hypodensities with peripheral enhancement that could represent abscesses in the setting of infection although a neoplastic process cannot be excluded. 3. Nonspecific urinary bladder wall thickening. 4. Anasarca. 5. Age-indeterminate L1 compression deformity and thoracolumbar degenerative changes. Electronically Signed   By: Sammie Bench M.D.   On: 06/09/2022 11:41    Labs:  CBC: Recent Labs    06/09/22 0851  WBC 11.3*  HGB 13.4  HCT 41.0  PLT 233    COAGS: No results for input(s): "INR", "APTT" in the last 8760 hours.  BMP: Recent Labs    06/09/22 0851  NA 132*  K 3.3*  CL 98  CO2 23  GLUCOSE 142*  BUN 6*  CALCIUM 8.7*  CREATININE 0.67  GFRNONAA >60    LIVER FUNCTION TESTS: Recent Labs    06/09/22 0851  BILITOT 1.5*  AST 27  ALT 13  ALKPHOS 112  PROT 7.0  ALBUMIN 3.6    TUMOR MARKERS: No results for input(s): "AFPTM", "CEA", "CA199", "CHROMGRNA" in the last 8760 hours.  Assessment and Plan: Acute cholecystitis Patient assessed by general surgery who felt patient at moderate risk of OR due to inflammation/degree of infection.  IR consulted for  percutaneous cholecystostomy tube placement.  Case reviewed by Dr. Dwaine Gale who approves patient for procedure.  He has been NPO.  INR ordered for tomorrow.   Of note, patient with history of bipolar 1 disorder, disorganized schizophrenia, substance abuse, currently living in [facility] housing.  He is alert, oriented, and understanding of the procedure and what drain placement entails including that the drain will remain in place for several weeks and he will need to manage the drain at home.   Risks and benefits discussed with the patient including, but not limited to bleeding, infection, gallbladder perforation, bile leak, sepsis or even death.  All of the patient's questions were answered, patient is agreeable to proceed. Consent signed and in chart.   Thank you for this interesting consult.  I greatly enjoyed meeting JACEON RELPH and look forward to participating in their care.  A copy of this report was sent to the requesting provider on this date.  Electronically Signed: Docia Barrier, PA 06/09/2022, 5:04 PM   I spent a total of 40 Minutes    in face to face in clinical consultation, greater than 50% of which was counseling/coordinating care for acute cholecystitis.

## 2022-06-09 NOTE — Consult Note (Signed)
Andre Rojas December 17, 1950  [id number].    Requesting MD: Dr. Regan Lemming Chief Complaint/Reason for Consult: Acute Cholecystitis   HPI: Andre Rojas is a 72 y.o. male with a hx of CVA on Plavix (last dose 4d ago?), HTN, HLD, PVD, CAD s/p CABG x 3, COPD, Bipolar disorder, Schizophrenia, Cocaine use (last use 3d ago) who presented to the ED for abdominal pain.  Patient reports her last 24 hours he developed acute onset of epigastric/RUQ abdominal pain with associated nausea and vomiting.  Denies fever. Unsure if worsened by PO intake. Nothing seems to make it better or worse. No history of similar symptoms in the past.  His workup is concerning for acute cholecystitis.  We were asked to see.  Prior Abdominal Surgeries: None Tobacco Use: 40+ pack year hx. Trying to quit. Down from 1PPD to 4 cigarettes per day Alcohol Use: Quit 8 months ago Substance use: Cocaine (last use 3d ago) Employment: Not currently working.  Patient reports he lives in housing provided by [facility]  ROS: ROS As above, see hpi  Family History  Problem Relation Age of Onset   Cancer Mother        stomach   Heart disease Father     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   Erectile dysfunction    Essential hypertension 03/20/2020   Gastro-esophageal reflux disease without esophagitis 03/20/2020   History of non-ST elevation myocardial infarction (NSTEMI) 06/26/2015   06/01/2015 in [place]    Hyperlipidemia    Lives in homeless shelter 03/20/2020   Pain in joint, pelvic region and thigh 03/20/2020   Peripheral vascular disease    right leg stays cold   Pneumonia    Psychosis 10/16/2012   S/P CABG x 3 06/29/2015   Tobacco user  03/20/2020    Past Surgical History:  Procedure Laterality Date   CARDIAC CATHETERIZATION  05/2015   COLONOSCOPY     CORONARY ARTERY BYPASS GRAFT N/A 06/29/2015   Procedure: CORONARY ARTERY BYPASS GRAFTING (CABG) TIMES THREE USING LEFT INTERNAL MAMMARY ARTERY AND RIGHT GREATER SAPHENOUS VEIN HARVESTED BY ENDOVEIN;  Surgeon: Melrose Nakayama, MD;  Location: [place];  Service: Open Heart Surgery;  Laterality: N/A;   KNEE SURGERY Left    TEE WITHOUT CARDIOVERSION N/A 06/29/2015   Procedure: TRANSESOPHAGEAL ECHOCARDIOGRAM (TEE);  Surgeon: Melrose Nakayama, MD;  Location: [place];  Service: Open Heart Surgery;  Laterality: N/A;    Social History:  reports that he has been smoking cigarettes. He has a 40.00 pack-year smoking history. He has never used smokeless tobacco. He reports that he does not currently use alcohol. He reports that he does not currently use drugs after having used the following drugs: "Crack" cocaine and Cocaine.  Allergies: No Known Allergies  (Not in a hospital admission)    Physical Exam: Blood pressure (!) 190/108, pulse 76, temperature 98.7 F (37.1 C), temperature source Oral, resp. rate (!) 22, height 5\' 10"  (1.778 m), weight 64.9 kg, SpO2 100 %. General: pleasant, WD/WN male who is laying in bed in NAD HEENT: head is normocephalic, atraumatic.  Sclera are noninjected.  PERRL.  Ears and nose without  any masses or lesions.  Mouth is pink and moist. Dentition fair Heart: regular, rate, and rhythm. Lungs: effort nonlabored Abd:  Soft, ND, epigastric and RUQ ttp. Otherwise NT. +BS. No masses, hernias, or organomegaly MS: MAE's Skin: warm and dry    Results for orders placed or performed during the hospital encounter of 06/09/22 (from the past 48 hour(s))  CBC with Differential     Status: Abnormal   Collection Time: 06/09/22  8:51 AM  Result Value Ref Range   WBC 11.3 (H) 4.0 - 10.5 K/uL   RBC 4.90 4.22 - 5.81 MIL/uL   Hemoglobin 13.4 13.0 - 17.0 g/dL   HCT  41.0 39.0 - 52.0 %   MCV 83.7 80.0 - 100.0 fL   MCH 27.3 26.0 - 34.0 pg   MCHC 32.7 30.0 - 36.0 g/dL   RDW 14.2 11.5 - 15.5 %   Platelets 233 150 - 400 K/uL   nRBC 0.0 0.0 - 0.2 %   Neutrophils Relative % 81 %   Neutro Abs 9.1 (H) 1.7 - 7.7 K/uL   Lymphocytes Relative 9 %   Lymphs Abs 1.1 0.7 - 4.0 K/uL   Monocytes Relative 9 %   Monocytes Absolute 1.0 0.1 - 1.0 K/uL   Eosinophils Relative 1 %   Eosinophils Absolute 0.1 0.0 - 0.5 K/uL   Basophils Relative 0 %   Basophils Absolute 0.1 0.0 - 0.1 K/uL   Immature Granulocytes 0 %   Abs Immature Granulocytes 0.04 0.00 - 0.07 K/uL    Comment: Performed at [facility], [address], [place], [place] [address]  Comprehensive metabolic panel     Status: Abnormal   Collection Time: 06/09/22  8:51 AM  Result Value Ref Range   Sodium 132 (L) 135 - 145 mmol/L   Potassium 3.3 (L) 3.5 - 5.1 mmol/L   Chloride 98 98 - 111 mmol/L   CO2 23 22 - 32 mmol/L   Glucose, Bld 142 (H) 70 - 99 mg/dL    Comment: Glucose reference range applies only to samples taken after fasting for at least 8 hours.   BUN 6 (L) 8 - 23 mg/dL   Creatinine, Ser 0.67 0.61 - 1.24 mg/dL   Calcium 8.7 (L) 8.9 - 10.3 mg/dL   Total Protein 7.0 6.5 - 8.1 g/dL   Albumin 3.6 3.5 - 5.0 g/dL   AST 27 15 - 41 U/L   ALT 13 0 - 44 U/L   Alkaline Phosphatase 112 38 - 126 U/L   Total Bilirubin 1.5 (H) 0.3 - 1.2 mg/dL   GFR, Estimated >60 >60 mL/min    Comment: (NOTE) Calculated using the CKD-EPI Creatinine Equation (2021)    Anion gap 11 5 - 15    Comment: Performed at [facility] [address], [place], [place] [address]  Lipase, blood     Status: None   Collection Time: 06/09/22  8:51 AM  Result Value Ref Range   Lipase 21 11 - 51 U/L    Comment: Performed at [facility] [address] [place], [place] [address]  Urinalysis, Routine w reflex microscopic -Urine, Clean Catch     Status: Abnormal   Collection Time: 06/09/22  9:28 AM  Result Value Ref  Range   Color, Urine YELLOW YELLOW   APPearance CLEAR CLEAR   Specific Gravity, Urine 1.018 1.005 - 1.030   pH 6.0 5.0 - 8.0   Glucose, UA NEGATIVE NEGATIVE mg/dL   Hgb  urine dipstick NEGATIVE NEGATIVE   Bilirubin Urine NEGATIVE NEGATIVE   Ketones, ur 20 (A) NEGATIVE mg/dL   Protein, ur 30 (A) NEGATIVE mg/dL   Nitrite NEGATIVE NEGATIVE   Leukocytes,Ua NEGATIVE NEGATIVE   RBC / HPF 0-5 0 - 5 RBC/hpf   WBC, UA 0-5 0 - 5 WBC/hpf   Bacteria, UA NONE SEEN NONE SEEN   Squamous Epithelial / HPF 0-5 0 - 5 /HPF   Mucus PRESENT     Comment: Performed at [facility], [place] [address] [place], [place] [address]   US Abdomen Limited RUQ (LIVER/GB)  Result Date: 06/09/2022 CLINICAL DATA:  Right upper quadrant pain EXAM: ULTRASOUND ABDOMEN LIMITED RIGHT UPPER QUADRANT COMPARISON:  CT 06/09/2022 FINDINGS: Gallbladder: Multiple echogenic, shadowing stones within the gallbladder measuring up to 1.9 cm in diameter. Diffuse gallbladder wall thickening of 0.6 cm. A positive sonographic Murphy sign noted by sonographer. Common bile duct: Diameter: 3 mm. Liver: No focal lesion or intraparenchymal fluid collection identified with attention to the gallbladder fossa. Within normal limits in parenchymal echogenicity. Portal vein is patent on color Doppler imaging with normal direction of blood flow towards the liver. Other: None. IMPRESSION: 1. Sonographic findings compatible with acute cholecystitis. 2. No focal lesion or intraparenchymal fluid collection identified with attention to the gallbladder fossa to correspond to abnormality seen on the prior CT. Electronically Signed   By: Davina Poke D.O.   On: 06/09/2022 13:48   CT ABDOMEN PELVIS W CONTRAST  Result Date: 06/09/2022 CLINICAL DATA:  Right upper quadrant pain. EXAM: CT ABDOMEN AND PELVIS WITH CONTRAST TECHNIQUE: Multidetector CT imaging of the abdomen and pelvis was performed using the standard protocol following bolus administration of intravenous  contrast. RADIATION DOSE REDUCTION: This exam was performed according to the departmental dose-optimization program which includes automated exposure control, adjustment of the mA and/or kV according to patient size and/or use of iterative reconstruction technique. CONTRAST:  51mL OMNIPAQUE IOHEXOL 350 MG/ML SOLN COMPARISON:  08/07/2012 FINDINGS: Lower chest: Cardiomegaly.  No pericardial or pleural effusion. Hepatobiliary: Grossly abnormal appearance of the gallbladder with pericholecystic fluid and areas of hypoattenuation surrounded by enhancement in the adjacent hepatic parenchyma that could represent abscesses. There are few peripherally enhancing lesions in the liver that could be abscesses in the setting of cholecystitis. A neoplastic etiology is not excluded. Pancreas: Unremarkable. No pancreatic ductal dilatation or surrounding inflammatory changes. Spleen: Normal in size without focal abnormality. Adrenals/Urinary Tract: No adrenal lesions are identified. No hydronephrosis or stones identified. Urinary bladder wall thickening is nonspecific can be seen with inflammation or underdistention. Stomach/Bowel: Evaluation for bowel pathology was limited by lack of GI contrast. No bowel dilatation to suggest obstruction. Unremarkable appendix. Vascular/Lymphatic: Aortic atherosclerosis. No enlarged abdominal or pelvic lymph nodes. Reproductive: Prostate is unremarkable. Other: Diffuse subcutaneous and mesenteric edema consistent with anasarca. Musculoskeletal: Thoracolumbar degenerative changes. There is anterior wedge compression deformity of L1 which is new since 2014 and age-indeterminate. IMPRESSION: 1. Abnormal gallbladder with adjacent fluid and inflammatory changes. 2. Hepatic hypodensities with peripheral enhancement that could represent abscesses in the setting of infection although a neoplastic process cannot be excluded. 3. Nonspecific urinary bladder wall thickening. 4. Anasarca. 5. Age-indeterminate  L1 compression deformity and thoracolumbar degenerative changes. Electronically Signed   By: Sammie Bench M.D.   On: 06/09/2022 11:41    Anti-infectives (From admission, onward)    Start     Dose/Rate Route Frequency Ordered Stop   06/09/22 2200  piperacillin-tazobactam (ZOSYN) IVPB 3.375 g  3.375 g 12.5 mL/hr over 240 Minutes Intravenous Every 8 hours 06/09/22 1234     06/09/22 1245  piperacillin-tazobactam (ZOSYN) IVPB 3.375 g        3.375 g 100 mL/hr over 30 Minutes Intravenous  Once 06/09/22 1233 06/09/22 1315       Assessment/Plan Acute Cholecystitis This is a 72 year old male with multiple medical problems who presented to the ED for epigastric/right upper quadrant abdominal pain. I suspect he has Acute Cholecystitis based on his history, labs, imaging and exam. His CT A/P showed abnormal gallbladder with adjacent fluid and inflammatory changes with hepatic hypodensities with peripheral enhancement c/w abscesses (although a neoplastic process cannot be excluded). RUQ US showed multiple gallstones measuring up to 1.9 cm with diffuse gallbladder wall thickening of 0.6 cm.  CBD 3 mm.  WBC 11.3. T. bili 1.5. LFTs otherwise within normal limits. Reviewed imaging with attending. Recommend IR percutaneous Cholecystostomy Tube given the degree of inflammation on imaging.  Discussed with patient plan to leave percutaneous cholecystostomy tube in place for ~6 weeks, repeat cholangiogram as outpatient and timing of follow up to discuss interval cholecystectomy.  Patient with significant cardiac history and has not been following up with Cardiology as an outpatient. I have asked cardiology to see for preoperative cardiovascular evaluation and to re-establish follow up. - Cont abx - Agree with medical admission. Discussed above plan with [facility]  FEN - NPO for IR eval, IVF per TR[facility] VTE - SCDs, okay for chem ppx from a general surgery standpoint. Please hold plavix  ID - Zosyn  Age  indeterminite L1 compression fx CVA on Plavix (he is unsure if he is still taking this but reports the last time he took any medications was 4d ago) Hx HTN Hx HLD PVD Hx CAD s/p CABG x 3 COPD w/ tobacco use Bipolar disorder Schizophrenia Cocaine use (last use 3d ago) who pr  I reviewed nursing notes, ED provider notes, last 24 h vitals and pain scores, last 48 h intake and output, last 24 h labs and trends, and last 24 h imaging results. I have consulted IR and Cardiology. Discussed case with [facility].   Jillyn Ledger, [facility] Surgery 06/09/2022, 2:01 PM Please see Amion for pager number during day hours 7:00am-4:30pm

## 2022-06-09 NOTE — Progress Notes (Signed)
Patient arrived to [facility] room 29 alert and oriented. Pain level 0/10. Fresh gown placed on patient. Bed in lowest position. Call light in reach. Will continue to monitor pt.

## 2022-06-09 NOTE — Progress Notes (Signed)
Pharmacy Antibiotic Note  Andre Rojas is a 72 y.o. male admitted on 06/09/2022 with abdominal pain, concern for intra-abdominal infection.  Pharmacy has been consulted for zosyn dosing.  Plan: Zosyn 3.375g IV every 8 hours (extended 4h infusion) Monitor renal function, LOT  Height: 5\' 10"  (177.8 cm) Weight: 64.9 kg (143 lb) IBW/kg (Calculated) : 73  Temp (24hrs), Avg:98.7 F (37.1 C), Min:98.7 F (37.1 C), Max:98.7 F (37.1 C)  Recent Labs  Lab 06/09/22 0851  WBC 11.3*  CREATININE 0.67    Estimated Creatinine Clearance: 80 mL/min (by C-G formula based on SCr of 0.67 mg/dL).    No Known Allergies  Bertis Ruddy, PharmD, [facility] Clinical Pharmacist ED Pharmacist Phone # [phone] 06/09/2022 12:35 PM

## 2022-06-09 NOTE — H&P (Signed)
History and Physical    Patient: Andre Rojas [id number] DOB: [birth year] DOA: 06/09/2022 DOS: the patient was seen and examined on 06/09/2022 PCP: Elsie Stain, MD  Patient coming from: [facility];  NOK: Brother, [phone]   Chief Complaint: Abdominal pain  HPI: Andre Rojas is a 72 y.o. male with medical history significant of bipolar d/o/schizophrenia, chronic back pain, cocaine dependence, CAD s/p CABG, COPD, DM, HTN, HLD, and homelessness presenting with abdominal pain.    He reports acute onset of abdominal pain last night in the mid-abdomen.  He started vomiting this AM.  No fever.  No sick contacts.  Pain is ongoing - Fentanyl helped but didn't stop it.  He does have a cough that is productive over the last 4 or so days.  No wheezing.    ER Course:  Gallstones, surgery requests [facility] admission. On Plavix.     Review of Systems: As mentioned in the history of present illness. All other systems reviewed and are negative. 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   Erectile dysfunction    Essential hypertension 03/20/2020   Gastro-esophageal reflux disease without esophagitis 03/20/2020   History of non-ST elevation myocardial infarction (NSTEMI) 06/26/2015   06/01/2015 in [place]    Hyperlipidemia    Lives in homeless shelter 03/20/2020   Pain in joint, pelvic region and thigh 03/20/2020   Peripheral vascular disease    right leg stays cold   Pneumonia    Psychosis 10/16/2012   S/P CABG x 3 06/29/2015   Tobacco user 03/20/2020   Past Surgical History:  Procedure Laterality Date   CARDIAC CATHETERIZATION  05/2015   COLONOSCOPY     CORONARY ARTERY BYPASS GRAFT N/A 06/29/2015    Procedure: CORONARY ARTERY BYPASS GRAFTING (CABG) TIMES THREE USING LEFT INTERNAL MAMMARY ARTERY AND RIGHT GREATER SAPHENOUS VEIN HARVESTED BY ENDOVEIN;  Surgeon: Melrose Nakayama, MD;  Location: [place];  Service: Open Heart Surgery;  Laterality: N/A;   KNEE SURGERY Left    TEE WITHOUT CARDIOVERSION N/A 06/29/2015   Procedure: TRANSESOPHAGEAL ECHOCARDIOGRAM (TEE);  Surgeon: Melrose Nakayama, MD;  Location: [place];  Service: Open Heart Surgery;  Laterality: N/A;   Social History:  reports that he has been smoking cigarettes. He has a 40.00 pack-year smoking history. He has never used smokeless tobacco. He reports that he does not currently use alcohol. He reports that he does not currently use drugs after having used the following drugs: "Crack" cocaine and Cocaine.  Allergies  Allergen Reactions   Glucophage [Metformin] Other (See Comments)    Weight loss    Family History  Problem Relation Age of Onset   Cancer Mother        stomach   Heart disease Father     Prior to Admission medications   Medication Sig Start Date End Date Taking? Authorizing Provider  albuterol (VENTOLIN HFA) 108 (90 Base) MCG/ACT inhaler Inhale 2 puffs into the lungs every 6 (six) hours as needed for wheezing or shortness of breath. 08/16/20   Elsie Stain, MD  aspirin EC 325 MG tablet Take 1 tablet (325 mg total) by mouth daily. For one month  then stop 08/16/20   Elsie Stain, MD  atorvastatin (LIPITOR) 80 MG tablet Take 1 tablet (80 mg total) by mouth daily at 6 PM. 08/16/20 09/15/20  Elsie Stain, MD  budesonide-formoterol [facility]) 160-4.5 MCG/ACT inhaler Inhale 2 puffs into the lungs 2 (two) times daily. 08/16/20 08/16/21  Elsie Stain, MD  carvedilol (COREG) 12.5 MG tablet Take 1 tablet (12.5 mg total) by mouth 2 (two) times daily with a meal. 08/16/20 09/15/20  Elsie Stain, MD  clopidogrel (PLAVIX) 75 MG tablet Take 1 tablet (75 mg total) by mouth daily. Take with aspirin for 1 month, then  take plavix alone 08/16/20   Elsie Stain, MD  ezetimibe (ZETIA) 10 MG tablet Take 1 tablet (10 mg total) by mouth daily. 08/17/20   Freada Bergeron, MD  fluticasone [facility]) 50 MCG/ACT nasal spray USE 2 SPRAYS IN [facility] NOSTRIL EVERY DAY 11/14/20   Elsie Stain, MD  metFORMIN (GLUCOPHAGE) 500 MG tablet Take 1 tablet (500 mg total) by mouth daily with breakfast. 08/16/20   Elsie Stain, MD  oxyCODONE-acetaminophen (PERCOCET/ROXICET) 5-325 MG tablet Take 1 tablet by mouth every 6 (six) hours as needed for severe pain. 08/15/21   Sponseller, Eugene Garnet R, PA-C  pantoprazole (PROTONIX) 40 MG tablet TAKE 1 TABLET BY MOUTH EVERY DAY WITH BREAKFAST 08/16/20   Elsie Stain, MD  QUEtiapine (SEROQUEL) 100 MG tablet Take 1 tablet (100 mg total) by mouth at bedtime. 08/16/20   Elsie Stain, MD  traZODone (DESYREL) 100 MG tablet Take 1 tablet (100 mg total) by mouth at bedtime as needed. for sleep 08/16/20   Elsie Stain, MD    Physical Exam: Vitals:   06/09/22 0845 06/09/22 1433 06/09/22 1445 06/09/22 1513  BP: (!) 190/108 (!) 189/160 (!) 176/108 (!) 167/91  Pulse: 76 82 (!) 107 88  Resp: (!) 22 (!) 26 15 16   Temp:  98.1 F (36.7 C)  98.2 F (36.8 C)  TempSrc:  Oral  Oral  SpO2: 100% 100% 100%   Weight:      Height:       General:  Appears calm and comfortable and is in NAD Eyes:   EOMI, normal lids, iris ENT:  grossly normal hearing, lips & tongue, mmm Neck:  no LAD, masses or thyromegaly Cardiovascular:  RRR, no m/r/g. No LE edema.  Respiratory:   CTA bilaterally with no wheezes/rales/rhonchi.  Normal respiratory effort.  Coarse cough.  Abdomen:  soft, TTP along RUQ and periumbilical regions Skin:  no rash or induration seen on limited exam Musculoskeletal:  grossly normal tone BUE/BLE, good ROM, no bony abnormality Psychiatric: blunted mood and affect, speech fluent and appropriate, AOx3 Neurologic:  CN 2-12 grossly intact, moves all extremities in coordinated  fashion   Radiological Exams on Admission: Independently reviewed - see discussion in A/P where applicable  US Abdomen Limited RUQ (LIVER/GB)  Result Date: 06/09/2022 CLINICAL DATA:  Right upper quadrant pain EXAM: ULTRASOUND ABDOMEN LIMITED RIGHT UPPER QUADRANT COMPARISON:  CT 06/09/2022 FINDINGS: Gallbladder: Multiple echogenic, shadowing stones within the gallbladder measuring up to 1.9 cm in diameter. Diffuse gallbladder wall thickening of 0.6 cm. A positive sonographic Murphy sign noted by sonographer. Common bile duct: Diameter: 3 mm. Liver: No focal lesion or intraparenchymal fluid collection identified with attention to the gallbladder fossa. Within normal limits in parenchymal echogenicity. Portal vein is patent on color Doppler imaging with normal direction of blood flow towards the liver. Other: None. IMPRESSION: 1. Sonographic findings compatible  with acute cholecystitis. 2. No focal lesion or intraparenchymal fluid collection identified with attention to the gallbladder fossa to correspond to abnormality seen on the prior CT. Electronically Signed   By: Davina Poke D.O.   On: 06/09/2022 13:48   CT ABDOMEN PELVIS W CONTRAST  Result Date: 06/09/2022 CLINICAL DATA:  Right upper quadrant pain. EXAM: CT ABDOMEN AND PELVIS WITH CONTRAST TECHNIQUE: Multidetector CT imaging of the abdomen and pelvis was performed using the standard protocol following bolus administration of intravenous contrast. RADIATION DOSE REDUCTION: This exam was performed according to the departmental dose-optimization program which includes automated exposure control, adjustment of the mA and/or kV according to patient size and/or use of iterative reconstruction technique. CONTRAST:  14mL OMNIPAQUE IOHEXOL 350 MG/ML SOLN COMPARISON:  08/07/2012 FINDINGS: Lower chest: Cardiomegaly.  No pericardial or pleural effusion. Hepatobiliary: Grossly abnormal appearance of the gallbladder with pericholecystic fluid and areas of  hypoattenuation surrounded by enhancement in the adjacent hepatic parenchyma that could represent abscesses. There are few peripherally enhancing lesions in the liver that could be abscesses in the setting of cholecystitis. A neoplastic etiology is not excluded. Pancreas: Unremarkable. No pancreatic ductal dilatation or surrounding inflammatory changes. Spleen: Normal in size without focal abnormality. Adrenals/Urinary Tract: No adrenal lesions are identified. No hydronephrosis or stones identified. Urinary bladder wall thickening is nonspecific can be seen with inflammation or underdistention. Stomach/Bowel: Evaluation for bowel pathology was limited by lack of GI contrast. No bowel dilatation to suggest obstruction. Unremarkable appendix. Vascular/Lymphatic: Aortic atherosclerosis. No enlarged abdominal or pelvic lymph nodes. Reproductive: Prostate is unremarkable. Other: Diffuse subcutaneous and mesenteric edema consistent with anasarca. Musculoskeletal: Thoracolumbar degenerative changes. There is anterior wedge compression deformity of L1 which is new since 2014 and age-indeterminate. IMPRESSION: 1. Abnormal gallbladder with adjacent fluid and inflammatory changes. 2. Hepatic hypodensities with peripheral enhancement that could represent abscesses in the setting of infection although a neoplastic process cannot be excluded. 3. Nonspecific urinary bladder wall thickening. 4. Anasarca. 5. Age-indeterminate L1 compression deformity and thoracolumbar degenerative changes. Electronically Signed   By: Sammie Bench M.D.   On: 06/09/2022 11:41    EKG: Independently reviewed.  NSR with rate 80; nonspecific ST changes with no evidence of acute ischemia   Labs on Admission: I have personally reviewed the available labs and imaging studies at the time of the admission.  Pertinent labs:    Na++ 132 K+ 3.3 Glucose 142 Bili 1.5 WBC 11.3 UA: 20 ketones, 30 protein   Assessment and Plan: Principal  Problem:   Acute cholecystitis Active Problems:   Cocaine abuse (HCC)   Hyperlipidemia   S/P CABG x 3   Bipolar 1 disorder   Essential hypertension   Tobacco user   Type 2 diabetes mellitus without complications   COPD with chronic bronchitis   Chronic back pain    Acute cholecystitis -Patient presenting with acute onset of RUQ/midepigastric pain -CT with abnormal gallbladder with adjacent fluid and inflammatory changes; also with hepatic hypodensities that could represent abscesses, neoplasm -RUQ [place] also c/w acute cholecystitis -Clear liquids, NPO after midnight -Morphine for pain, Zofran for nausea -Surgery is consulting - it appears that they are recommending perc drain at this time given the degree of inflammation present -Empiric coverage with Zosyn for now   CAD -s/p CABG --No current CP -Resume ASA, has not been taking -Continue to hold Plavix  Bipolar (? Schizophrenia) -Was taking Seroquel, trazodone with last med rec -Appears to take Seroquel sometimes -Resume both  Chronic back pain -I  have reviewed this patient in the [place] Controlled Substances Reporting System.  He is receiving medications from only one provider and appears to be taking them as prescribed. -He is not routinely taking opiates (although he does use cocaine) -He is not at particularly high risk of opioid misuse, diversion, or overdose.   Cocaine abuse -Reports using monthly, last use 2 days ago -Cessation encouraged  COPD with acute exacerbation -Has current acute cough with sputum changes -No wheezing -Will treat with PO Doxy + prednisone and albuterol/Duonebs -resume Symbicort  DM -Will check A1c -hold Glucophage (not taking anyway) -Cover with moderate-scale SSI   HTN -Not taking Coreg, will resume  HLD -Not taking atorvastatin, will resume -Not taking Zetia, will continue to hold  Tobacco dependence -Encourage cessation.   -This was discussed with the patient and should be  reviewed on an ongoing basis.   -Patch ordered at patient request.     Advance Care Planning:   Code Status: Full Code - Code status was discussed with the patient and/or family at the time of admission.  The patient would want to receive full resuscitative measures at this time.   Consults: Surgery  DVT Prophylaxis: SCDs  Family Communication: None present; I attempted to reach his brother by telephone without success at the time of admission  Severity of Illness: The appropriate patient status for this patient is INPATIENT. Inpatient status is judged to be reasonable and necessary in order to provide the required intensity of service to ensure the patient's safety. The patient's presenting symptoms, physical exam findings, and initial radiographic and laboratory data in the context of their chronic comorbidities is felt to place them at high risk for further clinical deterioration. Furthermore, it is not anticipated that the patient will be medically stable for discharge from the hospital within 2 midnights of admission.   * I certify that at the point of admission it is my clinical judgment that the patient will require inpatient hospital care spanning beyond 2 midnights from the point of admission due to high intensity of service, high risk for further deterioration and high frequency of surveillance required.*  Author: Karmen Bongo, MD 06/09/2022 3:28 PM  For on call review [email or web address].

## 2022-06-09 NOTE — ED Triage Notes (Addendum)
Pt BIB [facility] from [facility]. Per EMS patient has been c/o of right upper abdominal pain started yesterday. Pt denies nausea, vomiting, diarrhea or constipation. EMS reported patient have been urinating and eating fine. Pt c/o tenderness and guarding of the area. The pain is 7/10. Per EMS he c/o shortness of breath. BP 160/110, pulse 70, Saturation 99% on RA. Blood Sugar 154. Patient is A & O x 4.

## 2022-06-09 NOTE — Consult Note (Addendum)
Cardiology Consultation   Patient ID: Andre Rojas MRN: [id number]; DOB: [birth year]  Admit date: 06/09/2022 Date of Consult: 06/09/2022  PCP:  Elsie Stain, MD   [facility] Cardiologist:  Freada Bergeron, MD   {   Patient Profile:   Andre Rojas is a 72 y.o. male with a hx of CAD s/p CABG 2017, bipolar disorder, COPD, DMII, peripheral vascular disease, schizophrenia, cocaine abuse, HTN, HLD, remote CVA about 20+ years ago then in 05/2020, and tobacco abuse who is being seen 06/09/2022 for the evaluation of cardiac preoperative evaluation at the request of Dr. Lorin Mercy.  History of Present Illness:   Andre Rojas has cardiac history significant for coronary artery disease status post CABG (2017) x 3 utilizing LIMA to LAD, SVG to PDA, SVG to ramus intermediate. He had initially been admitted in [place] with chest pain but discharged to have surgery here in [place] due to [facility] Medicaid. He had no prior 2D echo in our system but TEE during that time noted an EF of 60% with no regional wall motion abnormalities. His last office visit was in 04/2020 and noted he was doing well with no complaints and ambulating a mile with no difficulties. He was on higher dose (325mg ) ASA due to history of stroke. After last cardiology OV, he was admitted 05/2020 with recurrent stroke in setting of cocaine use; advised to take 325mg  ASA + Plavix x 3 months then Plavix alone given intracranial large vessel occlusion. However, it is unclear what medicines he's actually taking presently.  Patient states he routinely sees a healthcare provider each month however unable to view any's visit notes.  Patient states he remains sedentary most days with little activity.  He states he has a bad knee and therefore ambulates very little by his assistance with a cane. He states that he often is short of breath with minimal activity and could not walk up a flight of steps.  However he believes he could walk half a  mile with his cane.  Patient also states he actively uses cocaine and last used about 3 days ago.  He states that he smokes about a gram at a time and does so every month once a month. At night patient also states that he has orthopnea which is also exacerbated his GERD, but denies any peripheral edema. Patient denies any history of chest pain, swelling in his legs, dizziness, lightheadedness, heart palpitations.  Today he presented to the ED with complaints of severe epigastric/right upper quadrant abdominal pain with nausea and vomiting.  Ultrasound noted acute cholecystitis and a CT noting possible abscess and infection as well as possible anasarca.  Cardiology has been asked to preoperatively clear him for his upcoming cholecystectomy.  Labs significant for potassium 3.3 and sodium of 132, previously be due to volume loss caused by vomiting.  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   Erectile dysfunction    Essential hypertension 03/20/2020   Gastro-esophageal reflux disease without esophagitis 03/20/2020   History of non-ST elevation myocardial infarction (NSTEMI) 06/26/2015   06/01/2015 in [place]    Hyperlipidemia    Lives in homeless shelter 03/20/2020  Pain in joint, pelvic region and thigh 03/20/2020   Peripheral vascular disease    right leg stays cold   Pneumonia    Psychosis 10/16/2012   S/P CABG x 3 06/29/2015   Tobacco user 03/20/2020    Past Surgical History:  Procedure Laterality Date   CARDIAC CATHETERIZATION  05/2015   COLONOSCOPY     CORONARY ARTERY BYPASS GRAFT N/A 06/29/2015   Procedure: CORONARY ARTERY BYPASS GRAFTING (CABG) TIMES THREE USING LEFT INTERNAL MAMMARY ARTERY AND RIGHT GREATER  SAPHENOUS VEIN HARVESTED BY ENDOVEIN;  Surgeon: Melrose Nakayama, MD;  Location: [place];  Service: Open Heart Surgery;  Laterality: N/A;   KNEE SURGERY Left    TEE WITHOUT CARDIOVERSION N/A 06/29/2015   Procedure: TRANSESOPHAGEAL ECHOCARDIOGRAM (TEE);  Surgeon: Melrose Nakayama, MD;  Location: [place];  Service: Open Heart Surgery;  Laterality: N/A;     Inpatient Medications: Scheduled Meds:  aspirin EC  325 mg Oral Daily   atorvastatin  80 mg Oral q1800   carvedilol  12.5 mg Oral BID WC   doxycycline  100 mg Oral Q12H   insulin aspart  0-15 Units Subcutaneous TID WC   mometasone-formoterol  2 puff Inhalation BID   nicotine  14 mg Transdermal Daily   predniSONE  20 mg Oral Q breakfast   QUEtiapine  100 mg Oral QHS   Continuous Infusions:  lactated ringers 75 mL/hr at 06/09/22 1433   piperacillin-tazobactam (ZOSYN)  IV     PRN Meds: acetaminophen **OR** acetaminophen, albuterol, hydrALAZINE, ipratropium-albuterol, morphine injection, ondansetron **OR** ondansetron (ZOFRAN) IV, oxyCODONE, traZODone  Allergies:    Allergies  Allergen Reactions   Glucophage [Metformin] Other (See Comments)    Weight loss    Social History:   Social History   Socioeconomic History   Marital status: Single    Spouse name: Not on file   Number of children: Not on file   Years of education: Not on file   Highest education level: Not on file  Occupational History   Not on file  Tobacco Use   Smoking status: Every Day    Packs/day: 1.00    Years: 40.00    Additional pack years: 0.00    Total pack years: 40.00    Types: Cigarettes   Smokeless tobacco: Never   Tobacco comments:    Currently 4 cigs/week  Vaping Use   Vaping Use: Never used  Substance and Sexual Activity   Alcohol use: Not Currently    Comment: heavy alcohol use, none in about 6 months   Drug use: Not Currently    Types: "Crack" cocaine, Cocaine    Comment: last use 2 days ago, uses about 1 time per month   Sexual  activity: Yes    Birth control/protection: Condom  Other Topics Concern   Not on file  Social History Narrative   Not on file   Social Determinants of Health   Financial Resource Strain: Not on file  Food Insecurity: Not on file  Transportation Needs: Not on file  Physical Activity: Not on file  Stress: Not on file  Social Connections: Not on file  Intimate Partner Violence: Not on file    Family History:   Family History  Problem Relation Age of Onset   Cancer Mother        stomach   Heart disease Father      ROS:  Please see the history of present illness.  All other ROS reviewed and negative.  Physical Exam/Data:   Vitals:   06/09/22 1433 06/09/22 1445 06/09/22 1513 06/09/22 1551  BP: (!) 189/160 (!) 176/108 (!) 167/91 (!) 169/89  Pulse: 82 (!) 107 88 81  Resp: (!) 26 15 16 17   Temp: 98.1 F (36.7 C)  98.2 F (36.8 C) (!) 97.5 F (36.4 C)  TempSrc: Oral  Oral Oral  SpO2: 100% 100%  100%  Weight:      Height:        Intake/Output Summary (Last 24 hours) at 06/09/2022 1628 Last data filed at 06/09/2022 1607 Gross per 24 hour  Intake 102.17 ml  Output --  Net 102.17 ml      06/09/2022    8:21 AM 08/14/2021    7:26 PM 08/16/2020   10:56 AM  Last 3 Weights  Weight (lbs) 143 lb 155 lb 152 lb  Weight (kg) 64.864 kg 70.308 kg 68.947 kg     Body mass index is 20.52 kg/m.  General: In pain with mild guarding HEENT: normal Neck: no JVD Vascular: No carotid bruits; Distal pulses 2+ bilaterally Cardiac:  normal S1, S2; RRR; no murmur  Lungs:  clear to auscultation bilaterally, no wheezing, rhonchi or rales  Abd: soft, nontender, no hepatomegaly  Ext: no edema Musculoskeletal:  No deformities, BUE and BLE strength normal and equal Skin: warm and dry  Neuro:  CNs 2-12 intact, no focal abnormalities noted Psych:  Normal affect   EKG:  The EKG was personally reviewed and demonstrates: Normal sinus rhythm with no ischemic changes. Telemetry:  Telemetry was  personally reviewed and demonstrates: Normal sinus rhythm with a heart rate of 90  Relevant CV Studies: TEE 2017 Left ventricle: The cavity size was normal. Wall thickness was    increased. There was concentric hypertrophy. Systolic function    was normal. The estimated ejection fraction was = 60%. Wall    motion was normal; there were no regional wall motion    abnormalities.  - Aortic valve: Normal-sized, mildly calcified annulus. Trileaflet;    normal thickness leaflets.  - Mitral valve: Valve area by continuity equation (using LVOT    flow): 3.74 cm^2.  - Left atrium: No evidence of thrombus in the atrial cavity or    appendage.  - Atrial septum: No defect or patent foramen ovale was identified.   Laboratory Data:  High Sensitivity Troponin:  No results for input(s): "TROPONINIHS" in the last 720 hours.   Chemistry Recent Labs  Lab 06/09/22 0851  NA 132*  K 3.3*  CL 98  CO2 23  GLUCOSE 142*  BUN 6*  CREATININE 0.67  CALCIUM 8.7*  GFRNONAA >60  ANIONGAP 11    Recent Labs  Lab 06/09/22 0851  PROT 7.0  ALBUMIN 3.6  AST 27  ALT 13  ALKPHOS 112  BILITOT 1.5*   Lipids No results for input(s): "CHOL", "TRIG", "HDL", "LABVLDL", "LDLCALC", "CHOLHDL" in the last 168 hours.  Hematology Recent Labs  Lab 06/09/22 0851  WBC 11.3*  RBC 4.90  HGB 13.4  HCT 41.0  MCV 83.7  MCH 27.3  MCHC 32.7  RDW 14.2  PLT 233   Thyroid No results for input(s): "TSH", "FREET4" in the last 168 hours.  BNPNo results for input(s): "BNP", "PROBNP" in the last 168 hours.  DDimer No results for input(s): "DDIMER" in the last 168 hours.   Radiology/Studies:  US Abdomen Limited RUQ (LIVER/GB)  Result Date: 06/09/2022 CLINICAL DATA:  Right upper quadrant pain EXAM: ULTRASOUND ABDOMEN LIMITED RIGHT UPPER QUADRANT COMPARISON:  CT 06/09/2022 FINDINGS: Gallbladder: Multiple echogenic, shadowing stones within the gallbladder measuring up to 1.9 cm in diameter. Diffuse gallbladder wall  thickening of 0.6 cm. A positive sonographic Murphy sign noted by sonographer. Common bile duct: Diameter: 3 mm. Liver: No focal lesion or intraparenchymal fluid collection identified with attention to the gallbladder fossa. Within normal limits in parenchymal echogenicity. Portal vein is patent on color Doppler imaging with normal direction of blood flow towards the liver. Other: None. IMPRESSION: 1. Sonographic findings compatible with acute cholecystitis. 2. No focal lesion or intraparenchymal fluid collection identified with attention to the gallbladder fossa to correspond to abnormality seen on the prior CT. Electronically Signed   By: Davina Poke D.O.   On: 06/09/2022 13:48   CT ABDOMEN PELVIS W CONTRAST  Result Date: 06/09/2022 CLINICAL DATA:  Right upper quadrant pain. EXAM: CT ABDOMEN AND PELVIS WITH CONTRAST TECHNIQUE: Multidetector CT imaging of the abdomen and pelvis was performed using the standard protocol following bolus administration of intravenous contrast. RADIATION DOSE REDUCTION: This exam was performed according to the departmental dose-optimization program which includes automated exposure control, adjustment of the mA and/or kV according to patient size and/or use of iterative reconstruction technique. CONTRAST:  88mL OMNIPAQUE IOHEXOL 350 MG/ML SOLN COMPARISON:  08/07/2012 FINDINGS: Lower chest: Cardiomegaly.  No pericardial or pleural effusion. Hepatobiliary: Grossly abnormal appearance of the gallbladder with pericholecystic fluid and areas of hypoattenuation surrounded by enhancement in the adjacent hepatic parenchyma that could represent abscesses. There are few peripherally enhancing lesions in the liver that could be abscesses in the setting of cholecystitis. A neoplastic etiology is not excluded. Pancreas: Unremarkable. No pancreatic ductal dilatation or surrounding inflammatory changes. Spleen: Normal in size without focal abnormality. Adrenals/Urinary Tract: No adrenal  lesions are identified. No hydronephrosis or stones identified. Urinary bladder wall thickening is nonspecific can be seen with inflammation or underdistention. Stomach/Bowel: Evaluation for bowel pathology was limited by lack of GI contrast. No bowel dilatation to suggest obstruction. Unremarkable appendix. Vascular/Lymphatic: Aortic atherosclerosis. No enlarged abdominal or pelvic lymph nodes. Reproductive: Prostate is unremarkable. Other: Diffuse subcutaneous and mesenteric edema consistent with anasarca. Musculoskeletal: Thoracolumbar degenerative changes. There is anterior wedge compression deformity of L1 which is new since 2014 and age-indeterminate. IMPRESSION: 1. Abnormal gallbladder with adjacent fluid and inflammatory changes. 2. Hepatic hypodensities with peripheral enhancement that could represent abscesses in the setting of infection although a neoplastic process cannot be excluded. 3. Nonspecific urinary bladder wall thickening. 4. Anasarca. 5. Age-indeterminate L1 compression deformity and thoracolumbar degenerative changes. Electronically Signed   By: Sammie Bench M.D.   On: 06/09/2022 11:41     Assessment and Plan:   Preoperative evaluation for cholecystectomy Patient with significant cardiac history and multiple comorbidities with lack of cardiac followup.  Patient has a RCRI score of 3 indicating a class 4 risk with a 15% 30-day of MACE (risk of surgery in case needing to convert to open, ischemic heart disease, stroke).  In addition to this patient is a known user of cocaine, has decreased METs (<4), and hyponatremic/hypokalemic; suggesting higher risk not measured by the calculator. Also has anasarca on CT imaging. Anticipate echocardiogram to start. However, per discussion with Dr. Harl Bowie, patient felt to be at increased baseline risk for surgery due to reasons above. Will help to manage BP. Pt was intended to be on Plavix 75mg  monotherapy prior to admission, can resume when safe  after procedure.  See below for BP recs.   CABG (2017) x 3 utilizing LIMA to LAD,  SVG to PDA, SVG to ramus intermediate Denies any current or recent history of chest pain.  Patient uncertain of which medications he takes there is a question of compliance.  He still currently smokes but is limiting himself to 4 cigarettes a week. Currently on atorvastatin 80mg  daily. Plavix as above  Hypertension Patient's blood pressure during admission has remained persistently elevated.  Has recently gotten a dose of hydralazine 10 mg, however blood pressure still elevated, currently 169/89. Per d/w Dr. Lorin Mercy, [place] for patient to take oral meds - Dr. Harl Bowie suggested he may need 25mg  BID but IM concerned that he was not taking 12.5mg  at all at home, therefore restart 12.5mg  BID and follow BP, titrate if needed If BP remains poorly controlled would anticipate adding amlodipine Continue IV hydralazine 10mg  PRN  Hyperlipidemia On atorvastatin 80 mg daily PTA  CVA x2 On Plavix 75 mg daily, however uncertain of compliance.  Last dose suspected to be 4 days ago. Can continue plavix 75mg  monotherapy after procedure.   Anasarca Noted on CT, also with complaints of orthopnea Would recommend updating echo while inpatient  Hypokalemia Electrolyte management per primary team  Risk Assessment/Risk Scores:     For questions or updates, please contact [place] Please consult [email or web address] for contact info under    Signed, Bonnee Quin, PA-C  06/09/2022 4:28 PM

## 2022-06-09 NOTE — Plan of Care (Signed)
  Problem: Skin Integrity: Goal: Risk for impaired skin integrity will decrease Outcome: Progressing   Problem: Tissue Perfusion: Goal: Adequacy of tissue perfusion will improve Outcome: Progressing   Problem: Activity: Goal: Risk for activity intolerance will decrease Outcome: Progressing   Problem: Pain Managment: Goal: General experience of comfort will improve Outcome: Progressing   Problem: Safety: Goal: Ability to remain free from injury will improve Outcome: Progressing   Problem: Skin Integrity: Goal: Risk for impaired skin integrity will decrease Outcome: Progressing   

## 2022-06-10 ENCOUNTER — Inpatient Hospital Stay (HOSPITAL_COMMUNITY): Payer: Medicare HMO

## 2022-06-10 DIAGNOSIS — I517 Cardiomegaly: Secondary | ICD-10-CM

## 2022-06-10 DIAGNOSIS — I358 Other nonrheumatic aortic valve disorders: Secondary | ICD-10-CM

## 2022-06-10 DIAGNOSIS — Z0189 Encounter for other specified special examinations: Secondary | ICD-10-CM

## 2022-06-10 DIAGNOSIS — K810 Acute cholecystitis: Secondary | ICD-10-CM | POA: Diagnosis not present

## 2022-06-10 DIAGNOSIS — R601 Generalized edema: Secondary | ICD-10-CM

## 2022-06-10 HISTORY — PX: IR PERC CHOLECYSTOSTOMY: IMG2326

## 2022-06-10 LAB — COMPREHENSIVE METABOLIC PANEL
ALT: 13 U/L (ref 0–44)
AST: 27 U/L (ref 15–41)
Albumin: 3.3 g/dL — ABNORMAL LOW (ref 3.5–5.0)
Alkaline Phosphatase: 122 U/L (ref 38–126)
Anion gap: 11 (ref 5–15)
BUN: <5 mg/dL — ABNORMAL LOW (ref 8–23)
CO2: 25 mmol/L (ref 22–32)
Calcium: 8.9 mg/dL (ref 8.9–10.3)
Chloride: 94 mmol/L — ABNORMAL LOW (ref 98–111)
Creatinine, Ser: 0.87 mg/dL (ref 0.61–1.24)
GFR, Estimated: >60 mL/min (ref >60)
Glucose, Bld: 112 mg/dL — ABNORMAL HIGH (ref 70–99)
Potassium: 3.6 mmol/L (ref 3.5–5.1)
Sodium: 130 mmol/L — ABNORMAL LOW (ref 135–145)
Total Bilirubin: 2.5 mg/dL — ABNORMAL HIGH (ref 0.3–1.2)
Total Protein: 7 g/dL (ref 6.5–8.1)

## 2022-06-10 LAB — ECHOCARDIOGRAM COMPLETE
AR max vel: 2.45 cm2
AV Area VTI: 2.55 cm2
AV Area mean vel: 2.46 cm2
AV Mean grad: 3 mmHg
AV Peak grad: 5.6 mmHg
Ao pk vel: 1.18 m/s
Area-P 1/2: 2.93 cm2
Calc EF: 50.5 %
Height: 70 in
S' Lateral: 2.3 cm
Single Plane A2C EF: 58.3 %
Single Plane A4C EF: 41.8 %
Weight: 2288 oz

## 2022-06-10 LAB — GLUCOSE, CAPILLARY
Glucose-Capillary: 131 mg/dL — ABNORMAL HIGH (ref 70–99)
Glucose-Capillary: 118 mg/dL — ABNORMAL HIGH (ref 70–99)
Glucose-Capillary: 125 mg/dL — ABNORMAL HIGH (ref 70–99)
Glucose-Capillary: 85 mg/dL (ref 70–99)
Glucose-Capillary: 127 mg/dL — ABNORMAL HIGH (ref 70–99)
Glucose-Capillary: 129 mg/dL — ABNORMAL HIGH (ref 70–99)

## 2022-06-10 LAB — CBC
HCT: 44.5 % (ref 39.0–52.0)
Hemoglobin: 15.3 g/dL (ref 13.0–17.0)
MCH: 27.7 pg (ref 26.0–34.0)
MCHC: 34.4 g/dL (ref 30.0–36.0)
MCV: 80.5 fL (ref 80.0–100.0)
Platelets: 283 10*3/uL (ref 150–400)
RBC: 5.53 MIL/uL (ref 4.22–5.81)
RDW: 13.9 % (ref 11.5–15.5)
WBC: 17.7 10*3/uL — ABNORMAL HIGH (ref 4.0–10.5)
nRBC: 0 % (ref 0.0–0.2)

## 2022-06-10 LAB — PROTIME-INR
INR: 1.2 (ref 0.8–1.2)
Prothrombin Time: 14.8 seconds (ref 11.4–15.2)

## 2022-06-10 LAB — HEMOGLOBIN A1C
Hgb A1c MFr Bld: 6.1 % — ABNORMAL HIGH (ref 4.8–5.6)
Mean Plasma Glucose: 128 mg/dL

## 2022-06-10 MED ORDER — FENTANYL CITRATE (PF) 100 MCG/2ML IJ SOLN
INTRAMUSCULAR | Status: AC | PRN
  Administered 2022-06-10: 50 ug via INTRAVENOUS

## 2022-06-10 MED ORDER — IPRATROPIUM-ALBUTEROL 0.5-2.5 (3) MG/3ML IN SOLN: 3.0000 mL | RESPIRATORY_TRACT | Status: DC | PRN

## 2022-06-10 MED ORDER — CEFAZOLIN SODIUM-DEXTROSE 2-4 GM/100ML-% IV SOLN
INTRAVENOUS | Status: AC
  Filled 2022-06-10: qty 100

## 2022-06-10 MED ORDER — CEFAZOLIN SODIUM-DEXTROSE 2-4 GM/100ML-% IV SOLN: 2.0000 g | INTRAVENOUS | Status: DC

## 2022-06-10 MED ORDER — IOHEXOL 300 MG/ML  SOLN
50.0000 mL | Freq: Once | INTRAMUSCULAR | Status: AC | PRN
  Administered 2022-06-10: 5 mL

## 2022-06-10 MED ORDER — SENNOSIDES-DOCUSATE SODIUM 8.6-50 MG PO TABS: 1.0000 | ORAL_TABLET | Freq: Every evening | ORAL | Status: DC | PRN

## 2022-06-10 MED ORDER — LACTATED RINGERS IV BOLUS
250.0000 mL | Freq: Once | INTRAVENOUS | Status: AC
  Administered 2022-06-10: 250 mL via INTRAVENOUS

## 2022-06-10 MED ORDER — MIDAZOLAM HCL 2 MG/2ML IJ SOLN
INTRAMUSCULAR | Status: AC | PRN
  Administered 2022-06-10: 1 mg via INTRAVENOUS

## 2022-06-10 MED ORDER — CEFAZOLIN SODIUM-DEXTROSE 2-4 GM/100ML-% IV SOLN
INTRAVENOUS | Status: AC | PRN
  Administered 2022-06-10: 2 g via INTRAVENOUS

## 2022-06-10 MED ORDER — FENTANYL CITRATE (PF) 100 MCG/2ML IJ SOLN
INTRAMUSCULAR | Status: AC
  Filled 2022-06-10: qty 2

## 2022-06-10 MED ORDER — TRAZODONE HCL 50 MG PO TABS: 50.0000 mg | ORAL_TABLET | Freq: Every evening | ORAL | Status: DC | PRN

## 2022-06-10 MED ORDER — LIDOCAINE HCL 1 % IJ SOLN
INTRAMUSCULAR | Status: AC
  Administered 2022-06-10: 10 mL
  Filled 2022-06-10: qty 20

## 2022-06-10 MED ORDER — MIDAZOLAM HCL 2 MG/2ML IJ SOLN
INTRAMUSCULAR | Status: AC
  Filled 2022-06-10: qty 2

## 2022-06-10 MED ORDER — GUAIFENESIN 100 MG/5ML PO LIQD: 5.0000 mL | ORAL | Status: DC | PRN

## 2022-06-10 MED ORDER — SODIUM CHLORIDE 0.9 % IV SOLN
100.0000 mg | Freq: Two times a day (BID) | INTRAVENOUS | Status: DC
  Administered 2022-06-10 (×2): 100 mg via INTRAVENOUS
  Filled 2022-06-10 (×3): qty 100

## 2022-06-10 NOTE — Progress Notes (Signed)
PROGRESS NOTE    Andre Rojas  [id number] DOB: April 25, 1950 DOA: 06/09/2022 PCP: Elsie Stain, MD   Brief Narrative:  72 y.o. male with medical history significant of bipolar d/o/schizophrenia, chronic back pain, cocaine dependence, CAD s/p CABG, COPD, DM, HTN, HLD, and homelessness presenting with abdominal pain. His workup is concerning for acute cholecystitis.  CT A/P showed abnormal gallbladder with adjacent fluid and inflammatory changes with hepatic hypodensities with peripheral enhancement c/w abscesses (although a neoplastic process cannot be excluded). RUQ US showed multiple gallstones measuring up to 1.9 cm with diffuse gallbladder wall thickening of 0.6 cm.  Recommend IR percutaneous Cholecystostomy Tube given the degree of inflammation on imaging.  Cardiology was requested for medical clearance.   Assessment & Plan:  Principal Problem:   Acute cholecystitis Active Problems:   Cocaine abuse (HCC)   Hyperlipidemia   S/P CABG x 3   Bipolar 1 disorder   Essential hypertension   Tobacco user   Type 2 diabetes mellitus without complications   COPD with chronic bronchitis   Chronic back pain     Acute cholecystitis status post cholecystostomy 4/1 -Seen by general surgery.  Given degree of inflammation, drain placement by IR -On Empiric Zosyn   COPD with acute exacerbation In mild exacerbation currently on bronchodilators, steroids and nebs On empiric doxycycline, okay to change to p.o. once able to tolerate   CAD s/p CABG -Currently chest pain-free.  Continue home medications.  Antiplatelets are on hold   Bipolar (? Schizophrenia) -Continue home medications but they can be held while n.p.o.   Chronic back pain Pain control as needed   Cocaine abuse Last cocaine use about 2 days ago.  Counseled to quit using this   DM A1c 6.1. Sliding scle and accuchecks.    HTN On Coreg.    HLD Statin   Tobacco dependence Counseled to quit using it.    DVT  prophylaxis: SCDs Start: 06/09/22 1354 Code Status: Full  Family Communication:   Status is: Inpatient Ongoing management for cholecystitis with IV antibiotics.  Continue hospital stay     Diet Orders (From admission, onward)     Start     Ordered   06/10/22 1130  Diet clear liquid Fluid consistency: Thin  Diet effective now       Question:  Fluid consistency:  Answer:  Thin   06/10/22 1129            Subjective:  Abdominal pain is improved.  Denies any nausea vomiting, chest pain  Examination:  General exam: Appears calm and comfortable  Respiratory system: Clear to auscultation. Respiratory effort normal. Cardiovascular system: S1 & S2 heard, RRR. No JVD, murmurs, rubs, gallops or clicks. No pedal edema. Gastrointestinal system: Abdomen is nondistended, soft and nontender. No organomegaly or masses felt. Normal bowel sounds heard. Central nervous system: Alert and oriented. No focal neurological deficits. Extremities: Symmetric 5 x 5 power. Skin: No rashes, lesions or ulcers Psychiatry: Judgement and insight appear normal. Mood & affect appropriate.  Right upper quadrant drain in place  Objective: Vitals:   06/10/22 0853 06/10/22 1023 06/10/22 1025 06/10/22 1034  BP:  121/80 122/86 116/78  Pulse:  85 87 85  Resp:  16 18 16   Temp:      TempSrc:      SpO2: 96% 98% 99% 100%  Weight:      Height:        Intake/Output Summary (Last 24 hours) at 06/10/2022 1209 Last data filed at 06/10/2022  0843 Gross per 24 hour  Intake 682.88 ml  Output --  Net 682.88 ml   Filed Weights   06/09/22 0821  Weight: 64.9 kg    Scheduled Meds:  atorvastatin  80 mg Oral q1800   carvedilol  12.5 mg Oral BID WC   insulin aspart  0-15 Units Subcutaneous TID WC   mometasone-formoterol  2 puff Inhalation BID   nicotine  14 mg Transdermal Daily   predniSONE  20 mg Oral Q breakfast   QUEtiapine  100 mg Oral QHS   Continuous Infusions:  doxycycline (VIBRAMYCIN) IV 100 mg (06/10/22  1135)   lactated ringers 75 mL/hr at 06/09/22 1433   piperacillin-tazobactam (ZOSYN)  IV 3.375 g (06/10/22 0508)    Nutritional status     Body mass index is 20.52 kg/m.  Data Reviewed:   CBC: Recent Labs  Lab 06/09/22 0851 06/10/22 0530  WBC 11.3* 17.7*  NEUTROABS 9.1*  --   HGB 13.4 15.3  HCT 41.0 44.5  MCV 83.7 80.5  PLT 233 [id number]   Basic Metabolic Panel: Recent Labs  Lab 06/09/22 0851 06/10/22 0530  NA 132* 130*  K 3.3* 3.6  CL 98 94*  CO2 23 25  GLUCOSE 142* 112*  BUN 6* <5*  CREATININE 0.67 0.87  CALCIUM 8.7* 8.9   GFR: Estimated Creatinine Clearance: 73.6 mL/min (by C-G formula based on SCr of 0.87 mg/dL). Liver Function Tests: Recent Labs  Lab 06/09/22 0851 06/10/22 0530  AST 27 27  ALT 13 13  ALKPHOS 112 122  BILITOT 1.5* 2.5*  PROT 7.0 7.0  ALBUMIN 3.6 3.3*   Recent Labs  Lab 06/09/22 0851  LIPASE 21   No results for input(s): "AMMONIA" in the last 168 hours. Coagulation Profile: Recent Labs  Lab 06/09/22 1616 06/10/22 0530  INR 1.1 1.2   Cardiac Enzymes: No results for input(s): "CKTOTAL", "CKMB", "CKMBINDEX", "TROPONINI" in the last 168 hours. BNP (last 3 results) No results for input(s): "PROBNP" in the last 8760 hours. HbA1C: Recent Labs    06/09/22 1616  HGBA1C 6.1*   CBG: Recent Labs  Lab 06/09/22 2017 06/10/22 0111 06/10/22 0428 06/10/22 0827 06/10/22 1148  GLUCAP 133* 131* 118* 125* 85   Lipid Profile: No results for input(s): "CHOL", "HDL", "LDLCALC", "TRIG", "CHOLHDL", "LDLDIRECT" in the last 72 hours. Thyroid Function Tests: No results for input(s): "TSH", "T4TOTAL", "FREET4", "T3FREE", "THYROIDAB" in the last 72 hours. Anemia Panel: No results for input(s): "VITAMINB12", "FOLATE", "FERRITIN", "TIBC", "IRON", "RETICCTPCT" in the last 72 hours. Sepsis Labs: No results for input(s): "PROCALCITON", "LATICACIDVEN" in the last 168 hours.  No results found for this or any previous visit (from the past 240  hour(s)).       Radiology Studies: IR Perc Cholecystostomy  Result Date: 06/10/2022 INDICATION: ACUTE CALCULUS CHOLECYSTITIS EXAM: Ultrasound and fluoroscopic percutaneous cholecystostomy MEDICATIONS: Patient is already receiving IV Zosyn as an inpatient; The antibiotic was administered within an appropriate time frame prior to the initiation of the procedure. ANESTHESIA/SEDATION: Moderate (conscious) sedation was employed during this procedure. A total of Versed 1.0 mg and Fentanyl 50 mcg was administered intravenously by the radiology nurse. Total intra-service moderate Sedation Time: 12 minutes. The patient's level of consciousness and vital signs were monitored continuously by radiology nursing throughout the procedure under my direct supervision. FLUOROSCOPY: Radiation Exposure Index (as provided by the fluoroscopic device): 1.0 mGy Kerma COMPLICATIONS: None immediate. PROCEDURE: Informed written consent was obtained from the patient after a thorough discussion of the procedural risks, benefits  and alternatives. All questions were addressed. Maximal Sterile Barrier Technique was utilized including caps, mask, sterile gowns, sterile gloves, sterile drape, hand hygiene and skin antiseptic. A timeout was performed prior to the initiation of the procedure. Previous imaging reviewed. Preliminary ultrasound performed. The gallbladder was localized in the mid axillary line through a lower intercostal space. Overlying skin marked. Under sterile conditions and local anesthesia, ultrasound percutaneous needle access performed of the gallbladder. Needle position confirmed with ultrasound. Images obtained for documentation. There was return of bile. Sample sent for culture. Guidewire inserted followed by the transitional dilator set. Amplatz guidewire advanced. Tract dilatation performed to insert a 10 [place] drain. Retention loop formed in the gallbladder proximally. Position confirmed with ultrasound and  fluoroscopy. 30 cc exudative bile aspirated. Catheter secured with a Prolene suture. Sterile dressing applied. Gravity drainage bag connected. Patient tolerated the procedure well. IMPRESSION: Successful ultrasound and fluoroscopic cholecystostomy placement. Electronically Signed   By: Jerilynn Mages.  Shick M.D.   On: 06/10/2022 10:42   US Abdomen Limited RUQ (LIVER/GB)  Result Date: 06/09/2022 CLINICAL DATA:  Right upper quadrant pain EXAM: ULTRASOUND ABDOMEN LIMITED RIGHT UPPER QUADRANT COMPARISON:  CT 06/09/2022 FINDINGS: Gallbladder: Multiple echogenic, shadowing stones within the gallbladder measuring up to 1.9 cm in diameter. Diffuse gallbladder wall thickening of 0.6 cm. A positive sonographic Murphy sign noted by sonographer. Common bile duct: Diameter: 3 mm. Liver: No focal lesion or intraparenchymal fluid collection identified with attention to the gallbladder fossa. Within normal limits in parenchymal echogenicity. Portal vein is patent on color Doppler imaging with normal direction of blood flow towards the liver. Other: None. IMPRESSION: 1. Sonographic findings compatible with acute cholecystitis. 2. No focal lesion or intraparenchymal fluid collection identified with attention to the gallbladder fossa to correspond to abnormality seen on the prior CT. Electronically Signed   By: Davina Poke D.O.   On: 06/09/2022 13:48   CT ABDOMEN PELVIS W CONTRAST  Result Date: 06/09/2022 CLINICAL DATA:  Right upper quadrant pain. EXAM: CT ABDOMEN AND PELVIS WITH CONTRAST TECHNIQUE: Multidetector CT imaging of the abdomen and pelvis was performed using the standard protocol following bolus administration of intravenous contrast. RADIATION DOSE REDUCTION: This exam was performed according to the departmental dose-optimization program which includes automated exposure control, adjustment of the mA and/or kV according to patient size and/or use of iterative reconstruction technique. CONTRAST:  2mL OMNIPAQUE IOHEXOL 350  MG/ML SOLN COMPARISON:  08/07/2012 FINDINGS: Lower chest: Cardiomegaly.  No pericardial or pleural effusion. Hepatobiliary: Grossly abnormal appearance of the gallbladder with pericholecystic fluid and areas of hypoattenuation surrounded by enhancement in the adjacent hepatic parenchyma that could represent abscesses. There are few peripherally enhancing lesions in the liver that could be abscesses in the setting of cholecystitis. A neoplastic etiology is not excluded. Pancreas: Unremarkable. No pancreatic ductal dilatation or surrounding inflammatory changes. Spleen: Normal in size without focal abnormality. Adrenals/Urinary Tract: No adrenal lesions are identified. No hydronephrosis or stones identified. Urinary bladder wall thickening is nonspecific can be seen with inflammation or underdistention. Stomach/Bowel: Evaluation for bowel pathology was limited by lack of GI contrast. No bowel dilatation to suggest obstruction. Unremarkable appendix. Vascular/Lymphatic: Aortic atherosclerosis. No enlarged abdominal or pelvic lymph nodes. Reproductive: Prostate is unremarkable. Other: Diffuse subcutaneous and mesenteric edema consistent with anasarca. Musculoskeletal: Thoracolumbar degenerative changes. There is anterior wedge compression deformity of L1 which is new since 2014 and age-indeterminate. IMPRESSION: 1. Abnormal gallbladder with adjacent fluid and inflammatory changes. 2. Hepatic hypodensities with peripheral enhancement that could represent abscesses in the  setting of infection although a neoplastic process cannot be excluded. 3. Nonspecific urinary bladder wall thickening. 4. Anasarca. 5. Age-indeterminate L1 compression deformity and thoracolumbar degenerative changes. Electronically Signed   By: Sammie Bench M.D.   On: 06/09/2022 11:41           LOS: 1 day   Time spent= 35 mins    Xareni Kelch Arsenio Loader, MD [facility]  If 7PM-7AM, please contact night-coverage  06/10/2022, 12:09  PM

## 2022-06-10 NOTE — Progress Notes (Signed)
Clear liquid tray ordered 

## 2022-06-10 NOTE — Plan of Care (Signed)
S/p perc chol via IR. No acute EKG findings. Cardiology will sign off.

## 2022-06-10 NOTE — Progress Notes (Signed)
Patient transferred to IR to get perc drain placed via bed by transportation staff.

## 2022-06-10 NOTE — Progress Notes (Signed)
Patient arrived back to [place] room 29 alert and oriented, pain level 3/10. Per drain in place in RUQ. Bed in lowest position. Call light in reach. Bed alarm on. Will continue to monitor pt.

## 2022-06-10 NOTE — Progress Notes (Signed)
Progress Note     Subjective: Still drowsy from IR drain placement. Having some soreness at drain site. No n/v   Objective: Vital signs in last 24 hours: Temp:  [97.5 F (36.4 C)-99.1 F (37.3 C)] 99.1 F (37.3 C) (04/01 2016) Pulse Rate:  [81-107] 85 (04/02 1034) Resp:  [15-26] 16 (04/02 1034) BP: (116-189)/(78-160) 116/78 (04/02 1034) SpO2:  [96 %-100 %] 100 % (04/02 1034) Last BM Date : 06/09/22  Intake/Output from previous day: 04/01 0701 - 04/02 0700 In: 682.9 [I.V.:682.9] Out: -  Intake/Output this shift: No intake/output data recorded.  PE: General: pleasant, WD, male who is laying in bed in NAD Lungs:   Respiratory effort nonlabored on room air Abd: soft, ND, +BS, mild expected TTP around perc chole drain which has thin bile tinged SS fluid in bag and tubing MSK: all 4 extremities are symmetrical with no cyanosis, clubbing, or edema. Skin: warm and dry Psych: A&Ox3 with an appropriate affect.    Lab Results:  Recent Labs    06/09/22 0851 06/10/22 0530  WBC 11.3* 17.7*  HGB 13.4 15.3  HCT 41.0 44.5  PLT 233 283   BMET Recent Labs    06/09/22 0851 06/10/22 0530  NA 132* 130*  K 3.3* 3.6  CL 98 94*  CO2 23 25  GLUCOSE 142* 112*  BUN 6* <5*  CREATININE 0.67 0.87  CALCIUM 8.7* 8.9   PT/INR Recent Labs    06/09/22 1616 06/10/22 0530  LABPROT 13.7 14.8  INR 1.1 1.2   CMP     Component Value Date/Time   NA 130 (L) 06/10/2022 0530   NA 140 08/16/2020 1218   K 3.6 06/10/2022 0530   CL 94 (L) 06/10/2022 0530   CO2 25 06/10/2022 0530   GLUCOSE 112 (H) 06/10/2022 0530   BUN <5 (L) 06/10/2022 0530   BUN 6 (L) 08/16/2020 1218   CREATININE 0.87 06/10/2022 0530   CALCIUM 8.9 06/10/2022 0530   PROT 7.0 06/10/2022 0530   PROT 6.4 08/16/2020 1218   ALBUMIN 3.3 (L) 06/10/2022 0530   ALBUMIN 4.1 08/16/2020 1218   AST 27 06/10/2022 0530   ALT 13 06/10/2022 0530   ALKPHOS 122 06/10/2022 0530   BILITOT 2.5 (H) 06/10/2022 0530   BILITOT 0.8  08/16/2020 1218   GFRNONAA >60 06/10/2022 0530   GFRAA 100 03/20/2020 1631   Lipase     Component Value Date/Time   LIPASE 21 06/09/2022 0851       Studies/Results: US Abdomen Limited RUQ (LIVER/GB)  Result Date: 06/09/2022 CLINICAL DATA:  Right upper quadrant pain EXAM: ULTRASOUND ABDOMEN LIMITED RIGHT UPPER QUADRANT COMPARISON:  CT 06/09/2022 FINDINGS: Gallbladder: Multiple echogenic, shadowing stones within the gallbladder measuring up to 1.9 cm in diameter. Diffuse gallbladder wall thickening of 0.6 cm. A positive sonographic Murphy sign noted by sonographer. Common bile duct: Diameter: 3 mm. Liver: No focal lesion or intraparenchymal fluid collection identified with attention to the gallbladder fossa. Within normal limits in parenchymal echogenicity. Portal vein is patent on color Doppler imaging with normal direction of blood flow towards the liver. Other: None. IMPRESSION: 1. Sonographic findings compatible with acute cholecystitis. 2. No focal lesion or intraparenchymal fluid collection identified with attention to the gallbladder fossa to correspond to abnormality seen on the prior CT. Electronically Signed   By: Davina Poke D.O.   On: 06/09/2022 13:48   CT ABDOMEN PELVIS W CONTRAST  Result Date: 06/09/2022 CLINICAL DATA:  Right upper quadrant pain. EXAM: CT ABDOMEN  AND PELVIS WITH CONTRAST TECHNIQUE: Multidetector CT imaging of the abdomen and pelvis was performed using the standard protocol following bolus administration of intravenous contrast. RADIATION DOSE REDUCTION: This exam was performed according to the departmental dose-optimization program which includes automated exposure control, adjustment of the mA and/or kV according to patient size and/or use of iterative reconstruction technique. CONTRAST:  33mL OMNIPAQUE IOHEXOL 350 MG/ML SOLN COMPARISON:  08/07/2012 FINDINGS: Lower chest: Cardiomegaly.  No pericardial or pleural effusion. Hepatobiliary: Grossly abnormal appearance  of the gallbladder with pericholecystic fluid and areas of hypoattenuation surrounded by enhancement in the adjacent hepatic parenchyma that could represent abscesses. There are few peripherally enhancing lesions in the liver that could be abscesses in the setting of cholecystitis. A neoplastic etiology is not excluded. Pancreas: Unremarkable. No pancreatic ductal dilatation or surrounding inflammatory changes. Spleen: Normal in size without focal abnormality. Adrenals/Urinary Tract: No adrenal lesions are identified. No hydronephrosis or stones identified. Urinary bladder wall thickening is nonspecific can be seen with inflammation or underdistention. Stomach/Bowel: Evaluation for bowel pathology was limited by lack of GI contrast. No bowel dilatation to suggest obstruction. Unremarkable appendix. Vascular/Lymphatic: Aortic atherosclerosis. No enlarged abdominal or pelvic lymph nodes. Reproductive: Prostate is unremarkable. Other: Diffuse subcutaneous and mesenteric edema consistent with anasarca. Musculoskeletal: Thoracolumbar degenerative changes. There is anterior wedge compression deformity of L1 which is new since 2014 and age-indeterminate. IMPRESSION: 1. Abnormal gallbladder with adjacent fluid and inflammatory changes. 2. Hepatic hypodensities with peripheral enhancement that could represent abscesses in the setting of infection although a neoplastic process cannot be excluded. 3. Nonspecific urinary bladder wall thickening. 4. Anasarca. 5. Age-indeterminate L1 compression deformity and thoracolumbar degenerative changes. Electronically Signed   By: Sammie Bench M.D.   On: 06/09/2022 11:41    Anti-infectives: Anti-infectives (From admission, onward)    Start     Dose/Rate Route Frequency Ordered Stop   06/10/22 1021  ceFAZolin (ANCEF) IVPB 2g/100 mL premix        over 30 Minutes Intravenous Continuous PRN 06/10/22 1024     06/10/22 1015  ceFAZolin (ANCEF) IVPB 2g/100 mL premix  Status:   Discontinued        2 g 200 mL/hr over 30 Minutes Intravenous To Radiology 06/10/22 0926 06/10/22 1018   06/10/22 1000  doxycycline (VIBRAMYCIN) 100 mg in sodium chloride 0.9 % 250 mL IVPB        100 mg 125 mL/hr over 120 Minutes Intravenous Every 12 hours 06/10/22 0904     06/09/22 2200  piperacillin-tazobactam (ZOSYN) IVPB 3.375 g        3.375 g 12.5 mL/hr over 240 Minutes Intravenous Every 8 hours 06/09/22 1234     06/09/22 1530  doxycycline (VIBRA-TABS) tablet 100 mg  Status:  Discontinued        100 mg Oral Every 12 hours 06/09/22 1527 06/10/22 0904   06/09/22 1245  piperacillin-tazobactam (ZOSYN) IVPB 3.375 g        3.375 g 100 mL/hr over 30 Minutes Intravenous  Once 06/09/22 1233 06/09/22 1315        Assessment/Plan  Acute cholecystitis - s/p perc chole by IR today 4/2  - WBC 17.7 (11.3). continue zosyn for now and follow culture results - advance to CLD  FEN: CLD ID: [email or web address], doxycycline (COPD exacerbation) VTE: SCDs, okay for chemical ppx from surgical perspective, resume plavix per IR reccs  Per primary Age indeterminite L1 compression fx CVA on Plavix (he is unsure if he is still taking this but reports the last time  he took any medications was 4d ago) Hx HTN Hx HLD PVD Hx CAD s/p CABG x 3 COPD w/ tobacco use Bipolar disorder Schizophrenia Cocaine use (last use 3d ago)  I reviewed Consultant IR notes, hospitalist notes, last 24 h vitals and pain scores, last 48 h intake and output, last 24 h labs and trends, and last 24 h imaging results.    LOS: 1 day   [facility] 06/10/2022, 10:43 AM Please see Amion for pager number during day hours 7:00am-4:30pm

## 2022-06-10 NOTE — Procedures (Signed)
Interventional Radiology Procedure Note  Procedure: CHOLECYSTOSTOMY    Complications: None  Estimated Blood Loss:  0  Findings: EXUDATIVE BILE ASP CX SENT    Tamera Punt, MD

## 2022-06-11 DIAGNOSIS — K810 Acute cholecystitis: Secondary | ICD-10-CM | POA: Diagnosis not present

## 2022-06-11 LAB — MAGNESIUM: Magnesium: 1.7 mg/dL (ref 1.7–2.4)

## 2022-06-11 LAB — CBC
HCT: 36.9 % — ABNORMAL LOW (ref 39.0–52.0)
Hemoglobin: 12.7 g/dL — ABNORMAL LOW (ref 13.0–17.0)
MCH: 27.7 pg (ref 26.0–34.0)
MCHC: 34.4 g/dL (ref 30.0–36.0)
MCV: 80.4 fL (ref 80.0–100.0)
Platelets: 216 10*3/uL (ref 150–400)
RBC: 4.59 MIL/uL (ref 4.22–5.81)
RDW: 14.1 % (ref 11.5–15.5)
WBC: 12.7 10*3/uL — ABNORMAL HIGH (ref 4.0–10.5)
nRBC: 0 % (ref 0.0–0.2)

## 2022-06-11 LAB — COMPREHENSIVE METABOLIC PANEL
ALT: 9 U/L (ref 0–44)
AST: 21 U/L (ref 15–41)
Albumin: 2.6 g/dL — ABNORMAL LOW (ref 3.5–5.0)
Alkaline Phosphatase: 84 U/L (ref 38–126)
Anion gap: 9 (ref 5–15)
BUN: 9 mg/dL (ref 8–23)
CO2: 25 mmol/L (ref 22–32)
Calcium: 8.4 mg/dL — ABNORMAL LOW (ref 8.9–10.3)
Chloride: 98 mmol/L (ref 98–111)
Creatinine, Ser: 0.83 mg/dL (ref 0.61–1.24)
GFR, Estimated: >60 mL/min (ref >60)
Glucose, Bld: 102 mg/dL — ABNORMAL HIGH (ref 70–99)
Potassium: 3.3 mmol/L — ABNORMAL LOW (ref 3.5–5.1)
Sodium: 132 mmol/L — ABNORMAL LOW (ref 135–145)
Total Bilirubin: 2.6 mg/dL — ABNORMAL HIGH (ref 0.3–1.2)
Total Protein: 5.6 g/dL — ABNORMAL LOW (ref 6.5–8.1)

## 2022-06-11 LAB — GLUCOSE, CAPILLARY
Glucose-Capillary: 94 mg/dL (ref 70–99)
Glucose-Capillary: 106 mg/dL — ABNORMAL HIGH (ref 70–99)
Glucose-Capillary: 196 mg/dL — ABNORMAL HIGH (ref 70–99)
Glucose-Capillary: 154 mg/dL — ABNORMAL HIGH (ref 70–99)

## 2022-06-11 MED ORDER — DOXYCYCLINE HYCLATE 100 MG PO TABS
100.0000 mg | ORAL_TABLET | Freq: Two times a day (BID) | ORAL | Status: DC
  Administered 2022-06-11 – 2022-06-13 (×5): 100 mg via ORAL
  Filled 2022-06-11 (×5): qty 1

## 2022-06-11 MED ORDER — POTASSIUM CHLORIDE 20 MEQ PO PACK
40.0000 meq | PACK | Freq: Once | ORAL | Status: AC
  Administered 2022-06-11: 40 meq via ORAL
  Filled 2022-06-11: qty 2

## 2022-06-11 MED ORDER — LACTATED RINGERS IV BOLUS
500.0000 mL | Freq: Once | INTRAVENOUS | Status: AC
  Administered 2022-06-11: 500 mL via INTRAVENOUS

## 2022-06-11 MED ORDER — POTASSIUM CHLORIDE 10 MEQ/100ML IV SOLN
10.0000 meq | INTRAVENOUS | Status: AC
  Administered 2022-06-11 (×4): 10 meq via INTRAVENOUS
  Filled 2022-06-11 (×4): qty 100

## 2022-06-11 MED ORDER — LACTATED RINGERS IV BOLUS
250.0000 mL | Freq: Once | INTRAVENOUS | Status: AC
  Administered 2022-06-11: 250 mL via INTRAVENOUS

## 2022-06-11 NOTE — TOC Initial Note (Signed)
Transition of Care [facility]) - Initial/Assessment Note    Patient Details  Name: Andre Rojas MRN: [id number] Date of Birth: Feb 08, 1951  Transition of Care [facility]) CM/SW Contact:    Coralee Pesa, [place] Phone Number: 06/11/2022, 2:10 PM  Clinical Narrative:                 Per chart review, pt is from the [facility], has a history of poly substance use. CSW met with pt at bedside and pt confirms he has been homeless for about 3 years. Pt is tearful throughout assessment and states the [facility] is "rough" and he does not feel comfortable there. Pt confirms food insecurity and last Cocaine use was a week ago. Pt is agreeable for resources, and wants treatment for substance use.   SU resources and shelter list were provided at bedside. Pt was advised that he needs to initiate contact with facilities, but CSW will assist as needed. CSW added additional resources to AVS for pt to have at DC. TOC will continue to follow for additional needs.   Expected Discharge Plan: Homeless Shelter Barriers to Discharge: Continued Medical Work up   Patient Goals and CMS Choice Patient states their goals for this hospitalization and ongoing recovery are:: Pt states he wants to feel better.   Choice offered to / list presented to : Patient      Expected Discharge Plan and Services     Post Acute Care Choice:  (Shelter) Living arrangements for the past 2 months: Homeless Shelter                                      Prior Living Arrangements/Services Living arrangements for the past 2 months: Homeless Shelter Lives with:: Other (Comment) (Shelter bed) Patient language and need for interpreter reviewed:: Yes Do you feel safe going back to the place where you live?: No   From the [facility]  Need for Family Participation in Patient Care: No (Comment) Care giver support system in place?: No (comment)   Criminal Activity/Legal Involvement Pertinent to Current Situation/Hospitalization: No - Comment as  needed  Activities of Daily Living      Permission Sought/Granted Permission sought to share information with : Family Supports Permission granted to share information with : No              Emotional Assessment Appearance:: Appears stated age Attitude/Demeanor/Rapport: Engaged Affect (typically observed): Tearful/Crying Orientation: : Oriented to Self, Oriented to Place, Oriented to  Time, Oriented to Situation Alcohol / Substance Use: Illicit Drugs Psych Involvement: No (comment)  Admission diagnosis:  Acute cholecystitis [K81.0] RUQ pain [R10.11] Patient Active Problem List   Diagnosis Date Noted   Acute cholecystitis 06/09/2022   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 08/16/2020   Nonadherence to medication    History of stroke 05/11/2020   Erectile dysfunction    Chronic back pain    CAD (coronary artery disease)    Arthritis    Anemia    Bipolar 1 disorder 03/20/2020   Gastro-esophageal reflux disease without esophagitis 03/20/2020   Essential hypertension 03/20/2020   Peripheral vascular disease 03/20/2020   Tobacco user 03/20/2020   Type 2 diabetes mellitus without complications [id number]   COPD with chronic bronchitis 03/20/2020   Disorganized schizophrenia 06/16/2018   S/P CABG x 3 06/29/2015   Coronary artery disease 06/26/2015   History of non-ST elevation myocardial infarction (NSTEMI)  06/26/2015   Hyperlipidemia 06/26/2015   Cocaine abuse ([place]) 10/16/2012   Psychosis 10/16/2012   PCP:  Elsie Stain, MD Pharmacy:   [id number] - [place], [place] [address] Phone: [phone] Fax: [phone]  [place], [place] [address] [place] [address] Phone: [phone] Fax: [phone]  [facility] [place], [address] [place] [address] Phone: [phone]  Fax: [phone]     Social Determinants of Health (SDOH) Social History: SDOH Screenings   Alcohol Screen: Low Risk  (08/15/2020)  Depression (PHQ2-9): Low Risk  (08/15/2020)  Tobacco Use: High Risk (06/10/2022)   SDOH Interventions:     Readmission Risk Interventions     No data to display

## 2022-06-11 NOTE — Progress Notes (Addendum)
Patient ID: Andre Rojas, male   DOB: Sep 14, 1950, 72 y.o.   MRN: [id number]      Subjective: Reports he feels better ROS negative except as listed above. Objective: Vital signs in last 24 hours: Temp:  [98 F (36.7 C)-100.5 F (38.1 C)] 98.6 F (37 C) (04/03 0457) Pulse Rate:  [65-89] 65 (04/03 0457) Resp:  [16-18] 16 (04/03 0457) BP: (53-122)/(39-86) 104/53 (04/03 0457) SpO2:  [95 %-100 %] 97 % (04/03 0457) Last BM Date : 06/09/22  Intake/Output from previous day: 04/02 0701 - 04/03 0700 In: 723 [P.O.:360; IV Piggyback:363] Out: 370 [Urine:220; Drains:150] Intake/Output this shift: No intake/output data recorded.  General appearance: alert and cooperative Resp: clear to auscultation bilaterally GI: soft, tender at drain site, bile coming out of perc chole drain  Lab Results: CBC  Recent Labs    06/10/22 0530 06/11/22 0314  WBC 17.7* 12.7*  HGB 15.3 12.7*  HCT 44.5 36.9*  PLT 283 216   BMET Recent Labs    06/10/22 0530 06/11/22 0314  NA 130* 132*  K 3.6 3.3*  CL 94* 98  CO2 25 25  GLUCOSE 112* 102*  BUN <5* 9  CREATININE 0.87 0.83  CALCIUM 8.9 8.4*   PT/INR Recent Labs    06/09/22 1616 06/10/22 0530  LABPROT 13.7 14.8  INR 1.1 1.2   ABG No results for input(s): "PHART", "HCO3" in the last 72 hours.  Invalid input(s): "PCO2", "PO2"  Studies/Results: ECHOCARDIOGRAM COMPLETE  Result Date: 06/10/2022    ECHOCARDIOGRAM REPORT   Patient Name:   Andre Rojas Date of Exam: 06/10/2022 Medical Rec #:  [id number]      Height:       70.0 in Accession #:    [id number]     Weight:       143.0 lb Date of Birth:  06/25/1950       BSA:          1.810 m Patient Age:    70 years       BP:           142/86 mmHg Patient Gender: M              HR:           91 bpm. Exam Location:  Inpatient Procedure: 2D Echo, Color Doppler, Cardiac Doppler and Strain Analysis Indications:    Preop eval, Anasarca  History:        Patient has prior history of Echocardiogram examinations,  most                 recent 06/29/2015. CAD and Previous Myocardial Infarction, COPD;                 Risk Factors:Dyslipidemia, Hypertension, Current Smoker and                 Diabetes. Cocaine Abuse, CABG x3,.  Sonographer:    Rolla Etienne Referring Phys: [id number] Dougherty  1. Severe biventricular hypertrophy with septal thickness 23 mm at greatest and decreased global longitudinal strain spairing the true apex. Left ventricular ejection fraction, by estimation, is 30 to 35%. The left ventricle has moderately decreased function. The left ventricle demonstrates global hypokinesis. There is severe concentric left ventricular hypertrophy. Left ventricular diastolic parameters are indeterminate. The average left ventricular global longitudinal strain is -4.6 %. The global longitudinal strain is abnormal.  2. Right ventricular systolic function is moderately reduced. The right ventricular size is normal. Moderately  increased right ventricular wall thickness. Tricuspid regurgitation signal is inadequate for assessing PA pressure.  3. The mitral valve is abnormal. No evidence of mitral valve regurgitation. No evidence of mitral stenosis.  4. The aortic valve is calcified. There is mild calcification of the aortic valve. Aortic valve regurgitation is not visualized. No aortic stenosis is present. Comparison(s): No prior Echocardiogram. Conclusion(s)/Recommendation(s): Severe biventricular hypertrophy with cardiomyopathy. Consider outpatient evaluation for infiltrative disease. FINDINGS  Left Ventricle: Severe biventricular hypertrophy with septal thickness 23 mm at greatest and decreased global longitudinal strain spairing the true apex. Left ventricular ejection fraction, by estimation, is 30 to 35%. The left ventricle has moderately decreased function. The left ventricle demonstrates global hypokinesis. The average left ventricular global longitudinal strain is -4.6 %. The global longitudinal strain is  abnormal. The left ventricular internal cavity size was small. There is severe concentric left ventricular hypertrophy. Left ventricular diastolic parameters are indeterminate. Right Ventricle: The right ventricular size is normal. Moderately increased right ventricular wall thickness. Right ventricular systolic function is moderately reduced. Tricuspid regurgitation signal is inadequate for assessing PA pressure. Left Atrium: Left atrial size was normal in size. Right Atrium: Right atrial size was normal in size. Pericardium: There is no evidence of pericardial effusion. Mitral Valve: The mitral valve is abnormal. No evidence of mitral valve regurgitation. No evidence of mitral valve stenosis. Tricuspid Valve: The tricuspid valve is normal in structure. Tricuspid valve regurgitation is not demonstrated. No evidence of tricuspid stenosis. Aortic Valve: The aortic valve is calcified. There is mild calcification of the aortic valve. Aortic valve regurgitation is not visualized. No aortic stenosis is present. Aortic valve mean gradient measures 3.0 mmHg. Aortic valve peak gradient measures 5.6 mmHg. Aortic valve area, by VTI measures 2.55 cm. Pulmonic Valve: The pulmonic valve was normal in structure. Pulmonic valve regurgitation is not visualized. No evidence of pulmonic stenosis. Aorta: The aortic root is normal in size and structure. IAS/Shunts: No atrial level shunt detected by color flow Doppler.  LEFT VENTRICLE PLAX 2D LVIDd:         3.20 cm     Diastology LVIDs:         2.30 cm     LV e' medial:    4.68 cm/s LV PW:         2.30 cm     LV E/e' medial:  10.3 LV IVS:        2.30 cm     LV e' lateral:   7.72 cm/s LVOT diam:     2.10 cm     LV E/e' lateral: 6.3 LV SV:         42 LV SV Index:   23          2D Longitudinal Strain LVOT Area:     3.46 cm    2D Strain GLS Avg:     -4.6 %  LV Volumes (MOD) LV vol d, MOD A2C: 41.2 ml LV vol d, MOD A4C: 46.6 ml LV vol s, MOD A2C: 17.2 ml LV vol s, MOD A4C: 27.1 ml LV SV  MOD A2C:     24.0 ml LV SV MOD A4C:     46.6 ml LV SV MOD BP:      21.8 ml RIGHT VENTRICLE RV Basal diam:  2.50 cm RV Mid diam:    1.90 cm RV S prime:     8.92 cm/s TAPSE (M-mode): 0.8 cm LEFT ATRIUM  Index        RIGHT ATRIUM           Index LA diam:        4.30 cm 2.38 cm/m   RA Area:     13.70 cm LA Vol (A2C):   53.8 ml 29.72 ml/m  RA Volume:   32.10 ml  17.74 ml/m LA Vol (A4C):   36.8 ml 20.33 ml/m LA Biplane Vol: 47.2 ml 26.08 ml/m  AORTIC VALVE AV Area (Vmax):    2.45 cm AV Area (Vmean):   2.46 cm AV Area (VTI):     2.55 cm AV Vmax:           118.00 cm/s AV Vmean:          83.300 cm/s AV VTI:            0.163 m AV Peak Grad:      5.6 mmHg AV Mean Grad:      3.0 mmHg LVOT Vmax:         83.60 cm/s LVOT Vmean:        59.200 cm/s LVOT VTI:          0.120 m LVOT/AV VTI ratio: 0.74  AORTA Ao Root diam: 3.40 cm MITRAL VALVE MV Area (PHT): 2.93 cm    SHUNTS MV Decel Time: 259 msec    Systemic VTI:  0.12 m MV E velocity: 48.40 cm/s  Systemic Diam: 2.10 cm MV A velocity: 48.40 cm/s MV E/A ratio:  1.00 Rudean Haskell MD Electronically signed by Rudean Haskell MD Signature Date/Time: 06/10/2022/12:28:03 PM    Final    IR Perc Cholecystostomy  Result Date: 06/10/2022 INDICATION: ACUTE CALCULUS CHOLECYSTITIS EXAM: Ultrasound and fluoroscopic percutaneous cholecystostomy MEDICATIONS: Patient is already receiving IV Zosyn as an inpatient; The antibiotic was administered within an appropriate time frame prior to the initiation of the procedure. ANESTHESIA/SEDATION: Moderate (conscious) sedation was employed during this procedure. A total of Versed 1.0 mg and Fentanyl 50 mcg was administered intravenously by the radiology nurse. Total intra-service moderate Sedation Time: 12 minutes. The patient's level of consciousness and vital signs were monitored continuously by radiology nursing throughout the procedure under my direct supervision. FLUOROSCOPY: Radiation Exposure Index (as provided by the  fluoroscopic device): 1.0 mGy Kerma COMPLICATIONS: None immediate. PROCEDURE: Informed written consent was obtained from the patient after a thorough discussion of the procedural risks, benefits and alternatives. All questions were addressed. Maximal Sterile Barrier Technique was utilized including caps, mask, sterile gowns, sterile gloves, sterile drape, hand hygiene and skin antiseptic. A timeout was performed prior to the initiation of the procedure. Previous imaging reviewed. Preliminary ultrasound performed. The gallbladder was localized in the mid axillary line through a lower intercostal space. Overlying skin marked. Under sterile conditions and local anesthesia, ultrasound percutaneous needle access performed of the gallbladder. Needle position confirmed with ultrasound. Images obtained for documentation. There was return of bile. Sample sent for culture. Guidewire inserted followed by the transitional dilator set. Amplatz guidewire advanced. Tract dilatation performed to insert a 10 [place] drain. Retention loop formed in the gallbladder proximally. Position confirmed with ultrasound and fluoroscopy. 30 cc exudative bile aspirated. Catheter secured with a Prolene suture. Sterile dressing applied. Gravity drainage bag connected. Patient tolerated the procedure well. IMPRESSION: Successful ultrasound and fluoroscopic cholecystostomy placement. Electronically Signed   By: Jerilynn Mages.  Shick M.D.   On: 06/10/2022 10:42   US Abdomen Limited RUQ (LIVER/GB)  Result Date: 06/09/2022 CLINICAL DATA:  Right upper quadrant pain EXAM: ULTRASOUND  ABDOMEN LIMITED RIGHT UPPER QUADRANT COMPARISON:  CT 06/09/2022 FINDINGS: Gallbladder: Multiple echogenic, shadowing stones within the gallbladder measuring up to 1.9 cm in diameter. Diffuse gallbladder wall thickening of 0.6 cm. A positive sonographic Murphy sign noted by sonographer. Common bile duct: Diameter: 3 mm. Liver: No focal lesion or intraparenchymal fluid collection  identified with attention to the gallbladder fossa. Within normal limits in parenchymal echogenicity. Portal vein is patent on color Doppler imaging with normal direction of blood flow towards the liver. Other: None. IMPRESSION: 1. Sonographic findings compatible with acute cholecystitis. 2. No focal lesion or intraparenchymal fluid collection identified with attention to the gallbladder fossa to correspond to abnormality seen on the prior CT. Electronically Signed   By: Davina Poke D.O.   On: 06/09/2022 13:48   CT ABDOMEN PELVIS W CONTRAST  Result Date: 06/09/2022 CLINICAL DATA:  Right upper quadrant pain. EXAM: CT ABDOMEN AND PELVIS WITH CONTRAST TECHNIQUE: Multidetector CT imaging of the abdomen and pelvis was performed using the standard protocol following bolus administration of intravenous contrast. RADIATION DOSE REDUCTION: This exam was performed according to the departmental dose-optimization program which includes automated exposure control, adjustment of the mA and/or kV according to patient size and/or use of iterative reconstruction technique. CONTRAST:  63mL OMNIPAQUE IOHEXOL 350 MG/ML SOLN COMPARISON:  08/07/2012 FINDINGS: Lower chest: Cardiomegaly.  No pericardial or pleural effusion. Hepatobiliary: Grossly abnormal appearance of the gallbladder with pericholecystic fluid and areas of hypoattenuation surrounded by enhancement in the adjacent hepatic parenchyma that could represent abscesses. There are few peripherally enhancing lesions in the liver that could be abscesses in the setting of cholecystitis. A neoplastic etiology is not excluded. Pancreas: Unremarkable. No pancreatic ductal dilatation or surrounding inflammatory changes. Spleen: Normal in size without focal abnormality. Adrenals/Urinary Tract: No adrenal lesions are identified. No hydronephrosis or stones identified. Urinary bladder wall thickening is nonspecific can be seen with inflammation or underdistention. Stomach/Bowel:  Evaluation for bowel pathology was limited by lack of GI contrast. No bowel dilatation to suggest obstruction. Unremarkable appendix. Vascular/Lymphatic: Aortic atherosclerosis. No enlarged abdominal or pelvic lymph nodes. Reproductive: Prostate is unremarkable. Other: Diffuse subcutaneous and mesenteric edema consistent with anasarca. Musculoskeletal: Thoracolumbar degenerative changes. There is anterior wedge compression deformity of L1 which is new since 2014 and age-indeterminate. IMPRESSION: 1. Abnormal gallbladder with adjacent fluid and inflammatory changes. 2. Hepatic hypodensities with peripheral enhancement that could represent abscesses in the setting of infection although a neoplastic process cannot be excluded. 3. Nonspecific urinary bladder wall thickening. 4. Anasarca. 5. Age-indeterminate L1 compression deformity and thoracolumbar degenerative changes. Electronically Signed   By: Sammie Bench M.D.   On: 06/09/2022 11:41    Anti-infectives: Anti-infectives (From admission, onward)    Start     Dose/Rate Route Frequency Ordered Stop   06/10/22 1021  ceFAZolin (ANCEF) IVPB 2g/100 mL premix        over 30 Minutes Intravenous Continuous PRN 06/10/22 1024 06/10/22 1021   06/10/22 1015  ceFAZolin (ANCEF) IVPB 2g/100 mL premix  Status:  Discontinued        2 g 200 mL/hr over 30 Minutes Intravenous To Radiology 06/10/22 0926 06/10/22 1018   06/10/22 1000  doxycycline (VIBRAMYCIN) 100 mg in sodium chloride 0.9 % 250 mL IVPB        100 mg 125 mL/hr over 120 Minutes Intravenous Every 12 hours 06/10/22 0904     06/09/22 2200  piperacillin-tazobactam (ZOSYN) IVPB 3.375 g        3.375 g 12.5 mL/hr over 240 Minutes  Intravenous Every 8 hours 06/09/22 1234     06/09/22 1530  doxycycline (VIBRA-TABS) tablet 100 mg  Status:  Discontinued        100 mg Oral Every 12 hours 06/09/22 1527 06/10/22 0904   06/09/22 1245  piperacillin-tazobactam (ZOSYN) IVPB 3.375 g        3.375 g 100 mL/hr over 30  Minutes Intravenous  Once 06/09/22 1233 06/09/22 1315       Assessment/Plan:  Acute cholecystitis - s/p perc chole by IR 4/2  - WBC down. continue zosyn for now and follow culture results - advance to carb mod heart healthy diet  FEN: CLD ID: [email or web address], doxycycline (COPD exacerbation) VTE: SCDs, okay for chemical ppx from surgical perspective, resume plavix per IR reccs  Per primary Age indeterminite L1 compression fx CVA on Plavix (he is unsure if he is still taking this but reports the last time he took any medications was 4d ago) Hx HTN Hx HLD PVD Hx CAD s/p CABG x 3 COPD w/ tobacco use Bipolar disorder Schizophrenia Cocaine use (last use 3d ago)   LOS: 2 days    Georganna Skeans, MD, MPH, FACS Tra[facility] Use [email or web address] to contact on call provider  06/11/2022

## 2022-06-11 NOTE — Progress Notes (Signed)
PROGRESS NOTE    Andre Rojas  [id number] DOB: 03/10/1951 DOA: 06/09/2022 PCP: Elsie Stain, MD   Brief Narrative:  72 y.o. male with medical history significant of bipolar d/o/schizophrenia, chronic back pain, cocaine dependence, CAD s/p CABG, COPD, DM, HTN, HLD, and homelessness presenting with abdominal pain. His workup is concerning for acute cholecystitis.  CT A/P showed abnormal gallbladder with adjacent fluid and inflammatory changes with hepatic hypodensities with peripheral enhancement c/w abscesses (although a neoplastic process cannot be excluded). RUQ US showed multiple gallstones measuring up to 1.9 cm with diffuse gallbladder wall thickening of 0.6 cm.  Recommend IR percutaneous Cholecystostomy Tube given the degree of inflammation on imaging.  Cardiology was requested for medical clearance.   Assessment & Plan:  Principal Problem:   Acute cholecystitis Active Problems:   Cocaine abuse (HCC)   Hyperlipidemia   S/P CABG x 3   Bipolar 1 disorder   Essential hypertension   Tobacco user   Type 2 diabetes mellitus without complications   COPD with chronic bronchitis   Chronic back pain     Acute cholecystitis status post cholecystostomy 4/1 -Seen by general surgery.  Given degree of inflammation, drain placement by IR -On Empiric Zosyn > change to PO.  Will hold off on until cultures have resulted   COPD with acute exacerbation In mild exacerbation currently on bronchodilators, steroids and nebs On Doxycyline.    CAD s/p CABG -Currently chest pain-free.  Continue home medications.  Reusme plavix once ok with IR   Bipolar (? Schizophrenia) -Continue home medications but they can be held while n.p.o.   Chronic back pain Pain control as needed   Cocaine abuse Last cocaine use about 2 days ago.  Counseled to quit using this   DM A1c 6.1. Sliding scle and accuchecks.    HTN On Coreg.    HLD Statin   Tobacco dependence Counseled to quit using it.     DVT prophylaxis: SCDs Start: 06/09/22 1354 Code Status: Full  Family Communication:   Status is: Inpatient  Pending IR and Gen Sx clearance for dc. Waiting for Cx data.    Diet Orders (From admission, onward)     Start     Ordered   06/11/22 0742  Diet heart healthy/carb modified Room service appropriate? Yes; Fluid consistency: Thin  Diet effective now       Question Answer Comment  Diet-HS Snack? Nothing   Room service appropriate? Yes   Fluid consistency: Thin      06/11/22 0741            Subjective: Doing ok no complaints.  Low BP but no symptoms   Examination: Constitutional: Not in acute distress Respiratory: Clear to auscultation bilaterally Cardiovascular: Normal sinus rhythm, no rubs Abdomen: Nontender nondistended good bowel sounds Musculoskeletal: No edema noted Skin: No rashes seen Neurologic: CN 2-12 grossly intact.  And nonfocal Psychiatric: Normal judgment and insight. Alert and oriented x 3. Normal mood.   Right upper quadrant drain in place  Objective: Vitals:   06/11/22 0200 06/11/22 0457 06/11/22 0741 06/11/22 0837  BP: 98/64 (!) 104/53 98/77   Pulse: 72 65 71   Resp:  16 16   Temp:  98.6 F (37 C) 97.9 F (36.6 C)   TempSrc:   Oral   SpO2:  97% 98% 99%  Weight:      Height:        Intake/Output Summary (Last 24 hours) at 06/11/2022 [id number] Last data filed at 06/11/2022  0541 Gross per 24 hour  Intake 723 ml  Output 370 ml  Net 353 ml   Filed Weights   06/09/22 0821  Weight: 64.9 kg    Scheduled Meds:  atorvastatin  80 mg Oral q1800   insulin aspart  0-15 Units Subcutaneous TID WC   mometasone-formoterol  2 puff Inhalation BID   nicotine  14 mg Transdermal Daily   potassium chloride  40 mEq Oral Once   predniSONE  20 mg Oral Q breakfast   QUEtiapine  100 mg Oral QHS   Continuous Infusions:  doxycycline (VIBRAMYCIN) IV 100 mg (06/10/22 2358)   lactated ringers 75 mL/hr at 06/09/22 1433   piperacillin-tazobactam (ZOSYN)   IV 3.375 g (06/11/22 0541)   potassium chloride      Nutritional status     Body mass index is 20.52 kg/m.  Data Reviewed:   CBC: Recent Labs  Lab 06/09/22 0851 06/10/22 0530 06/11/22 0314  WBC 11.3* 17.7* 12.7*  NEUTROABS 9.1*  --   --   HGB 13.4 15.3 12.7*  HCT 41.0 44.5 36.9*  MCV 83.7 80.5 80.4  PLT 233 283 [id number]   Basic Metabolic Panel: Recent Labs  Lab 06/09/22 0851 06/10/22 0530 06/11/22 0314  NA 132* 130* 132*  K 3.3* 3.6 3.3*  CL 98 94* 98  CO2 23 25 25   GLUCOSE 142* 112* 102*  BUN 6* <5* 9  CREATININE 0.67 0.87 0.83  CALCIUM 8.7* 8.9 8.4*  MG  --   --  1.7   GFR: Estimated Creatinine Clearance: 77.1 mL/min (by C-G formula based on SCr of 0.83 mg/dL). Liver Function Tests: Recent Labs  Lab 06/09/22 0851 06/10/22 0530 06/11/22 0314  AST 27 27 21   ALT 13 13 9   ALKPHOS 112 122 84  BILITOT 1.5* 2.5* 2.6*  PROT 7.0 7.0 5.6*  ALBUMIN 3.6 3.3* 2.6*   Recent Labs  Lab 06/09/22 0851  LIPASE 21   No results for input(s): "AMMONIA" in the last 168 hours. Coagulation Profile: Recent Labs  Lab 06/09/22 1616 06/10/22 0530  INR 1.1 1.2   Cardiac Enzymes: No results for input(s): "CKTOTAL", "CKMB", "CKMBINDEX", "TROPONINI" in the last 168 hours. BNP (last 3 results) No results for input(s): "PROBNP" in the last 8760 hours. HbA1C: Recent Labs    06/09/22 1616  HGBA1C 6.1*   CBG: Recent Labs  Lab 06/10/22 0827 06/10/22 1148 06/10/22 1628 06/10/22 2116 06/11/22 0805  GLUCAP 125* 85 127* 129* 94   Lipid Profile: No results for input(s): "CHOL", "HDL", "LDLCALC", "TRIG", "CHOLHDL", "LDLDIRECT" in the last 72 hours. Thyroid Function Tests: No results for input(s): "TSH", "T4TOTAL", "FREET4", "T3FREE", "THYROIDAB" in the last 72 hours. Anemia Panel: No results for input(s): "VITAMINB12", "FOLATE", "FERRITIN", "TIBC", "IRON", "RETICCTPCT" in the last 72 hours. Sepsis Labs: No results for input(s): "PROCALCITON", "LATICACIDVEN" in the last  168 hours.  Recent Results (from the past 240 hour(s))  Aerobic/Anaerobic Culture w Gram Stain (surgical/deep wound)     Status: None (Preliminary result)   Collection Time: 06/10/22 10:37 AM   Specimen: Gallbladder; Abscess  Result Value Ref Range Status   Specimen Description GALL BLADDER  Final   Special Requests NONE  Final   Gram Stain   Final    ABUNDANT WBC PRESENT, PREDOMINANTLY PMN NO ORGANISMS SEEN    Culture   Final    NO GROWTH < 24 HOURS Performed at [facility], [address], [place], [place] [address]    Report Status PENDING  Incomplete         Radiology Studies: ECHOCARDIOGRAM COMPLETE  Result Date: 06/10/2022    ECHOCARDIOGRAM REPORT   Patient Name:   Andre Rojas Date of Exam: 06/10/2022 Medical Rec #:  [id number]      Height:       70.0 in Accession #:    [id number]     Weight:       143.0 lb Date of Birth:  1950/05/13       BSA:          1.810 m Patient Age:    47 years       BP:           142/86 mmHg Patient Gender: M              HR:           91 bpm. Exam Location:  Inpatient Procedure: 2D Echo, Color Doppler, Cardiac Doppler and Strain Analysis Indications:    Preop eval, Anasarca  History:        Patient has prior history of Echocardiogram examinations, most                 recent 06/29/2015. CAD and Previous Myocardial Infarction, COPD;                 Risk Factors:Dyslipidemia, Hypertension, Current Smoker and                 Diabetes. Cocaine Abuse, CABG x3,.  Sonographer:    Rolla Etienne Referring Phys: [id number] [place]  1. Severe biventricular hypertrophy with septal thickness 23 mm at greatest and decreased global longitudinal strain spairing the true apex. Left ventricular ejection fraction, by estimation, is 30 to 35%. The left ventricle has moderately decreased function. The left ventricle demonstrates global hypokinesis. There is severe concentric left ventricular hypertrophy. Left ventricular diastolic parameters are indeterminate.  The average left ventricular global longitudinal strain is -4.6 %. The global longitudinal strain is abnormal.  2. Right ventricular systolic function is moderately reduced. The right ventricular size is normal. Moderately increased right ventricular wall thickness. Tricuspid regurgitation signal is inadequate for assessing PA pressure.  3. The mitral valve is abnormal. No evidence of mitral valve regurgitation. No evidence of mitral stenosis.  4. The aortic valve is calcified. There is mild calcification of the aortic valve. Aortic valve regurgitation is not visualized. No aortic stenosis is present. Comparison(s): No prior Echocardiogram. Conclusion(s)/Recommendation(s): Severe biventricular hypertrophy with cardiomyopathy. Consider outpatient evaluation for infiltrative disease. FINDINGS  Left Ventricle: Severe biventricular hypertrophy with septal thickness 23 mm at greatest and decreased global longitudinal strain spairing the true apex. Left ventricular ejection fraction, by estimation, is 30 to 35%. The left ventricle has moderately decreased function. The left ventricle demonstrates global hypokinesis. The average left ventricular global longitudinal strain is -4.6 %. The global longitudinal strain is abnormal. The left ventricular internal cavity size was small. There is severe concentric left ventricular hypertrophy. Left ventricular diastolic parameters are indeterminate. Right Ventricle: The right ventricular size is normal. Moderately increased right ventricular wall thickness. Right ventricular systolic function is moderately reduced. Tricuspid regurgitation signal is inadequate for assessing PA pressure. Left Atrium: Left atrial size was normal in size. Right Atrium: Right atrial size was normal in size. Pericardium: There is no evidence of pericardial effusion. Mitral Valve: The mitral valve is abnormal. No evidence of mitral valve regurgitation. No evidence of mitral valve stenosis. Tricuspid Valve:  The tricuspid valve is normal  in structure. Tricuspid valve regurgitation is not demonstrated. No evidence of tricuspid stenosis. Aortic Valve: The aortic valve is calcified. There is mild calcification of the aortic valve. Aortic valve regurgitation is not visualized. No aortic stenosis is present. Aortic valve mean gradient measures 3.0 mmHg. Aortic valve peak gradient measures 5.6 mmHg. Aortic valve area, by VTI measures 2.55 cm. Pulmonic Valve: The pulmonic valve was normal in structure. Pulmonic valve regurgitation is not visualized. No evidence of pulmonic stenosis. Aorta: The aortic root is normal in size and structure. IAS/Shunts: No atrial level shunt detected by color flow Doppler.  LEFT VENTRICLE PLAX 2D LVIDd:         3.20 cm     Diastology LVIDs:         2.30 cm     LV e' medial:    4.68 cm/s LV PW:         2.30 cm     LV E/e' medial:  10.3 LV IVS:        2.30 cm     LV e' lateral:   7.72 cm/s LVOT diam:     2.10 cm     LV E/e' lateral: 6.3 LV SV:         42 LV SV Index:   23          2D Longitudinal Strain LVOT Area:     3.46 cm    2D Strain GLS Avg:     -4.6 %  LV Volumes (MOD) LV vol d, MOD A2C: 41.2 ml LV vol d, MOD A4C: 46.6 ml LV vol s, MOD A2C: 17.2 ml LV vol s, MOD A4C: 27.1 ml LV SV MOD A2C:     24.0 ml LV SV MOD A4C:     46.6 ml LV SV MOD BP:      21.8 ml RIGHT VENTRICLE RV Basal diam:  2.50 cm RV Mid diam:    1.90 cm RV S prime:     8.92 cm/s TAPSE (M-mode): 0.8 cm LEFT ATRIUM             Index        RIGHT ATRIUM           Index LA diam:        4.30 cm 2.38 cm/m   RA Area:     13.70 cm LA Vol (A2C):   53.8 ml 29.72 ml/m  RA Volume:   32.10 ml  17.74 ml/m LA Vol (A4C):   36.8 ml 20.33 ml/m LA Biplane Vol: 47.2 ml 26.08 ml/m  AORTIC VALVE AV Area (Vmax):    2.45 cm AV Area (Vmean):   2.46 cm AV Area (VTI):     2.55 cm AV Vmax:           118.00 cm/s AV Vmean:          83.300 cm/s AV VTI:            0.163 m AV Peak Grad:      5.6 mmHg AV Mean Grad:      3.0 mmHg LVOT Vmax:          83.60 cm/s LVOT Vmean:        59.200 cm/s LVOT VTI:          0.120 m LVOT/AV VTI ratio: 0.74  AORTA Ao Root diam: 3.40 cm MITRAL VALVE MV Area (PHT): 2.93 cm    SHUNTS MV Decel Time: 259 msec    Systemic VTI:  0.12 m  MV E velocity: 48.40 cm/s  Systemic Diam: 2.10 cm MV A velocity: 48.40 cm/s MV E/A ratio:  1.00 Rudean Haskell MD Electronically signed by Rudean Haskell MD Signature Date/Time: 06/10/2022/12:28:03 PM    Final    IR Perc Cholecystostomy  Result Date: 06/10/2022 INDICATION: ACUTE CALCULUS CHOLECYSTITIS EXAM: Ultrasound and fluoroscopic percutaneous cholecystostomy MEDICATIONS: Patient is already receiving IV Zosyn as an inpatient; The antibiotic was administered within an appropriate time frame prior to the initiation of the procedure. ANESTHESIA/SEDATION: Moderate (conscious) sedation was employed during this procedure. A total of Versed 1.0 mg and Fentanyl 50 mcg was administered intravenously by the radiology nurse. Total intra-service moderate Sedation Time: 12 minutes. The patient's level of consciousness and vital signs were monitored continuously by radiology nursing throughout the procedure under my direct supervision. FLUOROSCOPY: Radiation Exposure Index (as provided by the fluoroscopic device): 1.0 mGy Kerma COMPLICATIONS: None immediate. PROCEDURE: Informed written consent was obtained from the patient after a thorough discussion of the procedural risks, benefits and alternatives. All questions were addressed. Maximal Sterile Barrier Technique was utilized including caps, mask, sterile gowns, sterile gloves, sterile drape, hand hygiene and skin antiseptic. A timeout was performed prior to the initiation of the procedure. Previous imaging reviewed. Preliminary ultrasound performed. The gallbladder was localized in the mid axillary line through a lower intercostal space. Overlying skin marked. Under sterile conditions and local anesthesia, ultrasound percutaneous needle access  performed of the gallbladder. Needle position confirmed with ultrasound. Images obtained for documentation. There was return of bile. Sample sent for culture. Guidewire inserted followed by the transitional dilator set. Amplatz guidewire advanced. Tract dilatation performed to insert a 10 [place] drain. Retention loop formed in the gallbladder proximally. Position confirmed with ultrasound and fluoroscopy. 30 cc exudative bile aspirated. Catheter secured with a Prolene suture. Sterile dressing applied. Gravity drainage bag connected. Patient tolerated the procedure well. IMPRESSION: Successful ultrasound and fluoroscopic cholecystostomy placement. Electronically Signed   By: Jerilynn Mages.  Shick M.D.   On: 06/10/2022 10:42   US Abdomen Limited RUQ (LIVER/GB)  Result Date: 06/09/2022 CLINICAL DATA:  Right upper quadrant pain EXAM: ULTRASOUND ABDOMEN LIMITED RIGHT UPPER QUADRANT COMPARISON:  CT 06/09/2022 FINDINGS: Gallbladder: Multiple echogenic, shadowing stones within the gallbladder measuring up to 1.9 cm in diameter. Diffuse gallbladder wall thickening of 0.6 cm. A positive sonographic Murphy sign noted by sonographer. Common bile duct: Diameter: 3 mm. Liver: No focal lesion or intraparenchymal fluid collection identified with attention to the gallbladder fossa. Within normal limits in parenchymal echogenicity. Portal vein is patent on color Doppler imaging with normal direction of blood flow towards the liver. Other: None. IMPRESSION: 1. Sonographic findings compatible with acute cholecystitis. 2. No focal lesion or intraparenchymal fluid collection identified with attention to the gallbladder fossa to correspond to abnormality seen on the prior CT. Electronically Signed   By: Davina Poke D.O.   On: 06/09/2022 13:48   CT ABDOMEN PELVIS W CONTRAST  Result Date: 06/09/2022 CLINICAL DATA:  Right upper quadrant pain. EXAM: CT ABDOMEN AND PELVIS WITH CONTRAST TECHNIQUE: Multidetector CT imaging of the abdomen and  pelvis was performed using the standard protocol following bolus administration of intravenous contrast. RADIATION DOSE REDUCTION: This exam was performed according to the departmental dose-optimization program which includes automated exposure control, adjustment of the mA and/or kV according to patient size and/or use of iterative reconstruction technique. CONTRAST:  65mL OMNIPAQUE IOHEXOL 350 MG/ML SOLN COMPARISON:  08/07/2012 FINDINGS: Lower chest: Cardiomegaly.  No pericardial or pleural effusion. Hepatobiliary: Grossly abnormal appearance of  the gallbladder with pericholecystic fluid and areas of hypoattenuation surrounded by enhancement in the adjacent hepatic parenchyma that could represent abscesses. There are few peripherally enhancing lesions in the liver that could be abscesses in the setting of cholecystitis. A neoplastic etiology is not excluded. Pancreas: Unremarkable. No pancreatic ductal dilatation or surrounding inflammatory changes. Spleen: Normal in size without focal abnormality. Adrenals/Urinary Tract: No adrenal lesions are identified. No hydronephrosis or stones identified. Urinary bladder wall thickening is nonspecific can be seen with inflammation or underdistention. Stomach/Bowel: Evaluation for bowel pathology was limited by lack of GI contrast. No bowel dilatation to suggest obstruction. Unremarkable appendix. Vascular/Lymphatic: Aortic atherosclerosis. No enlarged abdominal or pelvic lymph nodes. Reproductive: Prostate is unremarkable. Other: Diffuse subcutaneous and mesenteric edema consistent with anasarca. Musculoskeletal: Thoracolumbar degenerative changes. There is anterior wedge compression deformity of L1 which is new since 2014 and age-indeterminate. IMPRESSION: 1. Abnormal gallbladder with adjacent fluid and inflammatory changes. 2. Hepatic hypodensities with peripheral enhancement that could represent abscesses in the setting of infection although a neoplastic process cannot  be excluded. 3. Nonspecific urinary bladder wall thickening. 4. Anasarca. 5. Age-indeterminate L1 compression deformity and thoracolumbar degenerative changes. Electronically Signed   By: Sammie Bench M.D.   On: 06/09/2022 11:41           LOS: 2 days   Time spent= 35 mins    Raymon Schlarb Arsenio Loader, MD [facility]  If 7PM-7AM, please contact night-coverage  06/11/2022, 9:07 AM

## 2022-06-11 NOTE — Discharge Instructions (Signed)
Sunday, by [facility] [address] [place], [place], [phone], 3.2 mi from [facility], call in advance for appointment at 10:00am or at 4:00pm, must provide valid photo ID  Monday  9:30am-5:00pm [facility], [place], [phone], 0.9 mi from [facility], can come four times per year, bring your photo ID and SS cards for other residents of household, will make appointments for those who work and need to come after 5pm  10:00am-12:00noon Hastings, [place], [place], [phone], 1.7 mi from [facility], can come once every 60 days per household, need referral from Hastings, Pacific Mutual, etc., bring photo ID and SS card   10:00am-1:00pm [employer]itor the [facility],  [id number] [address], [place], [phone], 7.7 mi from [facility], can come once every thirty days with a referral from [place], Boeing, [place], etc. -- each referral good for six visits, bring photo ID   10:00am-1:00pm [address], [place], [phone], 4.2 mi from [facility], can come once every 6 months, open to [facility] residents, bring photo ID and copy of a current utility bill in your name, please call first to verify that food is available  6:30pm-8:30pm [facility], [address] [address], [phone], 3.2 mi from [facility], can come once every 30 days, maximum 6 times per year, bring your photo ID and SS numbers for other residents of household  Monday by [place], [place], [place],  [phone], 2.5 mi from [facility], call in advance for appointment between 10:00am-2:00pm, bring your photo ID and SS cards for all residents of household, can come once every 3 months  [address], [place], [address], [phone], 0.7 mi from  [facility], call in advance for appointment, can come once every 30 days, bring your photo ID and SS cards for other residents of household   Tuesday  9:00am-12:00noon Boeing, [address] [address], [phone], 1.3 mi from [facility], can come once every 3 months, bring your photo ID and SS numbers for other residents of household   9:00am-1:00pm [place],  [place], [address], [phone]/Ext [phone], 1.6 mi from [facility], can come once every two weeks  9:30am-5:00pm Pacific Mutual, [place], [phone], 0.9 mi from [facility], can come four times per year, bring your photo ID and SS cards for other residents of household, will make appointments for those who work and need to come after 5pm  10:00am-12:00noon Safeway Inc, [place], [place], [phone], 1.7 mi from [facility], can come once every 60 days per household, need referral from [place], Pacific Mutual, etc., bring photo ID and SS card  10:00am-1:00pm Mattel, [place], [id number],  [phone], 3.8 mi from [facility], with referral from [place], may come six times, 30 days apart, bring your photo ID and SS cards for all residents of household   [place] the [facility] [place], [id number]  [address], [place], [phone], 7.7 mi from [facility], can come once every thirty days with a referral from [place],  Solicitor, Ramona, etc.- each referral good for six visits, bring photo ID   10:00am-1:00pm [address], [address] [place], [phone], 4.2 mi from [facility], can come once every 6 months, open to [facility] residents, bring photo ID and copy of a current utility bill in your name, please call first to verify that food is available  2:00pm-3:30pm [facility], [address], [phone],  [phone], 3.7 mi from [facility], can come twelve times per year, one bag per family, bring photo ID   FIRST AND THIRD Tuesdays  10:00am-1:00pm, [address] [place], [place], [phone], 7.2 mi from [facility], can come once every 30 days   Tuesday, WHEN FOOD IS AVAILABLE (call)  12:00noon-2:00pm [facility], [address], [address], [phone], 1.5 mi from  [facility], can come once every 30 days, bring photo ID   Tuesday, by APPOINTMENT ONLY  [facility], [place], [place],  [phone], 2.5 mi from [facility], call in advance for appointment between 1:00pm-4:00pm, bring your photo ID and SS cards for all residents of household, can come once every 3 months   [place]olley, [address] [place], [phone], 5 mi from [facility], call one day ahead for appointment the next day between 10:00am and 12:00noon, can come once every 3 months, bring your photo ID and must qualify according to family income   [address], [address] [place], [address], [phone], 0.7 mi from [facility], call in advance for appointment, can come once every 30 days, bring your photo ID and SS cards for other residents of household   [place], [place], [place],  [phone], 5.1 mi from [facility], call between 9:00am and 1:00pm M-F to make appointment. Appointments are scheduled for Tues and Thurs from 10:00am-11:30am, bring photo ID, can come once every 6 months, limit three visits over 18 months, then must have referral  Wednesday  9:30am-5:00pm [facility], [place], [phone], 0.9 mi from [facility], can come four times per year, bring your photo ID and SS cards for other residents of household, will make appointments for those who work and need to come after  5pm  9:30am-11:30am Walt Disney of [place] Father, [place], [address], [phone], 6.6 mi from [facility], can come once every 30 days, bring your photo ID, and SS cards for other residents of household, each monthly visit requires a written referral from GUM or DSS with number in household on form  10:00am-12:00noon [address], [place], [place], [phone], 1.7 mi from [facility], can come once every 60 days per household, need referral from [place], Pacific Mutual, etc., bring photo ID and SS card  10:00am-1:00pm Federal-Mogul, [id number],  [phone], 3.9 mi from [facility], with referral from [place], may come six times, 30 days apart, bring your photo ID and SS cards for all residents of household   Adrian the [facility], [id number]  [address], [place], [phone], 7.7 mi from [facility], can come once every thirty days with a referral from [place], Boeing, Yahoo,  etc. - each referral good for six visits, bring photo ID   2:00pm-5:45pm [address], [place], [place], [phone], 3.2 mi from [facility], once every 30 days, first come/first served, limited to first 25, bring photo ID   6:30pm-8:30pm [facility], [address], [address], [phone], 3.2 mi from [facility], can come once every 30 days, maximum 6 times per year, bring your photo ID and SS numbers for other residents of household  FIRST and THIRD Wednesdays   9:00am-12:00noon, [address] [place], [place], [phone], 4.2 mi from [facility], can come once a month. Please arrive and sign in no later than 11:15 so everyone can be served by 12 noon.  THIRD Wednesday  1:30pm-3:00pm, Mt. [address], [place], [place], [phone] or [phone], 2.1 mi from [facility]   Wednesday, by Cr[place], [address], [place], [phone], 5 mi from [facility], call one day ahead for appointment the next day between 10:00am and 12:00noon, can come once every 3 months, bring your photo ID and must qualify according to family income   [address], [place], [address], [phone], 0.7 mi from [facility], call in advance for appointment, can come once every 30 days, bring your photo ID and SS cards for other residents of household   [address] of [place], [address], [place], [phone], 3.2 mi from [facility], call in advance for appointment at 7:00pm, must provide valid photo ID  Thursday  9:00am-12:00noon Boeing, [address] [place], [phone], 1.3 mi from [facility], can come once every 3 months, bring your photo ID and SS numbers for other residents of household   9:00am-1:00pm Bowers,  [place], [address], [phone]/Ext 1, 1.6 mi from [facility], can come once every two weeks  9:30am-12:00noon Tenneco Inc, [address] [place], [phone], 4.5 mi from [facility], can come once every 30 days, must state income [closed Thanksgiving and week of Christmas]  9:30am-5:00pm Pacific Mutual, [place], [phone], 0.9 mi from [facility], can come four times per year, bring your photo ID and SS card for other residents of household, will make appointments for those who work and need to come after 5pm  10:00am-1:00pm Blessed Table, [place], [id number],  [phone], 3.9 mi from [facility], with referral from [place], may come six times, 30 days apart, bring your photo ID and SS cards for all residents of household   [place] the [facility], [id number]  [address], [place], [phone], 7.7 mi from [facility], can  come once every thirty days with a referral from [place], Boeing, [place], etc.- each referral good for six visits, bring photo ID   10:00am-1:00pm [facility], [address], [place], [phone], 4.2 mi from [facility], can come once every 6 months, open to [facility] residents, bring photo ID and copy of a current utility bill in your name, please call first to verify that food is available   [place]: 8:00am served in [facility] by [place] 8:30am SHUTTLE provided from [facility], served  at [facility], [address] LUNCH TWO LOCATIONS [plus one additional third Sunday only] 10:30am - 12:30pm served at CMS Energy Corporation, Pacific Mutual, [place] (1.2 miles from [facility]) 12:30pm served in [place] by BJ's Team ([place] Sunday only) 1:30pm served at [facility] by [facility] one location [plus one additional third Sunday only] 5:00pm Every Sunday, served under the bridge at [place]. by [place] (.7 miles from [facility]) ([place] Sunday ONLY) 4:00pm served in the parking garage, across from Sempra Energy, corner of [place] and [place] by H. J. Heinz Works Ministries MONDAYS BREAKFAST 7:30am served in Semp[employer] Energy by the Health Net and Friends LUNCH 10:30am - 12:30pm served at [employer] Energy Corporation, Pacific Mutual, [place] (1.2 miles from [facility]) [place]: 7:00pm served in front of the courthouse at the corner of Pitney Bowes and Tech Data Corporation. by [facility] Monday Night Meal (3 blocks from [facility]) 4:30pm served at the Time Warner, [place] by Peter Kiewit Sons Not Bombs (0.6 miles from [facility]) [place] served at PepsiCo, [place] (0.3 miles  from [place]) LUNCH 10:30am - 12:30pm served at the [employer] Energy Corporation, [place] [address], (1.2 miles from [place]) [place] 6:00pm served at Dole Food, enter from Brink's Company and go to the Omnicom, (0.7 miles from [place]) [place] 7:00am - 8:00am served at [employer] Corporation, Carrier [address] (1.2 miles from [place]) [place] [plus two additional locations listed below] 10:30am - 12:30pm served at [employer] Corporation, [place] [address] (1.2 miles from [place]) (FIRST Wednesday ONLY) 11:30am served at Kelly Services, [place] (6.6 miles from [place]) ([place] Wednesday ONLY) 11:00am served at [place], Mountain Home AFB (1.3 miles from [place]) [place] 6:00pm served at Amgen Inc, [place] Wachovia Corporation. (1.3 miles from [facility]) 4:00pm - 6:00pm (hot dogs and chips) served at NCR Corporation of [facility], [place] (1.7 miles from [place]) Veteran 10:30am - 12:30pm served at [employer] Energy Corporation, Pacific Mutual, [place] [address], (1.2 miles from [place]) [place] 6:00pm served at Dole Food, enter from Brink's Company and go to the Omnicom, (0.7 miles from Sempra Energy) [place] 10:30am - 12:30pm served at CMS Energy Corporation, [place] [address] (1.2 miles from [place]) [place], [plus one additional first Friday only] 6:00pm served under the bridge at [place]. by [place]. (.7 miles from [facility]) 5:00pm - 7:00pm served at NCR Corporation of [facility], [place] (1.7 miles from [place]) (FIRST Friday ONLY) 5:45 pm - SHUTTLE provided from the [place] at 5:45pm. Served at [facility], [place] [plus one additional last Saturday only] 8:00am served at [facility] by Smithfield Foods 8:30am served at Land O'Lakes, [place] KB Home	[place]. (2.2 miles from [facility]) (LAST Saturday ONLY) 8:30am  served at Sanmina-SCI, [place] (5 miles from Sempra Energy) LUNCH 10:30am - 12:30pm served at CMS Energy Corporation, Pacific Mutual [place] Sharolyn Douglas., (1.2 miles from [facility]) [place] 6:00pm served under the bridge at [place]. by The Procter & Gamble (0.7 miles from Sempra Energy)  [place] at Russell. (.7 miles from [place]) [place] on [place] Hills Right onto Northwest Airlines 433 ft. Continue onto [address] under bridge, about 500 ft. Courthouse (3 blocks from [facility]) [place] on [place] right on [place] 1 block to Parker Hannifin (.5 miles from [place]. [place]) [place] on [place]. Temple-Inland. past WellPoint to Cablevision Systems. Enter from Brink's Company and go to the Office Depot building Amgen Inc [address] Wach[address]ia [address]. (1.3 miles from [facility]) [place] on [place] Right onto W. Sharolyn Douglas. church will be on the Left. [place] [address] (.3 miles from [facility]) Go .3 miles on W. Friendly Destination is on your right Kelly Services at American International Group (6.6 miles from [place]) [place] on [place] toward W Friendly Turn right onto [address] Friendly Continue onto Verizon. Continue onto [place] is on right [facility] Steel Holding Corporation) [place] [address]. (.6 miles from [place]) [place] on [place]. [address] Turn Left onto E. Winchester. 0.3 miles Destination is on the Left. [place] at R.R. Donnelley (5 miles from Sempra Energy) 1. Head south on [place] right onto W Friendly Turn slightly left onto Colgate-Palmolive Continue onto Colgate-Palmolive Turn right at [place] to church on right [place] Birth Sounds of [facility] [address]/[place]ug[place] (1.7 miles from Alpine) [place] on [place] 1.4 miles [place] becomes [place]. Elm [address] Continue 0.6 miles and church will be on theright. [place] at [address] (2.5 miles from Sempra Energy) [place] provided from [place] on [place]. Elm toward Lowe's Companies right onto El Paso Corporation left onto Office Depot left onto Cane Beds Baxter International (2.2 miles from [place]) [place] on [place] 1.4 miles [place] becomes [place]. Anderson Turn right onto Robinson Mill. and church will be on the Left. Potter's House/[place] ArvinMeritor Cutler [place] (1.2 miles from [facility]) 1.Turn right onto [facility] 2.Turn left onto Carlos Levering 3.Maryclare Labrador 4.Destination is on your right [place] at Home Depot (1.3 miles from [facility]) Scott AFB on [place] left onto Emerson Electric right onto S. Lowrys onto Fiserv. Turn left onto Alamo right onto Campbell Soup. If you are in need of a shelter please call [facility] ([place]) at [phone] between the hours of 9am-5pm Mon-Fri.  [facility] will contact all the local shelters to find openings.  Right now they are requiring people to quarantine at a hotel before they can go to an open bed but [facility] may be able to set that up as well.  They are not  open on weekends.  On Monday-Friday morning at 8 am until 1 pm, you can also go to the  Time Warner (see below) to seek shelter in the [facility] prior to entering a shelter. You can also call the number provided (see the above paragraph) to seek placement into the program by calling [facility].   Interactive resource center [facility]) [place], [place] [address] Phone: [phone] Fax: [phone]  For Free Breakfasts and Lunches 7 days a week you can go to: [facility] [address] [place], [place], [place] [address] Hours: Open today  8AM-5PM Phone: [phone] Breakfast: 6:30-7:30 am Lunch served: 10:40 am - 12:40pm         [place] [facility]) M-F 8am-3pm   [address], [place] [address]   [phone] Services include: laundry, barbering, support groups, case management, phone  & computer access, showers, AA/NA mtgs, mental health/substance abuse nurse, job skills class, disability information, VA assistance, spiritual classes, etc.   [facility] [place], [place] Provides breakfast each weekday morning except Wednesdays, and an evening community meal every Friday. Access to showers is available during breakfast hours and telephones for seeking work are also provided. Also offers job referral and counseling for the homeless and unemployed.  HOMELESS SHELTERS [facility]   [place], [place] [address]     [address], [place]  [phone]      [phone]  [facility] Mens [place] [address]    [address] [place] [address]     [place], [place] [address] [id number]  [facility] (women only) [address] [place], [place] [address] [place]: Rondall Allegra (overflow shelter: [facility], [place] check in at 6pm for placement in local shelter).  [place], [place], [place] [address] Phone: [place]     [place]      [facility] [phone]. [place], [place] [address]     [place], [place] [address]    [facility] Mobile Crisis Management - [phone]

## 2022-06-11 NOTE — Progress Notes (Signed)
   06/10/22 2230  Assess: MEWS Score  BP (!) 72/44  MAP (mmHg) (!) 54  Assess: MEWS Score  MEWS Temp 0  MEWS Systolic 2  MEWS Pulse 0  MEWS RR 0  MEWS LOC 0  MEWS Score 2  MEWS Score Color Yellow  Assess: if the MEWS score is Yellow or Red  Were vital signs taken at a resting state? Yes  Focused Assessment No change from prior assessment  MEWS guidelines implemented  Yes, yellow  Treat  MEWS Interventions Considered administering scheduled or prn medications/treatments as ordered  Take Vital Signs  Increase Vital Sign Frequency  Yellow: Q2hr x1, continue Q4hrs until patient remains green for 12hrs  Escalate  MEWS: Escalate Yellow: Discuss with charge nurse and consider notifying provider and/or RRT  Notify: Charge Nurse/RN  Name of Charge Nurse/RN Notified Grayce Sessions, RN  Provider Notification  Provider Name/Title J.Olena Heckle  Date Provider Notified 06/10/22  Time Provider Notified 2229  Method of Notification Page (secure chat)  Notification Reason Other (Comment) (low bp)  Provider response See new orders  Date of Provider Response 06/10/22  Time of Provider Response 2231   Patient blood pressure starting to drop. the lowest been 53/39 map 48; the highest 85/54, map 64 checked on trendelenburg position. 88/58 ( manual). Patient not experienced any dizziness, nor nausea. he had 12.5 mg coreg @05 :00 pm ish, 100mg  seroquel around 8 pm. he had done today a percut drain at bedside . on cont LR @75ml /hr  250 ml LR bolus Ordered was  received. Post bolus, BP 86/62 map 79. HR 76 2 more orders of 250 LR bolus was received. Post boluses, BP 98/64(74) HR 72 Patient tolerated well., no sob, lungs sound clear, no signs of fluid overload noted. N

## 2022-06-12 DIAGNOSIS — K810 Acute cholecystitis: Secondary | ICD-10-CM | POA: Diagnosis not present

## 2022-06-12 LAB — COMPREHENSIVE METABOLIC PANEL
ALT: 11 U/L (ref 0–44)
AST: 24 U/L (ref 15–41)
Albumin: 2.5 g/dL — ABNORMAL LOW (ref 3.5–5.0)
Alkaline Phosphatase: 81 U/L (ref 38–126)
Anion gap: 5 (ref 5–15)
BUN: 12 mg/dL (ref 8–23)
CO2: 24 mmol/L (ref 22–32)
Calcium: 8.4 mg/dL — ABNORMAL LOW (ref 8.9–10.3)
Chloride: 104 mmol/L (ref 98–111)
Creatinine, Ser: 0.8 mg/dL (ref 0.61–1.24)
GFR, Estimated: >60 mL/min (ref >60)
Glucose, Bld: 115 mg/dL — ABNORMAL HIGH (ref 70–99)
Potassium: 4.2 mmol/L (ref 3.5–5.1)
Sodium: 133 mmol/L — ABNORMAL LOW (ref 135–145)
Total Bilirubin: 1.7 mg/dL — ABNORMAL HIGH (ref 0.3–1.2)
Total Protein: 5.6 g/dL — ABNORMAL LOW (ref 6.5–8.1)

## 2022-06-12 LAB — CBC
HCT: 34.5 % — ABNORMAL LOW (ref 39.0–52.0)
Hemoglobin: 11.8 g/dL — ABNORMAL LOW (ref 13.0–17.0)
MCH: 27.8 pg (ref 26.0–34.0)
MCHC: 34.2 g/dL (ref 30.0–36.0)
MCV: 81.2 fL (ref 80.0–100.0)
Platelets: 211 10*3/uL (ref 150–400)
RBC: 4.25 MIL/uL (ref 4.22–5.81)
RDW: 14.2 % (ref 11.5–15.5)
WBC: 12.8 10*3/uL — ABNORMAL HIGH (ref 4.0–10.5)
nRBC: 0 % (ref 0.0–0.2)

## 2022-06-12 LAB — GLUCOSE, CAPILLARY
Glucose-Capillary: 96 mg/dL (ref 70–99)
Glucose-Capillary: 152 mg/dL — ABNORMAL HIGH (ref 70–99)
Glucose-Capillary: 175 mg/dL — ABNORMAL HIGH (ref 70–99)
Glucose-Capillary: 129 mg/dL — ABNORMAL HIGH (ref 70–99)

## 2022-06-12 LAB — MAGNESIUM: Magnesium: 1.8 mg/dL (ref 1.7–2.4)

## 2022-06-12 MED ORDER — CLOPIDOGREL BISULFATE 75 MG PO TABS
75.0000 mg | ORAL_TABLET | Freq: Every day | ORAL | Status: DC
  Administered 2022-06-12 – 2022-06-13 (×2): 75 mg via ORAL
  Filled 2022-06-12 (×2): qty 1

## 2022-06-12 MED ORDER — AMOXICILLIN-POT CLAVULANATE 875-125 MG PO TABS
1.0000 | ORAL_TABLET | Freq: Two times a day (BID) | ORAL | Status: DC
  Administered 2022-06-12 – 2022-06-13 (×3): 1 via ORAL
  Filled 2022-06-12 (×3): qty 1

## 2022-06-12 NOTE — Progress Notes (Signed)
   06/12/22 1214  Mobility  Activity Ambulated with assistance in hallway  Level of Assistance Contact guard assist, steadying assist  Assistive Device Cane  Distance Ambulated (ft) 60 ft  Activity Response Tolerated well  Mobility Referral Yes  $Mobility charge 1 Mobility   Mobility Specialist Progress Note  Pt was in bed and agreeable. Was very unsteady throughout ambulation. Left in bed w/ all needs met and alarm on.   Lucious Groves Mobility Specialist  Please contact via SecureChat or Rehab office at [phone]

## 2022-06-12 NOTE — Progress Notes (Signed)
Referring Physician(s): Dr. Brantley Stage  Supervising Physician: Aletta Edouard  Patient Status:  [facility] - In-pt  Chief Complaint: Acute cholecystitis  Subjective: Patient resting in bed.  Eating crackers with tolerance.  No complaints related to drain.   Allergies: Glucophage [metformin]  Medications: Prior to Admission medications   Medication Sig Start Date End Date Taking? Authorizing Provider  albuterol (VENTOLIN HFA) 108 (90 Base) MCG/ACT inhaler Inhale 2 puffs into the lungs every 6 (six) hours as needed for wheezing or shortness of breath. 08/16/20  Yes Elsie Stain, MD  budesonide-formoterol [facility]) 160-4.5 MCG/ACT inhaler Inhale 2 puffs into the lungs 2 (two) times daily. Patient taking differently: Inhale 1-2 puffs into the lungs 2 (two) times daily as needed (wheezing, shortness of breath). 08/16/20 08/16/22 Yes Elsie Stain, MD  clopidogrel (PLAVIX) 75 MG tablet Take 1 tablet (75 mg total) by mouth daily. Take with aspirin for 1 month, then take plavix alone Patient taking differently: Take 75 mg by mouth daily. 08/16/20  Yes Elsie Stain, MD  QUEtiapine (SEROQUEL) 100 MG tablet Take 1 tablet (100 mg total) by mouth at bedtime. 08/16/20  Yes Elsie Stain, MD  traZODone (DESYREL) 100 MG tablet Take 1 tablet (100 mg total) by mouth at bedtime as needed. for sleep Patient taking differently: Take 100 mg by mouth at bedtime as needed for sleep. 08/16/20  Yes Elsie Stain, MD  aspirin EC 325 MG tablet Take 1 tablet (325 mg total) by mouth daily. For one month then stop Patient not taking: Reported on 06/09/2022 08/16/20   Elsie Stain, MD  atorvastatin (LIPITOR) 80 MG tablet Take 1 tablet (80 mg total) by mouth daily at 6 PM. Patient not taking: Reported on 06/09/2022 08/16/20 09/15/20  Elsie Stain, MD  carvedilol (COREG) 12.5 MG tablet Take 1 tablet (12.5 mg total) by mouth 2 (two) times daily with a meal. Patient not taking: Reported on 06/09/2022 08/16/20  09/15/20  Elsie Stain, MD  ezetimibe (ZETIA) 10 MG tablet Take 1 tablet (10 mg total) by mouth daily. Patient not taking: Reported on 06/09/2022 08/17/20   Freada Bergeron, MD  fluticasone [facility]) 50 MCG/ACT nasal spray USE 2 SPRAYS IN [facility] NOSTRIL EVERY DAY Patient not taking: Reported on 06/09/2022 11/14/20   Elsie Stain, MD  metFORMIN (GLUCOPHAGE) 500 MG tablet Take 1 tablet (500 mg total) by mouth daily with breakfast. Patient not taking: Reported on 06/09/2022 08/16/20   Elsie Stain, MD  pantoprazole (PROTONIX) 40 MG tablet TAKE 1 TABLET BY MOUTH EVERY DAY WITH BREAKFAST Patient not taking: Reported on 06/09/2022 08/16/20   Elsie Stain, MD     Vital Signs: BP 128/73 (BP Location: Left Arm)   Pulse 63   Temp 97.6 F (36.4 C)   Resp 17   Ht 5\' 10"  (1.778 m)   Wt 143 lb (64.9 kg)   SpO2 97%   BMI 20.52 kg/m   Physical Exam NAD, alert, sitting up in bed.  Abdomen: RUQ drain in place.  Insertion site intact.  Blood-tinged output in gravity bag.  Flushes and aspirates easily.   Imaging: ECHOCARDIOGRAM COMPLETE  Result Date: 06/10/2022    ECHOCARDIOGRAM REPORT   Patient Name:   Andre Rojas Date of Exam: 06/10/2022 Medical Rec #:  [id number]      Height:       70.0 in Accession #:    [id number]     Weight:  143.0 lb Date of Birth:  09-Jul-1950       BSA:          1.810 m Patient Age:    72 years       BP:           142/86 mmHg Patient Gender: M              HR:           91 bpm. Exam Location:  Inpatient Procedure: 2D Echo, Color Doppler, Cardiac Doppler and Strain Analysis Indications:    Preop eval, Anasarca  History:        Patient has prior history of Echocardiogram examinations, most                 recent 06/29/2015. CAD and Previous Myocardial Infarction, COPD;                 Risk Factors:Dyslipidemia, Hypertension, Current Smoker and                 Diabetes. Cocaine Abuse, CABG x3,.  Sonographer:    Rolla Etienne Referring Phys: [id number] [place]   1. Severe biventricular hypertrophy with septal thickness 23 mm at greatest and decreased global longitudinal strain spairing the true apex. Left ventricular ejection fraction, by estimation, is 30 to 35%. The left ventricle has moderately decreased function. The left ventricle demonstrates global hypokinesis. There is severe concentric left ventricular hypertrophy. Left ventricular diastolic parameters are indeterminate. The average left ventricular global longitudinal strain is -4.6 %. The global longitudinal strain is abnormal.  2. Right ventricular systolic function is moderately reduced. The right ventricular size is normal. Moderately increased right ventricular wall thickness. Tricuspid regurgitation signal is inadequate for assessing PA pressure.  3. The mitral valve is abnormal. No evidence of mitral valve regurgitation. No evidence of mitral stenosis.  4. The aortic valve is calcified. There is mild calcification of the aortic valve. Aortic valve regurgitation is not visualized. No aortic stenosis is present. Comparison(s): No prior Echocardiogram. Conclusion(s)/Recommendation(s): Severe biventricular hypertrophy with cardiomyopathy. Consider outpatient evaluation for infiltrative disease. FINDINGS  Left Ventricle: Severe biventricular hypertrophy with septal thickness 23 mm at greatest and decreased global longitudinal strain spairing the true apex. Left ventricular ejection fraction, by estimation, is 30 to 35%. The left ventricle has moderately decreased function. The left ventricle demonstrates global hypokinesis. The average left ventricular global longitudinal strain is -4.6 %. The global longitudinal strain is abnormal. The left ventricular internal cavity size was small. There is severe concentric left ventricular hypertrophy. Left ventricular diastolic parameters are indeterminate. Right Ventricle: The right ventricular size is normal. Moderately increased right ventricular wall thickness. Right  ventricular systolic function is moderately reduced. Tricuspid regurgitation signal is inadequate for assessing PA pressure. Left Atrium: Left atrial size was normal in size. Right Atrium: Right atrial size was normal in size. Pericardium: There is no evidence of pericardial effusion. Mitral Valve: The mitral valve is abnormal. No evidence of mitral valve regurgitation. No evidence of mitral valve stenosis. Tricuspid Valve: The tricuspid valve is normal in structure. Tricuspid valve regurgitation is not demonstrated. No evidence of tricuspid stenosis. Aortic Valve: The aortic valve is calcified. There is mild calcification of the aortic valve. Aortic valve regurgitation is not visualized. No aortic stenosis is present. Aortic valve mean gradient measures 3.0 mmHg. Aortic valve peak gradient measures 5.6 mmHg. Aortic valve area, by VTI measures 2.55 cm. Pulmonic Valve: The pulmonic valve was normal in structure. Pulmonic  valve regurgitation is not visualized. No evidence of pulmonic stenosis. Aorta: The aortic root is normal in size and structure. IAS/Shunts: No atrial level shunt detected by color flow Doppler.  LEFT VENTRICLE PLAX 2D LVIDd:         3.20 cm     Diastology LVIDs:         2.30 cm     LV e' medial:    4.68 cm/s LV PW:         2.30 cm     LV E/e' medial:  10.3 LV IVS:        2.30 cm     LV e' lateral:   7.72 cm/s LVOT diam:     2.10 cm     LV E/e' lateral: 6.3 LV SV:         42 LV SV Index:   23          2D Longitudinal Strain LVOT Area:     3.46 cm    2D Strain GLS Avg:     -4.6 %  LV Volumes (MOD) LV vol d, MOD A2C: 41.2 ml LV vol d, MOD A4C: 46.6 ml LV vol s, MOD A2C: 17.2 ml LV vol s, MOD A4C: 27.1 ml LV SV MOD A2C:     24.0 ml LV SV MOD A4C:     46.6 ml LV SV MOD BP:      21.8 ml RIGHT VENTRICLE RV Basal diam:  2.50 cm RV Mid diam:    1.90 cm RV S prime:     8.92 cm/s TAPSE (M-mode): 0.8 cm LEFT ATRIUM             Index        RIGHT ATRIUM           Index LA diam:        4.30 cm 2.38 cm/m   RA  Area:     13.70 cm LA Vol (A2C):   53.8 ml 29.72 ml/m  RA Volume:   32.10 ml  17.74 ml/m LA Vol (A4C):   36.8 ml 20.33 ml/m LA Biplane Vol: 47.2 ml 26.08 ml/m  AORTIC VALVE AV Area (Vmax):    2.45 cm AV Area (Vmean):   2.46 cm AV Area (VTI):     2.55 cm AV Vmax:           118.00 cm/s AV Vmean:          83.300 cm/s AV VTI:            0.163 m AV Peak Grad:      5.6 mmHg AV Mean Grad:      3.0 mmHg LVOT Vmax:         83.60 cm/s LVOT Vmean:        59.200 cm/s LVOT VTI:          0.120 m LVOT/AV VTI ratio: 0.74  AORTA Ao Root diam: 3.40 cm MITRAL VALVE MV Area (PHT): 2.93 cm    SHUNTS MV Decel Time: 259 msec    Systemic VTI:  0.12 m MV E velocity: 48.40 cm/s  Systemic Diam: 2.10 cm MV A velocity: 48.40 cm/s MV E/A ratio:  1.00 Rudean Haskell MD Electronically signed by Rudean Haskell MD Signature Date/Time: 06/10/2022/12:28:03 PM    Final    IR Perc Cholecystostomy  Result Date: 06/10/2022 INDICATION: ACUTE CALCULUS CHOLECYSTITIS EXAM: Ultrasound and fluoroscopic percutaneous cholecystostomy MEDICATIONS: Patient is already receiving IV Zosyn as an inpatient; The antibiotic was administered  within an appropriate time frame prior to the initiation of the procedure. ANESTHESIA/SEDATION: Moderate (conscious) sedation was employed during this procedure. A total of Versed 1.0 mg and Fentanyl 50 mcg was administered intravenously by the radiology nurse. Total intra-service moderate Sedation Time: 12 minutes. The patient's level of consciousness and vital signs were monitored continuously by radiology nursing throughout the procedure under my direct supervision. FLUOROSCOPY: Radiation Exposure Index (as provided by the fluoroscopic device): 1.0 mGy Kerma COMPLICATIONS: None immediate. PROCEDURE: Informed written consent was obtained from the patient after a thorough discussion of the procedural risks, benefits and alternatives. All questions were addressed. Maximal Sterile Barrier Technique was utilized  including caps, mask, sterile gowns, sterile gloves, sterile drape, hand hygiene and skin antiseptic. A timeout was performed prior to the initiation of the procedure. Previous imaging reviewed. Preliminary ultrasound performed. The gallbladder was localized in the mid axillary line through a lower intercostal space. Overlying skin marked. Under sterile conditions and local anesthesia, ultrasound percutaneous needle access performed of the gallbladder. Needle position confirmed with ultrasound. Images obtained for documentation. There was return of bile. Sample sent for culture. Guidewire inserted followed by the transitional dilator set. Amplatz guidewire advanced. Tract dilatation performed to insert a 10 [place] drain. Retention loop formed in the gallbladder proximally. Position confirmed with ultrasound and fluoroscopy. 30 cc exudative bile aspirated. Catheter secured with a Prolene suture. Sterile dressing applied. Gravity drainage bag connected. Patient tolerated the procedure well. IMPRESSION: Successful ultrasound and fluoroscopic cholecystostomy placement. Electronically Signed   By: Jerilynn Mages.  Shick M.D.   On: 06/10/2022 10:42   US Abdomen Limited RUQ (LIVER/GB)  Result Date: 06/09/2022 CLINICAL DATA:  Right upper quadrant pain EXAM: ULTRASOUND ABDOMEN LIMITED RIGHT UPPER QUADRANT COMPARISON:  CT 06/09/2022 FINDINGS: Gallbladder: Multiple echogenic, shadowing stones within the gallbladder measuring up to 1.9 cm in diameter. Diffuse gallbladder wall thickening of 0.6 cm. A positive sonographic Murphy sign noted by sonographer. Common bile duct: Diameter: 3 mm. Liver: No focal lesion or intraparenchymal fluid collection identified with attention to the gallbladder fossa. Within normal limits in parenchymal echogenicity. Portal vein is patent on color Doppler imaging with normal direction of blood flow towards the liver. Other: None. IMPRESSION: 1. Sonographic findings compatible with acute cholecystitis. 2. No  focal lesion or intraparenchymal fluid collection identified with attention to the gallbladder fossa to correspond to abnormality seen on the prior CT. Electronically Signed   By: Davina Poke D.O.   On: 06/09/2022 13:48   CT ABDOMEN PELVIS W CONTRAST  Result Date: 06/09/2022 CLINICAL DATA:  Right upper quadrant pain. EXAM: CT ABDOMEN AND PELVIS WITH CONTRAST TECHNIQUE: Multidetector CT imaging of the abdomen and pelvis was performed using the standard protocol following bolus administration of intravenous contrast. RADIATION DOSE REDUCTION: This exam was performed according to the departmental dose-optimization program which includes automated exposure control, adjustment of the mA and/or kV according to patient size and/or use of iterative reconstruction technique. CONTRAST:  34mL OMNIPAQUE IOHEXOL 350 MG/ML SOLN COMPARISON:  08/07/2012 FINDINGS: Lower chest: Cardiomegaly.  No pericardial or pleural effusion. Hepatobiliary: Grossly abnormal appearance of the gallbladder with pericholecystic fluid and areas of hypoattenuation surrounded by enhancement in the adjacent hepatic parenchyma that could represent abscesses. There are few peripherally enhancing lesions in the liver that could be abscesses in the setting of cholecystitis. A neoplastic etiology is not excluded. Pancreas: Unremarkable. No pancreatic ductal dilatation or surrounding inflammatory changes. Spleen: Normal in size without focal abnormality. Adrenals/Urinary Tract: No adrenal lesions are identified. No hydronephrosis  or stones identified. Urinary bladder wall thickening is nonspecific can be seen with inflammation or underdistention. Stomach/Bowel: Evaluation for bowel pathology was limited by lack of GI contrast. No bowel dilatation to suggest obstruction. Unremarkable appendix. Vascular/Lymphatic: Aortic atherosclerosis. No enlarged abdominal or pelvic lymph nodes. Reproductive: Prostate is unremarkable. Other: Diffuse subcutaneous and  mesenteric edema consistent with anasarca. Musculoskeletal: Thoracolumbar degenerative changes. There is anterior wedge compression deformity of L1 which is new since 2014 and age-indeterminate. IMPRESSION: 1. Abnormal gallbladder with adjacent fluid and inflammatory changes. 2. Hepatic hypodensities with peripheral enhancement that could represent abscesses in the setting of infection although a neoplastic process cannot be excluded. 3. Nonspecific urinary bladder wall thickening. 4. Anasarca. 5. Age-indeterminate L1 compression deformity and thoracolumbar degenerative changes. Electronically Signed   By: Sammie Bench M.D.   On: 06/09/2022 11:41    Labs:  CBC: Recent Labs    06/09/22 0851 06/10/22 0530 06/11/22 0314 06/12/22 0410  WBC 11.3* 17.7* 12.7* 12.8*  HGB 13.4 15.3 12.7* 11.8*  HCT 41.0 44.5 36.9* 34.5*  PLT 233 283 216 211    COAGS: Recent Labs    06/09/22 1616 06/10/22 0530  INR 1.1 1.2    BMP: Recent Labs    06/09/22 0851 06/10/22 0530 06/11/22 0314 06/12/22 0410  NA 132* 130* 132* 133*  K 3.3* 3.6 3.3* 4.2  CL 98 94* 98 104  CO2 23 25 25 24   GLUCOSE 142* 112* 102* 115*  BUN 6* <5* 9 12  CALCIUM 8.7* 8.9 8.4* 8.4*  CREATININE 0.67 0.87 0.83 0.80  GFRNONAA >60 >60 >60 >60    LIVER FUNCTION TESTS: Recent Labs    06/09/22 0851 06/10/22 0530 06/11/22 0314 06/12/22 0410  BILITOT 1.5* 2.5* 2.6* 1.7*  AST 27 27 21 24   ALT 13 13 9 11   ALKPHOS 112 122 84 81  PROT 7.0 7.0 5.6* 5.6*  ALBUMIN 3.6 3.3* 2.6* 2.5*    Assessment and Plan: Acute cholecystitis Patient assessed at bedside.  Reviewed drain care and would also benefit from reinforcement from nursing prior to discharge.  Patient lives in [place].  No personal cell phone at this time.  Clinic visit arranged prior to discharge and placed on AVS along with contact information for patient once he has access.  He has been scheduled for chole drain injection 4/24 at 12:40 in Interventional  Radiology drain clinic.  He voices understanding.    Electronically Signed: Docia Barrier, PA 06/12/2022, 2:07 PM   I spent a total of 15 Minutes at the the patient's bedside AND on the patient's hospital floor or unit, greater than 50% of which was counseling/coordinating care for acute cholecystitis.

## 2022-06-12 NOTE — Progress Notes (Addendum)
[facility] Progress Note     Subjective: CC-  Sleepy this morning but no complaints. Tolerating diet without n/v. Denies abdominal pain.  Objective: Vital signs in last 24 hours: Temp:  [97.4 F (36.3 C)-98 F (36.7 C)] 97.6 F (36.4 C) (04/04 0457) Pulse Rate:  [63-71] 63 (04/04 0724) Resp:  [16-17] 17 (04/04 0757) BP: (104-128)/(64-73) 128/73 (04/04 0724) SpO2:  [97 %-99 %] 97 % (04/04 0757) Last BM Date : 06/09/22  Intake/Output from previous day: 04/03 0701 - 04/04 0700 In: 250 [P.O.:240] Out: 2110 [Urine:2050; Drains:60] Intake/Output this shift: No intake/output data recorded.  PE: Gen:  Alert, NAD Abd: soft, ND, nontender, perc chole bag recently emptied with scant serosanguinous appearing fluid  Lab Results:  Recent Labs    06/11/22 0314 06/12/22 0410  WBC 12.7* 12.8*  HGB 12.7* 11.8*  HCT 36.9* 34.5*  PLT 216 211   BMET Recent Labs    06/11/22 0314 06/12/22 0410  NA 132* 133*  K 3.3* 4.2  CL 98 104  CO2 25 24  GLUCOSE 102* 115*  BUN 9 12  CREATININE 0.83 0.80  CALCIUM 8.4* 8.4*   PT/INR Recent Labs    06/09/22 1616 06/10/22 0530  LABPROT 13.7 14.8  INR 1.1 1.2   CMP     Component Value Date/Time   NA 133 (L) 06/12/2022 0410   NA 140 08/16/2020 1218   K 4.2 06/12/2022 0410   CL 104 06/12/2022 0410   CO2 24 06/12/2022 0410   GLUCOSE 115 (H) 06/12/2022 0410   BUN 12 06/12/2022 0410   BUN 6 (L) 08/16/2020 1218   CREATININE 0.80 06/12/2022 0410   CALCIUM 8.4 (L) 06/12/2022 0410   PROT 5.6 (L) 06/12/2022 0410   PROT 6.4 08/16/2020 1218   ALBUMIN 2.5 (L) 06/12/2022 0410   ALBUMIN 4.1 08/16/2020 1218   AST 24 06/12/2022 0410   ALT 11 06/12/2022 0410   ALKPHOS 81 06/12/2022 0410   BILITOT 1.7 (H) 06/12/2022 0410   BILITOT 0.8 08/16/2020 1218   GFRNONAA >60 06/12/2022 0410   GFRAA 100 03/20/2020 1631   Lipase     Component Value Date/Time   LIPASE 21 06/09/2022 0851       Studies/Results: IR Perc  Cholecystostomy  Result Date: 06/10/2022 INDICATION: ACUTE CALCULUS CHOLECYSTITIS EXAM: Ultrasound and fluoroscopic percutaneous cholecystostomy MEDICATIONS: Patient is already receiving IV Zosyn as an inpatient; The antibiotic was administered within an appropriate time frame prior to the initiation of the procedure. ANESTHESIA/SEDATION: Moderate (conscious) sedation was employed during this procedure. A total of Versed 1.0 mg and Fentanyl 50 mcg was administered intravenously by the radiology nurse. Total intra-service moderate Sedation Time: 12 minutes. The patient's level of consciousness and vital signs were monitored continuously by radiology nursing throughout the procedure under my direct supervision. FLUOROSCOPY: Radiation Exposure Index (as provided by the fluoroscopic device): 1.0 mGy Kerma COMPLICATIONS: None immediate. PROCEDURE: Informed written consent was obtained from the patient after a thorough discussion of the procedural risks, benefits and alternatives. All questions were addressed. Maximal Sterile Barrier Technique was utilized including caps, mask, sterile gowns, sterile gloves, sterile drape, hand hygiene and skin antiseptic. A timeout was performed prior to the initiation of the procedure. Previous imaging reviewed. Preliminary ultrasound performed. The gallbladder was localized in the mid axillary line through a lower intercostal space. Overlying skin marked. Under sterile conditions and local anesthesia, ultrasound percutaneous needle access performed of the gallbladder. Needle position confirmed with ultrasound. Images obtained for documentation. There was  return of bile. Sample sent for culture. Guidewire inserted followed by the transitional dilator set. Amplatz guidewire advanced. Tract dilatation performed to insert a 10 [place] drain. Retention loop formed in the gallbladder proximally. Position confirmed with ultrasound and fluoroscopy. 30 cc exudative bile aspirated. Catheter  secured with a Prolene suture. Sterile dressing applied. Gravity drainage bag connected. Patient tolerated the procedure well. IMPRESSION: Successful ultrasound and fluoroscopic cholecystostomy placement. Electronically Signed   By: Jerilynn Mages.  Shick M.D.   On: 06/10/2022 10:42    Anti-infectives: Anti-infectives (From admission, onward)    Start     Dose/Rate Route Frequency Ordered Stop   06/11/22 1000  doxycycline (VIBRA-TABS) tablet 100 mg        100 mg Oral Every 12 hours 06/11/22 0908 06/14/22 2159   06/10/22 1021  ceFAZolin (ANCEF) IVPB 2g/100 mL premix        over 30 Minutes Intravenous Continuous PRN 06/10/22 1024 06/10/22 1021   06/10/22 1015  ceFAZolin (ANCEF) IVPB 2g/100 mL premix  Status:  Discontinued        2 g 200 mL/hr over 30 Minutes Intravenous To Radiology 06/10/22 0926 06/10/22 1018   06/10/22 1000  doxycycline (VIBRAMYCIN) 100 mg in sodium chloride 0.9 % 250 mL IVPB  Status:  Discontinued        100 mg 125 mL/hr over 120 Minutes Intravenous Every 12 hours 06/10/22 0904 06/11/22 0908   06/09/22 2200  piperacillin-tazobactam (ZOSYN) IVPB 3.375 g        3.375 g 12.5 mL/hr over 240 Minutes Intravenous Every 8 hours 06/09/22 1234     06/09/22 1530  doxycycline (VIBRA-TABS) tablet 100 mg  Status:  Discontinued        100 mg Oral Every 12 hours 06/09/22 1527 06/10/22 0904   06/09/22 1245  piperacillin-tazobactam (ZOSYN) IVPB 3.375 g        3.375 g 100 mL/hr over 30 Minutes Intravenous  Once 06/09/22 1233 06/09/22 1315        Assessment/Plan Acute cholecystitis - s/p perc chole by IR 4/2  - Culture NGTD. Improving on zosyn. Continue zosyn for now, likely can transition to augmentin at discharge pending culture data says otherwise. Recommend 10 total days of abx - asked nurse to teach patient how to manage drain - Follow up with Dr. Brantley Stage in about 1 month to discuss cholecystectomy. Appointment on AVS. We will sign off, please call with questions or concerns.   FEN: HH/CM  diet ID: [id number], doxycycline (COPD exacerbation) VTE: SCDs, okay for chemical ppx from surgical perspective, resume plavix per IR reccs   Per primary Age indeterminite L1 compression fx CVA on Plavix  Hx HTN Hx HLD PVD Hx CAD s/p CABG x 3 COPD w/ tobacco use Bipolar disorder Schizophrenia Cocaine use (last use 3d ago)  I reviewed last 24 h vitals and pain scores, last 48 h intake and output, and last 24 h labs and trends.    LOS: 3 days    [facility] 06/12/2022, 8:42 AM Please see Amion for pager number during day hours 7:00am-4:30pm

## 2022-06-12 NOTE — Progress Notes (Signed)
PROGRESS NOTE    Andre Rojas  [id number] DOB: [birth year] DOA: 06/09/2022 PCP: Elsie Stain, MD   Brief Narrative:  72 y.o. male with medical history significant of bipolar d/o/schizophrenia, chronic back pain, cocaine dependence, CAD s/p CABG, COPD, DM, HTN, HLD, and homelessness presenting with abdominal pain. His workup is concerning for acute cholecystitis.  CT A/P showed abnormal gallbladder with adjacent fluid and inflammatory changes with hepatic hypodensities with peripheral enhancement c/w abscesses (although a neoplastic process cannot be excluded). RUQ US showed multiple gallstones measuring up to 1.9 cm with diffuse gallbladder wall thickening of 0.6 cm.  Recommend IR percutaneous Cholecystostomy Tube given the degree of inflammation on imaging.  Cardiology was requested for medical clearance.  Patient tolerated the procedure well without any issues.  Overall he is doing better   Assessment & Plan:  Principal Problem:   Acute cholecystitis Active Problems:   Cocaine abuse (HCC)   Hyperlipidemia   S/P CABG x 3   Bipolar 1 disorder   Essential hypertension   Tobacco user   Type 2 diabetes mellitus without complications   COPD with chronic bronchitis   Chronic back pain     Acute cholecystitis status post cholecystostomy 4/1 -Seen by general surgery.  Given degree of inflammation, drain placement by IR Will transition empiric Zosyn to oral Augmentin.  Complete total 10-day course.  Will need to follow-up outpatient general surgery in about 1 month.   COPD with acute exacerbation In mild exacerbation currently on bronchodilators, steroids and nebs Complete 5 days of total oral doxycycline   CAD s/p CABG -Currently chest pain-free.  Continue home medications.  Resume Plavix   Bipolar (? Schizophrenia) Will continue meds upon discharge   Chronic back pain Pain control as needed   Cocaine abuse Last cocaine use about 2 days ago.  Counseled to quit using  this   DM A1c 6.1. Sliding scle and accuchecks.    HTN On Coreg.    HLD Statin   Tobacco dependence Counseled to quit using it.    DVT prophylaxis: SCDs Start: 06/09/22 1354 Code Status: Full  Family Communication:   Status is: Inpatient Patient working with IR to help manage his right upper quadrant drain.  If patient is unable to show that he is able to manage it he should be able to go later today or tomorrow.  Diet Orders (From admission, onward)     Start     Ordered   06/11/22 0742  Diet heart healthy/carb modified Room service appropriate? Yes; Fluid consistency: Thin  Diet effective now       Question Answer Comment  Diet-HS Snack? Nothing   Room service appropriate? Yes   Fluid consistency: Thin      06/11/22 0741            Subjective: No complaints to better Examination: Constitutional: Not in acute distress Respiratory: Clear to auscultation bilaterally Cardiovascular: Normal sinus rhythm, no rubs Abdomen: Nontender nondistended good bowel sounds Musculoskeletal: No edema noted Skin: No rashes seen Neurologic: CN 2-12 grossly intact.  And nonfocal Psychiatric: Normal judgment and insight. Alert and oriented x 3. Normal mood.  Right upper quadrant drain in place  Objective: Vitals:   06/11/22 1953 06/12/22 0457 06/12/22 0724 06/12/22 0757  BP: 111/65 104/64 128/73   Pulse: 68 64 63   Resp: 16 16 17 17   Temp: 98 F (36.7 C) 97.6 F (36.4 C)    TempSrc:      SpO2: 97% 99% 99%  97%  Weight:      Height:        Intake/Output Summary (Last 24 hours) at 06/12/2022 1120 Last data filed at 06/12/2022 0730 Gross per 24 hour  Intake 250 ml  Output 1835 ml  Net -1585 ml   Filed Weights   06/09/22 0821  Weight: 64.9 kg    Scheduled Meds:  atorvastatin  80 mg Oral q1800   clopidogrel  75 mg Oral Daily   doxycycline  100 mg Oral Q12H   insulin aspart  0-15 Units Subcutaneous TID WC   mometasone-formoterol  2 puff Inhalation BID   nicotine   14 mg Transdermal Daily   predniSONE  20 mg Oral Q breakfast   QUEtiapine  100 mg Oral QHS   Continuous Infusions:  lactated ringers 75 mL/hr at 06/12/22 0620   piperacillin-tazobactam (ZOSYN)  IV 3.375 g (06/12/22 0617)    Nutritional status     Body mass index is 20.52 kg/m.  Data Reviewed:   CBC: Recent Labs  Lab 06/09/22 0851 06/10/22 0530 06/11/22 0314 06/12/22 0410  WBC 11.3* 17.7* 12.7* 12.8*  NEUTROABS 9.1*  --   --   --   HGB 13.4 15.3 12.7* 11.8*  HCT 41.0 44.5 36.9* 34.5*  MCV 83.7 80.5 80.4 81.2  PLT 233 283 216 [id number]   Basic Metabolic Panel: Recent Labs  Lab 06/09/22 0851 06/10/22 0530 06/11/22 0314 06/12/22 0410  NA 132* 130* 132* 133*  K 3.3* 3.6 3.3* 4.2  CL 98 94* 98 104  CO2 23 25 25 24   GLUCOSE 142* 112* 102* 115*  BUN 6* <5* 9 12  CREATININE 0.67 0.87 0.83 0.80  CALCIUM 8.7* 8.9 8.4* 8.4*  MG  --   --  1.7 1.8   GFR: Estimated Creatinine Clearance: 80 mL/min (by C-G formula based on SCr of 0.8 mg/dL). Liver Function Tests: Recent Labs  Lab 06/09/22 0851 06/10/22 0530 06/11/22 0314 06/12/22 0410  AST 27 27 21 24   ALT 13 13 9 11   ALKPHOS 112 122 84 81  BILITOT 1.5* 2.5* 2.6* 1.7*  PROT 7.0 7.0 5.6* 5.6*  ALBUMIN 3.6 3.3* 2.6* 2.5*   Recent Labs  Lab 06/09/22 0851  LIPASE 21   No results for input(s): "AMMONIA" in the last 168 hours. Coagulation Profile: Recent Labs  Lab 06/09/22 1616 06/10/22 0530  INR 1.1 1.2   Cardiac Enzymes: No results for input(s): "CKTOTAL", "CKMB", "CKMBINDEX", "TROPONINI" in the last 168 hours. BNP (last 3 results) No results for input(s): "PROBNP" in the last 8760 hours. HbA1C: Recent Labs    06/09/22 1616  HGBA1C 6.1*   CBG: Recent Labs  Lab 06/11/22 0805 06/11/22 1140 06/11/22 1729 06/11/22 1953 06/12/22 0725  GLUCAP 94 106* 196* 154* 96   Lipid Profile: No results for input(s): "CHOL", "HDL", "LDLCALC", "TRIG", "CHOLHDL", "LDLDIRECT" in the last 72 hours. Thyroid Function  Tests: No results for input(s): "TSH", "T4TOTAL", "FREET4", "T3FREE", "THYROIDAB" in the last 72 hours. Anemia Panel: No results for input(s): "VITAMINB12", "FOLATE", "FERRITIN", "TIBC", "IRON", "RETICCTPCT" in the last 72 hours. Sepsis Labs: No results for input(s): "PROCALCITON", "LATICACIDVEN" in the last 168 hours.  Recent Results (from the past 240 hour(s))  Aerobic/Anaerobic Culture w Gram Stain (surgical/deep wound)     Status: None (Preliminary result)   Collection Time: 06/10/22 10:37 AM   Specimen: Gallbladder; Abscess  Result Value Ref Range Status   Specimen Description GALL BLADDER  Final   Special Requests NONE  Final   Gram  Stain   Final    ABUNDANT WBC PRESENT, PREDOMINANTLY PMN NO ORGANISMS SEEN    Culture   Final    NO GROWTH 2 DAYS Performed at [facility] [address], [place], [place] [address]    Report Status PENDING  Incomplete         Radiology Studies: No results found.         LOS: 3 days   Time spent= 35 mins    Jaymison Luber Arsenio Loader, MD [facility]  If 7PM-7AM, please contact night-coverage  06/12/2022, 11:20 AM

## 2022-06-12 NOTE — Care Management Important Message (Signed)
Important Message  Patient Details  Name: Andre Rojas MRN: [id number] Date of Birth: 1950-07-31   Medicare Important Message Given:  Yes     Hannah Beat 06/12/2022, 12:12 PM

## 2022-06-12 NOTE — TOC Progression Note (Signed)
Transition of Care [facility]) - Progression Note    Patient Details  Name: Andre Rojas MRN: [id number] Date of Birth: Aug 05, 1950  Transition of Care [facility]) CM/SW [place], [place] Phone Number: 06/12/2022, 12:10 PM  Clinical Narrative:     CSW followed up with pt regarding disposition when he leaves the hospital. MD advised that pt is coming up on being medically ready to DC. CSW asked pt if he had reviewed the shelter options, he said no. CSW asked if he needed help, and he stated yes.  Pt states he does not want to go to [facility]. Pt states he does not want to go to [facility] as his family is there and he does not want to see them. Pt states he "used up his days" at the other shelter in [place]. Pt finally agreeable to contacting [facility], CSW assisted in calling them, but pt hung up when the voicemail came on. CSW called back and left a VM requesting admission information.  Pt states he can't discharge, because he has gallstones. CSW advised to discuss this with MD. TOC will continue to assist as able.   Expected Discharge Plan: Homeless Shelter Barriers to Discharge: Continued Medical Work up  Expected Discharge Plan and Services     Post Acute Care Choice:  (Shelter) Living arrangements for the past 2 months: Homeless Shelter                                       Social Determinants of Health (SDOH) Interventions SDOH Screenings   Alcohol Screen: Low Risk  (08/15/2020)  Depression (PHQ2-9): Low Risk  (08/15/2020)  Tobacco Use: High Risk (06/10/2022)    Readmission Risk Interventions     No data to display

## 2022-06-13 ENCOUNTER — Other Ambulatory Visit (HOSPITAL_COMMUNITY): Payer: Self-pay

## 2022-06-13 ENCOUNTER — Other Ambulatory Visit (HOSPITAL_COMMUNITY): Payer: Self-pay | Admitting: Surgery

## 2022-06-13 DIAGNOSIS — K810 Acute cholecystitis: Secondary | ICD-10-CM

## 2022-06-13 LAB — COMPREHENSIVE METABOLIC PANEL
ALT: 15 U/L (ref 0–44)
AST: 28 U/L (ref 15–41)
Albumin: 2.7 g/dL — ABNORMAL LOW (ref 3.5–5.0)
Alkaline Phosphatase: 90 U/L (ref 38–126)
Anion gap: 8 (ref 5–15)
BUN: 11 mg/dL (ref 8–23)
CO2: 26 mmol/L (ref 22–32)
Calcium: 8.7 mg/dL — ABNORMAL LOW (ref 8.9–10.3)
Chloride: 99 mmol/L (ref 98–111)
Creatinine, Ser: 0.81 mg/dL (ref 0.61–1.24)
GFR, Estimated: >60 mL/min (ref >60)
Glucose, Bld: 131 mg/dL — ABNORMAL HIGH (ref 70–99)
Potassium: 3.8 mmol/L (ref 3.5–5.1)
Sodium: 133 mmol/L — ABNORMAL LOW (ref 135–145)
Total Bilirubin: 1 mg/dL (ref 0.3–1.2)
Total Protein: 5.9 g/dL — ABNORMAL LOW (ref 6.5–8.1)

## 2022-06-13 LAB — CBC
HCT: 38.6 % — ABNORMAL LOW (ref 39.0–52.0)
Hemoglobin: 12.6 g/dL — ABNORMAL LOW (ref 13.0–17.0)
MCH: 27.2 pg (ref 26.0–34.0)
MCHC: 32.6 g/dL (ref 30.0–36.0)
MCV: 83.2 fL (ref 80.0–100.0)
Platelets: 253 10*3/uL (ref 150–400)
RBC: 4.64 MIL/uL (ref 4.22–5.81)
RDW: 14.4 % (ref 11.5–15.5)
WBC: 15.3 10*3/uL — ABNORMAL HIGH (ref 4.0–10.5)
nRBC: 0 % (ref 0.0–0.2)

## 2022-06-13 LAB — GLUCOSE, CAPILLARY
Glucose-Capillary: 102 mg/dL — ABNORMAL HIGH (ref 70–99)
Glucose-Capillary: 166 mg/dL — ABNORMAL HIGH (ref 70–99)

## 2022-06-13 LAB — MAGNESIUM: Magnesium: 1.6 mg/dL — ABNORMAL LOW (ref 1.7–2.4)

## 2022-06-13 MED ORDER — PANTOPRAZOLE SODIUM 40 MG PO TBEC
40.0000 mg | DELAYED_RELEASE_TABLET | Freq: Every day | ORAL | 0 refills | Status: DC
Start: 2022-06-13 — End: 2022-07-01
  Filled 2022-06-13: qty 30, 30d supply, fill #0

## 2022-06-13 MED ORDER — MAGNESIUM SULFATE 2 GM/50ML IV SOLN
2.0000 g | Freq: Once | INTRAVENOUS | Status: AC
  Administered 2022-06-13: 2 g via INTRAVENOUS
  Filled 2022-06-13: qty 50

## 2022-06-13 MED ORDER — CLOPIDOGREL BISULFATE 75 MG PO TABS
75.0000 mg | ORAL_TABLET | Freq: Every day | ORAL | 0 refills | Status: DC
  Filled 2022-06-13: qty 30, 30d supply, fill #0

## 2022-06-13 MED ORDER — ATORVASTATIN CALCIUM 80 MG PO TABS
80.0000 mg | ORAL_TABLET | Freq: Every day | ORAL | 0 refills | Status: DC
  Filled 2022-06-13: qty 30, 30d supply, fill #0

## 2022-06-13 MED ORDER — QUETIAPINE FUMARATE 100 MG PO TABS
100.0000 mg | ORAL_TABLET | Freq: Every day | ORAL | 0 refills | Status: DC
  Filled 2022-06-13: qty 30, 30d supply, fill #0

## 2022-06-13 MED ORDER — AMOXICILLIN-POT CLAVULANATE 875-125 MG PO TABS
1.0000 | ORAL_TABLET | Freq: Two times a day (BID) | ORAL | 0 refills | Status: AC
  Filled 2022-06-13: qty 12, 6d supply, fill #0

## 2022-06-13 NOTE — Progress Notes (Signed)
RN gave patient DC instructions and the patient stated understanding. IV has been removed and the patient is waiting for TOC to deliver his medications and RN will call the Taxi.

## 2022-06-13 NOTE — Progress Notes (Signed)
   06/13/22 1153  Mobility  Activity Ambulated with assistance in hallway  Level of Assistance Contact guard assist, steadying assist  Assistive Device Cane  Distance Ambulated (ft) 60 ft  Activity Response Tolerated well  Mobility Referral Yes  $Mobility charge 1 Mobility   Mobility Specialist Progress Note  Pt was in bed and agreeable. Had no c/o pain. Returned to bed w/ all needs met and call bell in reach.   Anastasia Pall Mobility Specialist  Please contact via SecureChat or Rehab office at [phone]

## 2022-06-13 NOTE — Discharge Summary (Signed)
Physician Discharge Summary  OSHER OETTINGER ZOX:[id number] DOB: 21-Mar-1950 DOA: 06/09/2022  PCP: Storm Frisk, MD  Admit date: 06/09/2022 Discharge date: 06/13/2022  Admitted From: Home Disposition:  Home  Recommendations for Outpatient Follow-up:  Follow up with PCP in 1-2 weeks Please obtain BMP/CBC in one week your next doctors visit.  Oral Augmentin for 6 more days to complete total 10-day course Advised to resume his home Plavix, statin.  Patient was never on full dose aspirin as this was discontinued over a year ago after his CVA. Patient also reports he does not take metformin, Coreg at home. Follow-up outpatient general surgery   Discharge Condition: Stable CODE STATUS: Full code Diet recommendation: Low fat  Brief/Interim Summary: 72 y.o. male with medical history significant of bipolar d/o/schizophrenia, chronic back pain, cocaine dependence, CAD s/p CABG, COPD, DM, HTN, HLD, and homelessness presenting with abdominal pain. His workup is concerning for acute cholecystitis.  CT A/P showed abnormal gallbladder with adjacent fluid and inflammatory changes with hepatic hypodensities with peripheral enhancement c/w abscesses (although a neoplastic process cannot be excluded). RUQ US showed multiple gallstones measuring up to 1.9 cm with diffuse gallbladder wall thickening of 0.6 cm.  Recommend IR percutaneous Cholecystostomy Tube given the degree of inflammation on imaging.  Cardiology was requested for medical clearance.  Patient tolerated the procedure well without any issues.  Overall he is doing better, patient is stable to be discharged home with recommendations as stated above.     Assessment & Plan:  Principal Problem:   Acute cholecystitis Active Problems:   Cocaine abuse (HCC)   Hyperlipidemia   S/P CABG x 3   Bipolar 1 disorder   Essential hypertension   Tobacco user   Type 2 diabetes mellitus without complications   COPD with chronic bronchitis   Chronic back  pain      Acute cholecystitis status post cholecystostomy 4/1 -Seen by general surgery.  Given degree of inflammation, drain placement by IR Will transition antibiotics to oral Augmentin for total 10-day course.  Follow-up outpatient general surgery, to be addressed by their service  IR services provided appropriate instructions on drain management and follow-up care   COPD with acute exacerbation, now resolved In mild exacerbation currently on bronchodilators, steroids and nebs Complete 5 days of total oral doxycycline   CAD s/p CABG History of prior CVA -Currently chest pain-free.  Continue home medications.  Resume Plavix Patient was never on aspirin prior to admission.  Continue statin   Bipolar (? Schizophrenia) Will continue meds upon discharge   Chronic back pain Pain control as needed   Cocaine abuse Last cocaine use about 2 days ago.  Counseled to quit using this     DM A1c 6.1. Sliding scle and accuchecks.  Tells me he is not on metformin at home   HTN Tells me he does not take Coreg.  This can be addressed outpatient depending on his blood pressure readings.   HLD Statin   Tobacco dependence Counseled to quit using it.     Consultations: General surgery and IR  Subjective: Feeling well no complaints.  Discharge Exam: Vitals:   06/13/22 0627 06/13/22 0816  BP: 138/81 137/83  Pulse: 79 75  Resp: 16 16  Temp: 98.4 F (36.9 C) 97.8 F (36.6 C)  SpO2: 99% 100%   Vitals:   06/12/22 1704 06/12/22 2138 06/13/22 0627 06/13/22 0816  BP: (!) 138/95 (!) 154/100 138/81 137/83  Pulse: 69 71 79 75  Resp: 17 16 16  16  Temp: 97.9 F (36.6 C) 97.6 F (36.4 C) 98.4 F (36.9 C) 97.8 F (36.6 C)  TempSrc: Oral  Oral Oral  SpO2: 98% 99% 99% 100%  Weight:      Height:        General: Pt is alert, awake, not in acute distress Cardiovascular: RRR, S1/S2 +, no rubs, no gallops Respiratory: CTA bilaterally, no wheezing, no rhonchi Abdominal: Soft, NT,  ND, bowel sounds + Extremities: no edema, no cyanosis RUQ drain  Discharge Instructions   Allergies as of 06/13/2022       Reactions   Glucophage [metformin] Other (See Comments)   Weight loss        Medication List     STOP taking these medications    carvedilol 12.5 MG tablet Commonly known as: COREG   ezetimibe 10 MG tablet Commonly known as: ZETIA   metFORMIN 500 MG tablet Commonly known as: GLUCOPHAGE   traZODone 100 MG tablet Commonly known as: DESYREL       TAKE these medications    albuterol 108 (90 Base) MCG/ACT inhaler Commonly known as: Ventolin HFA Inhale 2 puffs into the lungs every 6 (six) hours as needed for wheezing or shortness of breath.   amoxicillin-clavulanate 875-125 MG tablet Commonly known as: AUGMENTIN Take 1 tablet by mouth every 12 (twelve) hours for 6 days.   aspirin EC 325 MG tablet Take 1 tablet (325 mg total) by mouth daily. For one month then stop   atorvastatin 80 MG tablet Commonly known as: LIPITOR Take 1 tablet (80 mg total) by mouth daily at 6 PM.   budesonide-formoterol 160-4.5 MCG/ACT inhaler Commonly known as: Symbicort Inhale 2 puffs into the lungs 2 (two) times daily. What changed:  how much to take when to take this reasons to take this   clopidogrel 75 MG tablet Commonly known as: PLAVIX Take 1 tablet (75 mg total) by mouth daily.   fluticasone 50 MCG/ACT nasal spray Commonly known as: FLONASE USE 2 SPRAYS IN EACH NOSTRIL EVERY DAY   pantoprazole 40 MG tablet Commonly known as: PROTONIX Take 1 tablet (40 mg total) by mouth EVERY DAY WITH BREAKFAST What changed:  how much to take how to take this when to take this additional instructions   QUEtiapine 100 MG tablet Commonly known as: SEROquel Take 1 tablet (100 mg total) by mouth at bedtime.        Follow-up Information     Harriette Bouillonornett, Thomas, MD. Go on 07/07/2022.   Specialty: General Surgery Why: Your appointment is 4/29 at 2:40pm Arrive  30min early to check in, fill out paperwork, Bring photo ID and insurance information Contact information: [address] [place] [address] [phone]         Berdine DanceShick, Michael, MD. Nyra CapesGo on 07/02/2022.   Specialties: Interventional Radiology, Radiology Why: You have been scheduled for an appointment with our Interventional Radiology team to assess your drain, 07/02/22 at 12:40.   Please call or [phone] with any questions or concerns prior to your appointment. Contact information: [address] [place] [address] [phone]         Storm FriskWright, Patrick E, MD Follow up in 1 week(s).   Specialty: Pulmonary Disease Contact information: [address] [place] [address] [phone]                Allergies  Allergen Reactions   Glucophage [Metformin] Other (See Comments)    Weight loss  You were cared for by a hospitalist during your hospital stay. If you have any questions about your discharge medications or the care you received while you were in the hospital after you are discharged, you can call the unit and asked to speak with the hospitalist on call if the hospitalist that took care of you is not available. Once you are discharged, your primary care physician will handle any further medical issues. Please note that no refills for any discharge medications will be authorized once you are discharged, as it is imperative that you return to your primary care physician (or establish a relationship with a primary care physician if you do not have one) for your aftercare needs so that they can reassess your need for medications and monitor your lab values.  You were cared for by a hospitalist during your hospital stay. If you have any questions about your discharge medications or the care you received while you were in the hospital after you are discharged, you can call the unit and asked to speak with the hospitalist on call if  the hospitalist that took care of you is not available. Once you are discharged, your primary care physician will handle any further medical issues. Please note that NO REFILLS for any discharge medications will be authorized once you are discharged, as it is imperative that you return to your primary care physician (or establish a relationship with a primary care physician if you do not have one) for your aftercare needs so that they can reassess your need for medications and monitor your lab values.  Please request your Prim.MD to go over all Hospital Tests and Procedure/Radiological results at the follow up, please get all Hospital records sent to your Prim MD by signing hospital release before you go home.  Get CBC, CMP, 2 view Chest X ray checked  by Primary MD during your next visit or SNF MD in 5-7 days ( we routinely change or add medications that can affect your baseline labs and fluid status, therefore we recommend that you get the mentioned basic workup next visit with your PCP, your PCP may decide not to get them or add new tests based on their clinical decision)  On your next visit with your primary care physician please Get Medicines reviewed and adjusted.  If you experience worsening of your admission symptoms, develop shortness of breath, life threatening emergency, suicidal or homicidal thoughts you must seek medical attention immediately by calling 911 or calling your MD immediately  if symptoms less severe.  You Must read complete instructions/literature along with all the possible adverse reactions/side effects for all the Medicines you take and that have been prescribed to you. Take any new Medicines after you have completely understood and accpet all the possible adverse reactions/side effects.   Do not drive, operate heavy machinery, perform activities at heights, swimming or participation in water activities or provide baby sitting services if your were admitted for syncope or  siezures until you have seen by Primary MD or a Neurologist and advised to do so again.  Do not drive when taking Pain medications.   Procedures/Studies: ECHOCARDIOGRAM COMPLETE  Result Date: 06/10/2022    ECHOCARDIOGRAM REPORT   Patient Name:   RONRICO DUPIN Date of Exam: 06/10/2022 Medical Rec #:  [id number]      Height:       70.0 in Accession #:    [id number]     Weight:       143.0 lb  Date of Birth:  1951-02-23       BSA:          1.810 m Patient Age:    72 years       BP:           142/86 mmHg Patient Gender: M              HR:           91 bpm. Exam Location:  Inpatient Procedure: 2D Echo, Color Doppler, Cardiac Doppler and Strain Analysis Indications:    Preop eval, Anasarca  History:        Patient has prior history of Echocardiogram examinations, most                 recent 06/29/2015. CAD and Previous Myocardial Infarction, COPD;                 Risk Factors:Dyslipidemia, Hypertension, Current Smoker and                 Diabetes. Cocaine Abuse, CABG x3,.  Sonographer:    Milbert Coulter Referring Phys: [id number] SHENG L HALEY IMPRESSIONS  1. Severe biventricular hypertrophy with septal thickness 23 mm at greatest and decreased global longitudinal strain spairing the true apex. Left ventricular ejection fraction, by estimation, is 30 to 35%. The left ventricle has moderately decreased function. The left ventricle demonstrates global hypokinesis. There is severe concentric left ventricular hypertrophy. Left ventricular diastolic parameters are indeterminate. The average left ventricular global longitudinal strain is -4.6 %. The global longitudinal strain is abnormal.  2. Right ventricular systolic function is moderately reduced. The right ventricular size is normal. Moderately increased right ventricular wall thickness. Tricuspid regurgitation signal is inadequate for assessing PA pressure.  3. The mitral valve is abnormal. No evidence of mitral valve regurgitation. No evidence of mitral stenosis.  4. The  aortic valve is calcified. There is mild calcification of the aortic valve. Aortic valve regurgitation is not visualized. No aortic stenosis is present. Comparison(s): No prior Echocardiogram. Conclusion(s)/Recommendation(s): Severe biventricular hypertrophy with cardiomyopathy. Consider outpatient evaluation for infiltrative disease. FINDINGS  Left Ventricle: Severe biventricular hypertrophy with septal thickness 23 mm at greatest and decreased global longitudinal strain spairing the true apex. Left ventricular ejection fraction, by estimation, is 30 to 35%. The left ventricle has moderately decreased function. The left ventricle demonstrates global hypokinesis. The average left ventricular global longitudinal strain is -4.6 %. The global longitudinal strain is abnormal. The left ventricular internal cavity size was small. There is severe concentric left ventricular hypertrophy. Left ventricular diastolic parameters are indeterminate. Right Ventricle: The right ventricular size is normal. Moderately increased right ventricular wall thickness. Right ventricular systolic function is moderately reduced. Tricuspid regurgitation signal is inadequate for assessing PA pressure. Left Atrium: Left atrial size was normal in size. Right Atrium: Right atrial size was normal in size. Pericardium: There is no evidence of pericardial effusion. Mitral Valve: The mitral valve is abnormal. No evidence of mitral valve regurgitation. No evidence of mitral valve stenosis. Tricuspid Valve: The tricuspid valve is normal in structure. Tricuspid valve regurgitation is not demonstrated. No evidence of tricuspid stenosis. Aortic Valve: The aortic valve is calcified. There is mild calcification of the aortic valve. Aortic valve regurgitation is not visualized. No aortic stenosis is present. Aortic valve mean gradient measures 3.0 mmHg. Aortic valve peak gradient measures 5.6 mmHg. Aortic valve area, by VTI measures 2.55 cm. Pulmonic Valve:  The pulmonic valve was normal in structure. Pulmonic valve regurgitation  is not visualized. No evidence of pulmonic stenosis. Aorta: The aortic root is normal in size and structure. IAS/Shunts: No atrial level shunt detected by color flow Doppler.  LEFT VENTRICLE PLAX 2D LVIDd:         3.20 cm     Diastology LVIDs:         2.30 cm     LV e' medial:    4.68 cm/s LV PW:         2.30 cm     LV E/e' medial:  10.3 LV IVS:        2.30 cm     LV e' lateral:   7.72 cm/s LVOT diam:     2.10 cm     LV E/e' lateral: 6.3 LV SV:         42 LV SV Index:   23          2D Longitudinal Strain LVOT Area:     3.46 cm    2D Strain GLS Avg:     -4.6 %  LV Volumes (MOD) LV vol d, MOD A2C: 41.2 ml LV vol d, MOD A4C: 46.6 ml LV vol s, MOD A2C: 17.2 ml LV vol s, MOD A4C: 27.1 ml LV SV MOD A2C:     24.0 ml LV SV MOD A4C:     46.6 ml LV SV MOD BP:      21.8 ml RIGHT VENTRICLE RV Basal diam:  2.50 cm RV Mid diam:    1.90 cm RV S prime:     8.92 cm/s TAPSE (M-mode): 0.8 cm LEFT ATRIUM             Index        RIGHT ATRIUM           Index LA diam:        4.30 cm 2.38 cm/m   RA Area:     13.70 cm LA Vol (A2C):   53.8 ml 29.72 ml/m  RA Volume:   32.10 ml  17.74 ml/m LA Vol (A4C):   36.8 ml 20.33 ml/m LA Biplane Vol: 47.2 ml 26.08 ml/m  AORTIC VALVE AV Area (Vmax):    2.45 cm AV Area (Vmean):   2.46 cm AV Area (VTI):     2.55 cm AV Vmax:           118.00 cm/s AV Vmean:          83.300 cm/s AV VTI:            0.163 m AV Peak Grad:      5.6 mmHg AV Mean Grad:      3.0 mmHg LVOT Vmax:         83.60 cm/s LVOT Vmean:        59.200 cm/s LVOT VTI:          0.120 m LVOT/AV VTI ratio: 0.74  AORTA Ao Root diam: 3.40 cm MITRAL VALVE MV Area (PHT): 2.93 cm    SHUNTS MV Decel Time: 259 msec    Systemic VTI:  0.12 m MV E velocity: 48.40 cm/s  Systemic Diam: 2.10 cm MV A velocity: 48.40 cm/s MV E/A ratio:  1.00 Riley Lam MD Electronically signed by Riley Lam MD Signature Date/Time: 06/10/2022/12:28:03 PM    Final    IR Perc  Cholecystostomy  Result Date: 06/10/2022 INDICATION: ACUTE CALCULUS CHOLECYSTITIS EXAM: Ultrasound and fluoroscopic percutaneous cholecystostomy MEDICATIONS: Patient is already receiving IV Zosyn as an inpatient; The antibiotic was administered within an  appropriate time frame prior to the initiation of the procedure. ANESTHESIA/SEDATION: Moderate (conscious) sedation was employed during this procedure. A total of Versed 1.0 mg and Fentanyl 50 mcg was administered intravenously by the radiology nurse. Total intra-service moderate Sedation Time: 12 minutes. The patient's level of consciousness and vital signs were monitored continuously by radiology nursing throughout the procedure under my direct supervision. FLUOROSCOPY: Radiation Exposure Index (as provided by the fluoroscopic device): 1.0 mGy Kerma COMPLICATIONS: None immediate. PROCEDURE: Informed written consent was obtained from the patient after a thorough discussion of the procedural risks, benefits and alternatives. All questions were addressed. Maximal Sterile Barrier Technique was utilized including caps, mask, sterile gowns, sterile gloves, sterile drape, hand hygiene and skin antiseptic. A timeout was performed prior to the initiation of the procedure. Previous imaging reviewed. Preliminary ultrasound performed. The gallbladder was localized in the mid axillary line through a lower intercostal space. Overlying skin marked. Under sterile conditions and local anesthesia, ultrasound percutaneous needle access performed of the gallbladder. Needle position confirmed with ultrasound. Images obtained for documentation. There was return of bile. Sample sent for culture. Guidewire inserted followed by the transitional dilator set. Amplatz guidewire advanced. Tract dilatation performed to insert a 10 [place] drain. Retention loop formed in the gallbladder proximally. Position confirmed with ultrasound and fluoroscopy. 30 cc exudative bile aspirated. Catheter  secured with a Prolene suture. Sterile dressing applied. Gravity drainage bag connected. Patient tolerated the procedure well. IMPRESSION: Successful ultrasound and fluoroscopic cholecystostomy placement. Electronically Signed   By: Judie Petit.  Shick M.D.   On: 06/10/2022 10:42   US Abdomen Limited RUQ (LIVER/GB)  Result Date: 06/09/2022 CLINICAL DATA:  Right upper quadrant pain EXAM: ULTRASOUND ABDOMEN LIMITED RIGHT UPPER QUADRANT COMPARISON:  CT 06/09/2022 FINDINGS: Gallbladder: Multiple echogenic, shadowing stones within the gallbladder measuring up to 1.9 cm in diameter. Diffuse gallbladder wall thickening of 0.6 cm. A positive sonographic Murphy sign noted by sonographer. Common bile duct: Diameter: 3 mm. Liver: No focal lesion or intraparenchymal fluid collection identified with attention to the gallbladder fossa. Within normal limits in parenchymal echogenicity. Portal vein is patent on color Doppler imaging with normal direction of blood flow towards the liver. Other: None. IMPRESSION: 1. Sonographic findings compatible with acute cholecystitis. 2. No focal lesion or intraparenchymal fluid collection identified with attention to the gallbladder fossa to correspond to abnormality seen on the prior CT. Electronically Signed   By: Duanne Guess D.O.   On: 06/09/2022 13:48   CT ABDOMEN PELVIS W CONTRAST  Result Date: 06/09/2022 CLINICAL DATA:  Right upper quadrant pain. EXAM: CT ABDOMEN AND PELVIS WITH CONTRAST TECHNIQUE: Multidetector CT imaging of the abdomen and pelvis was performed using the standard protocol following bolus administration of intravenous contrast. RADIATION DOSE REDUCTION: This exam was performed according to the departmental dose-optimization program which includes automated exposure control, adjustment of the mA and/or kV according to patient size and/or use of iterative reconstruction technique. CONTRAST:  75mL OMNIPAQUE IOHEXOL 350 MG/ML SOLN COMPARISON:  08/07/2012 FINDINGS: Lower  chest: Cardiomegaly.  No pericardial or pleural effusion. Hepatobiliary: Grossly abnormal appearance of the gallbladder with pericholecystic fluid and areas of hypoattenuation surrounded by enhancement in the adjacent hepatic parenchyma that could represent abscesses. There are few peripherally enhancing lesions in the liver that could be abscesses in the setting of cholecystitis. A neoplastic etiology is not excluded. Pancreas: Unremarkable. No pancreatic ductal dilatation or surrounding inflammatory changes. Spleen: Normal in size without focal abnormality. Adrenals/Urinary Tract: No adrenal lesions are identified. No hydronephrosis or stones  identified. Urinary bladder wall thickening is nonspecific can be seen with inflammation or underdistention. Stomach/Bowel: Evaluation for bowel pathology was limited by lack of GI contrast. No bowel dilatation to suggest obstruction. Unremarkable appendix. Vascular/Lymphatic: Aortic atherosclerosis. No enlarged abdominal or pelvic lymph nodes. Reproductive: Prostate is unremarkable. Other: Diffuse subcutaneous and mesenteric edema consistent with anasarca. Musculoskeletal: Thoracolumbar degenerative changes. There is anterior wedge compression deformity of L1 which is new since 2014 and age-indeterminate. IMPRESSION: 1. Abnormal gallbladder with adjacent fluid and inflammatory changes. 2. Hepatic hypodensities with peripheral enhancement that could represent abscesses in the setting of infection although a neoplastic process cannot be excluded. 3. Nonspecific urinary bladder wall thickening. 4. Anasarca. 5. Age-indeterminate L1 compression deformity and thoracolumbar degenerative changes. Electronically Signed   By: Layla MawJoshua  Pleasure M.D.   On: 06/09/2022 11:41     The results of significant diagnostics from this hospitalization (including imaging, microbiology, ancillary and laboratory) are listed below for reference.     Microbiology: Recent Results (from the past  240 hour(s))  Aerobic/Anaerobic Culture w Gram Stain (surgical/deep wound)     Status: None (Preliminary result)   Collection Time: 06/10/22 10:37 AM   Specimen: Gallbladder; Abscess  Result Value Ref Range Status   Specimen Description GALL BLADDER  Final   Special Requests NONE  Final   Gram Stain   Final    ABUNDANT WBC PRESENT, PREDOMINANTLY PMN NO ORGANISMS SEEN    Culture   Final    NO GROWTH 2 DAYS NO ANAEROBES ISOLATED; CULTURE IN PROGRESS FOR 5 DAYS Performed at [facility], [address] [place], [place] [address]    Report Status PENDING  Incomplete     Labs: BNP (last 3 results) No results for input(s): "BNP" in the last 8760 hours. Basic Metabolic Panel: Recent Labs  Lab 06/09/22 0851 06/10/22 0530 06/11/22 0314 06/12/22 0410 06/13/22 0233  NA 132* 130* 132* 133* 133*  K 3.3* 3.6 3.3* 4.2 3.8  CL 98 94* 98 104 99  CO2 23 25 25 24 26   GLUCOSE 142* 112* 102* 115* 131*  BUN 6* <5* 9 12 11   CREATININE 0.67 0.87 0.83 0.80 0.81  CALCIUM 8.7* 8.9 8.4* 8.4* 8.7*  MG  --   --  1.7 1.8 1.6*   Liver Function Tests: Recent Labs  Lab 06/09/22 0851 06/10/22 0530 06/11/22 0314 06/12/22 0410 06/13/22 0233  AST 27 27 21 24 28   ALT 13 13 9 11 15   ALKPHOS 112 122 84 81 90  BILITOT 1.5* 2.5* 2.6* 1.7* 1.0  PROT 7.0 7.0 5.6* 5.6* 5.9*  ALBUMIN 3.6 3.3* 2.6* 2.5* 2.7*   Recent Labs  Lab 06/09/22 0851  LIPASE 21   No results for input(s): "AMMONIA" in the last 168 hours. CBC: Recent Labs  Lab 06/09/22 0851 06/10/22 0530 06/11/22 0314 06/12/22 0410 06/13/22 0233  WBC 11.3* 17.7* 12.7* 12.8* 15.3*  NEUTROABS 9.1*  --   --   --   --   HGB 13.4 15.3 12.7* 11.8* 12.6*  HCT 41.0 44.5 36.9* 34.5* 38.6*  MCV 83.7 80.5 80.4 81.2 83.2  PLT 233 283 216 211 253   Cardiac Enzymes: No results for input(s): "CKTOTAL", "CKMB", "CKMBINDEX", "TROPONINI" in the last 168 hours. BNP: Invalid input(s): "POCBNP" CBG: Recent Labs  Lab 06/12/22 1149  06/12/22 1701 06/12/22 2138 06/13/22 0844 06/13/22 1128  GLUCAP 152* 175* 129* 102* 166*   D-Dimer No results for input(s): "DDIMER" in the last 72 hours. Hgb A1c No results for  input(s): "HGBA1C" in the last 72 hours. Lipid Profile No results for input(s): "CHOL", "HDL", "LDLCALC", "TRIG", "CHOLHDL", "LDLDIRECT" in the last 72 hours. Thyroid function studies No results for input(s): "TSH", "T4TOTAL", "T3FREE", "THYROIDAB" in the last 72 hours.  Invalid input(s): "FREET3" Anemia work up No results for input(s): "VITAMINB12", "FOLATE", "FERRITIN", "TIBC", "IRON", "RETICCTPCT" in the last 72 hours. Urinalysis    Component Value Date/Time   COLORURINE YELLOW 06/09/2022 0928   APPEARANCEUR CLEAR 06/09/2022 0928   LABSPEC 1.018 06/09/2022 0928   PHURINE 6.0 06/09/2022 0928   GLUCOSEU NEGATIVE 06/09/2022 0928   HGBUR NEGATIVE 06/09/2022 0928   BILIRUBINUR NEGATIVE 06/09/2022 0928   KETONESUR 20 (A) 06/09/2022 0928   PROTEINUR 30 (A) 06/09/2022 0928   NITRITE NEGATIVE 06/09/2022 0928   LEUKOCYTESUR NEGATIVE 06/09/2022 0928   Sepsis Labs Recent Labs  Lab 06/10/22 0530 06/11/22 0314 06/12/22 0410 06/13/22 0233  WBC 17.7* 12.7* 12.8* 15.3*   Microbiology Recent Results (from the past 240 hour(s))  Aerobic/Anaerobic Culture w Gram Stain (surgical/deep wound)     Status: None (Preliminary result)   Collection Time: 06/10/22 10:37 AM   Specimen: Gallbladder; Abscess  Result Value Ref Range Status   Specimen Description GALL BLADDER  Final   Special Requests NONE  Final   Gram Stain   Final    ABUNDANT WBC PRESENT, PREDOMINANTLY PMN NO ORGANISMS SEEN    Culture   Final    NO GROWTH 2 DAYS NO ANAEROBES ISOLATED; CULTURE IN PROGRESS FOR 5 DAYS Performed at [facility], [address], [place], [place] [address]    Report Status PENDING  Incomplete     Time coordinating discharge:  I have spent 35 minutes face to face with the patient and on the ward discussing  the patients care, assessment, plan and disposition with other care givers. >50% of the time was devoted counseling the patient about the risks and benefits of treatment/Discharge disposition and coordinating care.   SIGNED:   Dimple Nanas, MD  [facility] 06/13/2022, 11:47 AM   If 7PM-7AM, please contact night-coverage

## 2022-06-13 NOTE — Progress Notes (Signed)
Bedside report given to oncoming nurse, Patient alert and responsive denies pain or discomfort, no acute changes, slept 8 hours during shift. All safety/fall precautions maintained, call bell at reach.

## 2022-06-13 NOTE — TOC Progression Note (Signed)
Transition of Care [facility]) - Progression Note    Patient Details  Name: Andre Rojas MRN: [id number] Date of Birth: 1951-03-08  [facility] CM/SW Contact  Carley Hammed, [place] Phone Number: 06/13/2022, 10:58 AM  Clinical Narrative:     CSW met with pt at bedside to review discharge plan. Pt understands that no other shelter beds are available, and he will need to return to the [facility]. He was advised to continue attempting to get another shelter bed when one opens, and to utilize the other resources that were provided. He was also advised to utilize the resources to get substance use treatment. Pt notes understanding and requests a taxi to the [facility]. This was filled out and left with the RN to call when pt is ready. TOC will cosign off at this time.   Expected Discharge Plan: Homeless Shelter Barriers to Discharge: Continued Medical Work up  Expected Discharge Plan and Services     Post Acute Care Choice:  (Shelter) Living arrangements for the past 2 months: Homeless Shelter Expected Discharge Date: 06/13/22                                     Social Determinants of Health (SDOH) Interventions SDOH Screenings   Alcohol Screen: Low Risk  (08/15/2020)  Depression (PHQ2-9): Low Risk  (08/15/2020)  Tobacco Use: High Risk (06/10/2022)    Readmission Risk Interventions     No data to display

## 2022-06-15 LAB — AEROBIC/ANAEROBIC CULTURE W GRAM STAIN (SURGICAL/DEEP WOUND)

## 2022-07-01 ENCOUNTER — Encounter: Payer: Self-pay | Admitting: Physician Assistant

## 2022-07-01 ENCOUNTER — Ambulatory Visit: Payer: Medicare HMO | Attending: Physician Assistant | Admitting: Cardiology

## 2022-07-01 VITALS — BP 110/50 | HR 74 | Ht 70.0 in | Wt 138.4 lb

## 2022-07-01 DIAGNOSIS — K219 Gastro-esophageal reflux disease without esophagitis: Secondary | ICD-10-CM

## 2022-07-01 DIAGNOSIS — I2510 Atherosclerotic heart disease of native coronary artery without angina pectoris: Secondary | ICD-10-CM | POA: Diagnosis not present

## 2022-07-01 DIAGNOSIS — F1410 Cocaine abuse, uncomplicated: Secondary | ICD-10-CM

## 2022-07-01 DIAGNOSIS — E782 Mixed hyperlipidemia: Secondary | ICD-10-CM

## 2022-07-01 DIAGNOSIS — Z951 Presence of aortocoronary bypass graft: Secondary | ICD-10-CM

## 2022-07-01 DIAGNOSIS — I5021 Acute systolic (congestive) heart failure: Secondary | ICD-10-CM

## 2022-07-01 DIAGNOSIS — I519 Heart disease, unspecified: Secondary | ICD-10-CM

## 2022-07-01 DIAGNOSIS — Z720 Tobacco use: Secondary | ICD-10-CM

## 2022-07-01 DIAGNOSIS — J4489 Other specified chronic obstructive pulmonary disease: Secondary | ICD-10-CM

## 2022-07-01 MED ORDER — CLOPIDOGREL BISULFATE 75 MG PO TABS: 75.0000 mg | ORAL_TABLET | Freq: Every day | ORAL | 3 refills | Status: DC

## 2022-07-01 MED ORDER — PANTOPRAZOLE SODIUM 40 MG PO TBEC
40.0000 mg | DELAYED_RELEASE_TABLET | Freq: Every day | ORAL | 0 refills | Status: AC
Start: 2022-07-01 — End: 2022-07-31

## 2022-07-01 MED ORDER — ATORVASTATIN CALCIUM 80 MG PO TABS: 80.0000 mg | ORAL_TABLET | Freq: Every day | ORAL | 3 refills | Status: DC

## 2022-07-01 MED ORDER — CARVEDILOL 3.125 MG PO TABS: 3.1250 mg | ORAL_TABLET | Freq: Two times a day (BID) | ORAL | 3 refills | Status: DC

## 2022-07-01 MED ORDER — BUDESONIDE-FORMOTEROL FUMARATE 160-4.5 MCG/ACT IN AERO
2.0000 | INHALATION_SPRAY | Freq: Two times a day (BID) | RESPIRATORY_TRACT | 0 refills | Status: DC
Start: 2022-07-01 — End: 2022-10-10

## 2022-07-01 NOTE — Patient Instructions (Signed)
Medication Instructions:  Your physician has recommended you make the following change in your medication: 1.  START Carvedilol 3.125 mg taking 1 twice a day  I have sent in refills for all of your other medications  *If you need a refill on your cardiac medications before your next appointment, please call your pharmacy*   Lab Work: TODAY:  BMET & PRO BNP  If you have labs (blood work) drawn today and your tests are completely normal, you will receive your results only by: MyChart Message (if you have MyChart) OR A paper copy in the mail If you have any lab test that is abnormal or we need to change your treatment, we will call you to review the results.   Testing/Procedures: Your physician has requested that you have a cardiac MRI. Cardiac MRI uses a computer to create images of your heart as its beating, producing both still and moving pictures of your heart and major blood vessels. For further information please visit [email or web address]. Please follow the instruction sheet given to you today for more information.    Follow-Up: At [facility], you and your health needs are our priority.  As part of our continuing mission to provide you with exceptional heart care, we have created designated Provider Care Teams.  These Care Teams include your primary Cardiologist (physician) and Advanced Practice Providers (APPs -  Physician Assistants and Nurse Practitioners) who all work together to provide you with the care you need, when you need it.  We recommend signing up for the patient portal called "MyChart".  Sign up information is provided on this After Visit Summary.  MyChart is used to connect with patients for Virtual Visits (Telemedicine).  Patients are able to view lab/test results, encounter notes, upcoming appointments, etc.  Non-urgent messages can be sent to your provider as well.   To learn more about what you can do with MyChart, go to [email or web address].    Your  next appointment:   3 month(s)  Provider:   Meriam Sprague, MD  or Jari Favre, PA-C, Ronie Spies, PA-C, Robin Searing, NP, Jacolyn Reedy, PA-C, Eligha Bridegroom, NP, or Tereso Newcomer, PA-C         Other Instructions

## 2022-07-01 NOTE — Progress Notes (Signed)
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 and Advanced Practice Providers (APPs -  Physician Assistants and Nurse Practitioners) who all work together to provide you with the care you need, when you need it.  We recommend signing up for the patient portal called "MyChart".  Sign up information is provided on this After Visit Summary.  MyChart is used to connect with patients for Virtual Visits (Telemedicine).  Patients are able to view lab/test results, encounter notes, upcoming appointments, etc.  Non-urgent messages can be sent to your provider as well.   To learn more about what you can do with MyChart, go to [email or web address].    Your next  appointment:   3 month(s)  Provider:   Meriam Sprague, MD  or Jari Favre, PA-C, Ronie Spies, PA-C, Robin Searing, NP, Jacolyn Reedy, PA-C, Eligha Bridegroom, NP, or Tereso Newcomer, PA-C         Other Instructions    Signed, Jonita Albee, PA-C  07/01/2022 12:58 PM    [facility]

## 2022-07-02 ENCOUNTER — Inpatient Hospital Stay: Admission: RE | Admit: 2022-07-02 | Payer: Medicare HMO | Source: Ambulatory Visit

## 2022-07-02 ENCOUNTER — Telehealth: Payer: Self-pay | Admitting: Cardiology

## 2022-07-02 ENCOUNTER — Inpatient Hospital Stay
Admission: RE | Admit: 2022-07-02 | Discharge: 2022-07-02 | Disposition: A | Discharge status: Home / Self Care | Payer: Medicare HMO | Source: Ambulatory Visit | Attending: Physician Assistant | Admitting: Physician Assistant

## 2022-07-02 LAB — BASIC METABOLIC PANEL
BUN/Creatinine Ratio: 11 (ref 10–24)
BUN: 8 mg/dL (ref 8–27)
CO2: 23 mmol/L (ref 20–29)
Calcium: 9.7 mg/dL (ref 8.6–10.2)
Chloride: 101 mmol/L (ref 96–106)
Creatinine, Ser: 0.73 mg/dL — ABNORMAL LOW (ref 0.76–1.27)
Glucose: 80 mg/dL (ref 70–99)
Potassium: 4.7 mmol/L (ref 3.5–5.2)
Sodium: 141 mmol/L (ref 134–144)
eGFR: 98 mL/min/{1.73_m2} (ref >59)

## 2022-07-02 LAB — PRO B NATRIURETIC PEPTIDE: NT-Pro BNP: 1509 pg/mL — ABNORMAL HIGH (ref 0–376)

## 2022-07-02 NOTE — Telephone Encounter (Signed)
Attempted to contact patient to discuss recent lab results and medication changes. Phone number listed on chart is non-working number.  Called and left message for patient's son Griffin, Gerrard at [phone] to callback to discuss best contact for patient.

## 2022-07-31 ENCOUNTER — Encounter: Payer: Self-pay | Admitting: *Deleted

## 2022-08-06 ENCOUNTER — Ambulatory Visit (HOSPITAL_COMMUNITY): Admission: RE | Admit: 2022-08-06 | Payer: Medicare HMO | Source: Ambulatory Visit

## 2022-08-07 ENCOUNTER — Telehealth: Payer: Self-pay | Admitting: Cardiology

## 2022-08-07 NOTE — Telephone Encounter (Signed)
The office has made several attempts in the past month to contact the patient. Spoke to the patient, he denies any swelling. Will forward to APP if pt should have repeat BMET prior to taking lasix. Pt voiced understanding.

## 2022-08-07 NOTE — Telephone Encounter (Signed)
Patient called to follow-up on test results. 

## 2022-08-08 ENCOUNTER — Ambulatory Visit (HOSPITAL_COMMUNITY)
Admission: RE | Admit: 2022-08-08 | Discharge: 2022-08-08 | Disposition: A | Discharge status: Home / Self Care | Payer: Medicare HMO | Source: Ambulatory Visit | Attending: Physician Assistant | Admitting: Physician Assistant

## 2022-08-08 ENCOUNTER — Other Ambulatory Visit (HOSPITAL_COMMUNITY): Payer: Self-pay | Admitting: Interventional Radiology

## 2022-08-08 DIAGNOSIS — K810 Acute cholecystitis: Secondary | ICD-10-CM | POA: Diagnosis present

## 2022-08-08 DIAGNOSIS — K8001 Calculus of gallbladder with acute cholecystitis with obstruction: Secondary | ICD-10-CM

## 2022-08-08 HISTORY — PX: IR CATHETER TUBE CHANGE: IMG717

## 2022-08-08 HISTORY — PX: IR EXCHANGE BILIARY DRAIN: IMG6046

## 2022-08-08 MED ORDER — LIDOCAINE HCL 1 % IJ SOLN
INTRAMUSCULAR | Status: AC
  Filled 2022-08-08: qty 20

## 2022-08-08 MED ORDER — IOHEXOL 300 MG/ML  SOLN
50.0000 mL | Freq: Once | INTRAMUSCULAR | Status: AC | PRN
  Administered 2022-08-08: 20 mL

## 2022-08-08 NOTE — Telephone Encounter (Signed)
Attempted to call patient to share response from Robet Leu, PA-C:  At this point, it appears his labs are over 85 month old. If he is not have swelling or shortness of breath, he should not take lasix at this time. If he is having shortness of breath, swelling, weight gain, etc, he should come back in for both a BNP  and a BMP    The person who answered the phone states she and the patient are at the hospital in radiology having a test performed and will callback when they are finished to discuss what APP recommended.

## 2022-08-08 NOTE — Procedures (Signed)
Interventional Radiology Procedure Note  Procedure: unsuccessful cholecystostomy exchg    Complications: None  Estimated Blood Loss:  0  Findings: Tube was a tranperitoneal puncture in April. Tube was retracted into the fundus.  Unable to exchg and access lost.  Could not regain access.  By [place], GB is collapsed around the stones and the cystic duct is patent.  Will d/c with plans to watch for recurrent cholecystitis and he has surgery followup later this month.  Pt and sister agree with plan.    Sharen Counter, MD

## 2022-08-26 ENCOUNTER — Ambulatory Visit: Payer: Self-pay | Admitting: Surgery

## 2022-08-26 ENCOUNTER — Telehealth: Payer: Self-pay | Admitting: *Deleted

## 2022-08-26 ENCOUNTER — Encounter (HOSPITAL_COMMUNITY): Payer: Self-pay

## 2022-08-26 ENCOUNTER — Encounter (HOSPITAL_COMMUNITY): Payer: Self-pay | Admitting: Interventional Radiology

## 2022-08-26 ENCOUNTER — Other Ambulatory Visit (HOSPITAL_COMMUNITY): Payer: Self-pay | Admitting: Physician Assistant

## 2022-08-26 DIAGNOSIS — R1011 Right upper quadrant pain: Secondary | ICD-10-CM

## 2022-08-26 DIAGNOSIS — F319 Bipolar disorder, unspecified: Secondary | ICD-10-CM

## 2022-08-26 DIAGNOSIS — K810 Acute cholecystitis: Secondary | ICD-10-CM

## 2022-08-26 DIAGNOSIS — K219 Gastro-esophageal reflux disease without esophagitis: Secondary | ICD-10-CM

## 2022-08-26 NOTE — Telephone Encounter (Signed)
   Pre-operative Risk Assessment    Patient Name: Andre Rojas  DOB: [birth year] MRN: [id number]      Request for Surgical Clearance    Procedure:   GALLBLADDER SURGERY  Date of Surgery:  Clearance TBD                                 Surgeon:  DR. Harriette Bouillon Surgeon's Group or Practice Name:  [facility] Phone number:  [phone] Fax number:  [phone] ATTN: Brennan Bailey, CMA   Type of Clearance Requested:   - Medical  - Pharmacy:  Hold Clopidogrel (Plavix)     Type of Anesthesia:  General    Additional requests/questions:    Elpidio Anis   08/26/2022, 11:31 AM

## 2022-08-26 NOTE — Telephone Encounter (Signed)
Pt has in office appt with Eligha Bridegroom, NP 10/06/22. I will update all parties involved.

## 2022-08-26 NOTE — Telephone Encounter (Signed)
   Name: Andre Rojas  DOB: July 04, 1950  MRN: [id number]  Primary Cardiologist: Meriam Sprague, MD  Chart reviewed as part of pre-operative protocol coverage. Because of [name]'s past medical history and time since last visit, he will require a follow-up in-office visit in order to better assess preoperative cardiovascular risk.  Pre-op covering staff: - Please schedule appointment and call patient to inform them. If patient already had an upcoming appointment within acceptable timeframe, please add "pre-op clearance" to the appointment notes so provider is aware. - Please contact requesting surgeon's office via preferred method (i.e, phone, fax) to inform them of need for appointment prior to surgery.    Joylene Grapes, NP  08/26/2022, 12:00 PM

## 2022-08-26 NOTE — Telephone Encounter (Signed)
Lvm for pt to call office to schedule in person appt for pre op clearance. If pt calls please schedule appt.

## 2022-09-15 ENCOUNTER — Telehealth: Payer: Self-pay | Admitting: Cardiology

## 2022-09-15 NOTE — Telephone Encounter (Deleted)
   You are scheduled for Cardiac MRI on ______________. Please arrive for your appointment at ______________ ( arrive 30-45 minutes prior to test start time). ?  [facility] [address] [place], [place] [address] [phone] Please take advantage of the free valet parking available at the [facility] and [employer] (Entrance C).  Proceed to the [facility] Radiology Department (First Floor) for check-in.   OR   [facility] [address] [place], [place] [address] [phone] Please take advantage of the free valet parking available at the MAIN entrance. Proceed to [facility] registration for check-in (first floor).  Magnetic resonance imaging (MRI) is a painless test that produces images of the inside of the body without using Xrays.  During an MRI, strong magnets and radio waves work together in a Data processing manager to form detailed images.   MRI images may provide more details about a medical condition than X-rays, CT scans, and ultrasounds can provide.  You may be given earphones to listen for instructions.  You may eat a light breakfast and take medications as ordered with the exception of furosemide, hydrochlorothiazide, or spironolactone(fluid pill, other). Please avoid stimulants for 12 hr prior to test. (Ie. Caffeine, nicotine, chocolate, or antihistamine medications)  If a contrast material will be used, an IV will be inserted into one of your veins. Contrast material will be injected into your IV. It will leave your body through your urine within a day. You may be told to drink plenty of fluids to help flush the contrast material out of your system.  You will be asked to remove all metal, including: Watch, jewelry, and other metal objects including hearing aids, hair pieces and dentures. Also wearable glucose monitoring systems (ie. Freestyle Libre and Omnipods) (Braces and fillings normally are not a problem.)   TEST WILL TAKE  APPROXIMATELY 1 HOUR  PLEASE NOTIFY SCHEDULING AT LEAST 24 HOURS IN ADVANCE IF YOU ARE UNABLE TO KEEP YOUR APPOINTMENT. [phone]  For more information and frequently asked questions, please visit our website : [email or web address]  Please call Rockwell Alexandria, cardiac imaging nurse navigator with any questions/concerns. Rockwell Alexandria RN Navigator Cardiac Imaging Larey Brick RN Navigator Cardiac Imaging [facility] [phone] Office

## 2022-09-15 NOTE — Telephone Encounter (Signed)
Pt's daughter calling regarding the upcoming procedure. She has a few questions regarding the procedure. Please advise.

## 2022-09-15 NOTE — Telephone Encounter (Signed)
Spoke with patient and his sister, Bonita Quin. She said they were to call if he was on plavix or not (?). She said patient is supposed to have surgery on his gallbladder/gallstones on 7/22. Explained no surgery is scheduled from what I can tell and no cardiac clearance has been given (6/18 clearance request note). Explained that on 7/22 he has a cardiac MRI scheduled (ordered back in April but no instructions on AVS). Explained he has a f/u on 7/29 to review results with NP and discuss surgery/card clearance.   Per 6/18 note, CMA commented that "all parties were updated" which should mean surgery team   cMRI instructions should be called to: [phone] Marina Goodell - sister (patient lives with her)

## 2022-09-26 ENCOUNTER — Telehealth: Payer: Self-pay | Admitting: Cardiology

## 2022-09-26 ENCOUNTER — Telehealth (HOSPITAL_COMMUNITY): Payer: Self-pay | Admitting: *Deleted

## 2022-09-26 NOTE — Telephone Encounter (Signed)
Sister called to report patient has been admitted to [facility] and to cancel patient's MR CARD MORPHOLOGY WO/W CM test scheduled on 7/22.

## 2022-09-26 NOTE — Telephone Encounter (Signed)
Attempted to call patient regarding upcoming cardiac MRI appointment. Left message on voicemail with name and callback number Johney Frame RN Navigator Cardiac Imaging [facility] [phone] Office

## 2022-09-26 NOTE — Telephone Encounter (Signed)
Pt's appointment has been canceled due to being in hospital.

## 2022-09-28 NOTE — Progress Notes (Deleted)
Cardiology Office Note:  .   Date:  09/28/2022  ID:  Andre Rojas, DOB 09-26-1950, MRN [id number] PCP: Storm Frisk, MD  [facility] Providers Cardiologist:  Meriam Sprague, MD { Click to update primary MD,subspecialty MD or APP then REFRESH:1}   Patient Profile: .      PMH CAD S/p CABG 2017 x 3 LIMA-LAD, SVG-PDA, SVG-RI COPD Type 2 DM PVD Schizophrenia CVA Substance abuse  Tobacco abuse Cocaine abuse Hypertension Hyperlipidemia Medical noncompliance  In 2017 he was visiting family in [place] when he developed severe chest pain.  Seen in the local hospital with workup that showed 100% occlusion and 99% occlusion of unknown vessels.  He had [facility] so he was discharged with plans to have surgery in the [place].  He ultimately underwent CABG x 3 with LIMA to LAD, SVG to PDA, and SVG to ramus intermediate in [place].  He has that blushed care with Dr. Shari Prows 04/2020 at which time he was living in a shelter and not taking his cholesterol medicine.  He was on aspirin 325 mg daily for history of stroke.  He continued to smoke.  Echocardiogram was ordered but not completed.  He was admitted 05/2020 with recurrent stroke in the setting of cocaine use. Advised to take asa and Plavix for 3 months, then continue Plavix monotherapy.   Admission 4/1-06/13/22 for acute cholecystitis. Noted to have elevated BP during that admission. Cardiology was consulted and antihypertensives were titrated.  Echocardiogram 06/10/2022 showed severe biventricular hypertrophy with EF 30 to 35%, moderately reduced RV systolic function.  At discharge he reported he was not taking any BP medications prior to admission.   Seen in clinic by Robet Leu, PA on 07/01/22 at which time he reported he was not taking any medications for CHF or hypertension. He was living with his sister and abstaining from cocaine use. Recommendation during admission to undergo cardiac MRI to evaluate for  infiltrative process.  He was advised to resume Plavix 75 mg daily, Lipitor 80 mg daily and start carvedilol 3.125 mg twice daily. He verbalized understanding of risks associated with BB use with cocaine.   Admission 7/18- found to have obstructive jaundice and liver lesions. Drainage tube was placed as he is scheduled for cholecystectomy soon. CMR was cancelled due to pt's hospitalization.        History of Present Illness: .   Andre Rojas is a *** 72 y.o. male who is here today for preoperative cardiac evaluation for upcoming cholecystectomy.   ROS: ***       Studies Reviewed: .        *** Risk Assessment/Calculations:   {Does this patient have ATRIAL FIBRILLATION?:[phone]} No BP recorded.  {Refresh Note OR Click here to enter BP  :1}***       Physical Exam:   VS:  There were no vitals taken for this visit.   Wt Readings from Last 3 Encounters:  07/01/22 138 lb 6.4 oz (62.8 kg)  06/09/22 143 lb (64.9 kg)  08/14/21 155 lb (70.3 kg)    GEN: Well nourished, well developed in no acute distress NECK: No JVD; No carotid bruits CARDIAC: ***RRR, no murmurs, rubs, gallops RESPIRATORY:  Clear to auscultation without rales, wheezing or rhonchi  ABDOMEN: Soft, non-tender, non-distended EXTREMITIES:  No edema; No deformity     ASSESSMENT AND PLAN: .    Preoperative cardiac evaluation: CAD: S/p CABG 2017 Hypertension: Hyperlipidemia:    {Are you ordering a CV  Procedure (e.g. stress test, cath, DCCV, TEE, etc)?   Press F2        :[id number]}  Dispo: ***  Signed, Eligha Bridegroom, NP-C

## 2022-09-29 ENCOUNTER — Ambulatory Visit (HOSPITAL_COMMUNITY): Payer: Medicare HMO

## 2022-10-05 IMAGING — CT CT HEAD CODE STROKE
3 series · 14 of 47 positions shown, 16 images · non-contrast
Comparison: Report from head CT 03/12/2009 (images unavailable).

CLINICAL DATA: Code stroke. Neuro deficit, acute, stroke suspected.
Additional provided: Left-sided weakness, aphasia.

EXAM:
CT HEAD WITHOUT CONTRAST
TECHNIQUE: Contiguous axial images were obtained from the base of the skull
through the vertex without intravenous contrast.

[Series 2: head 5.0 h30s · axial · 0.47mm/px · z∈[-163,-28]mm · 8 of 33 slices shown, 10 images]
[im 3/33  brain]
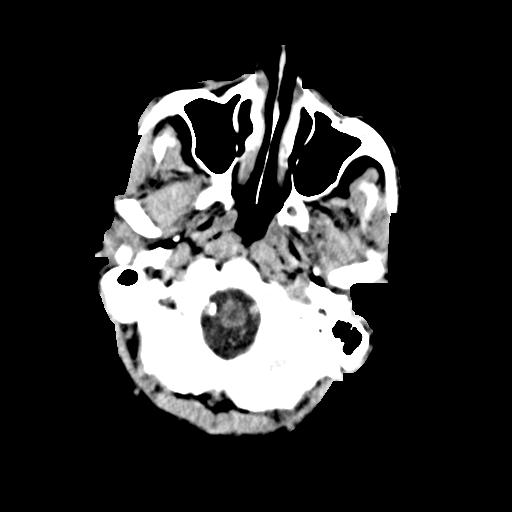
[im 3/33  bone]
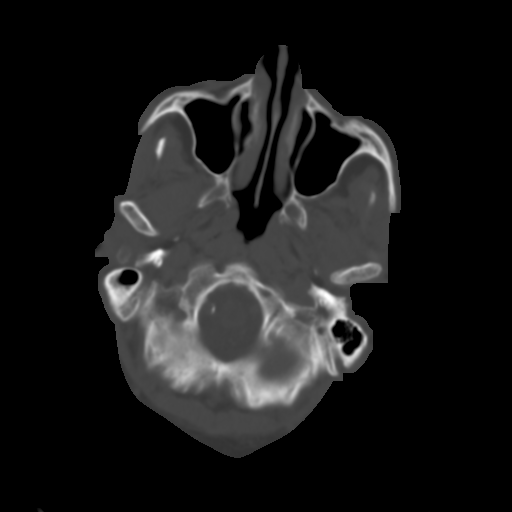
[im 7/33  brain]
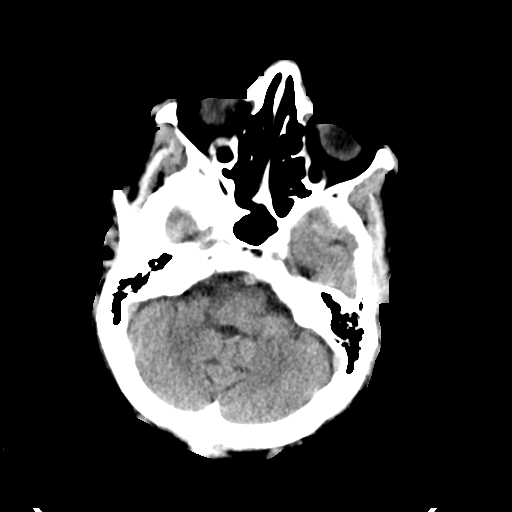
[im 10/33  brain]
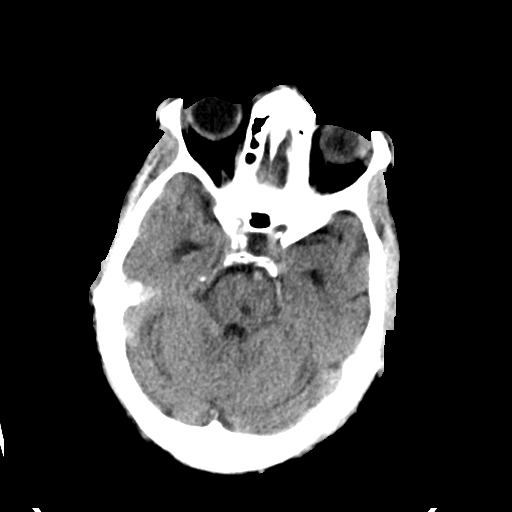
[im 15/33  brain]
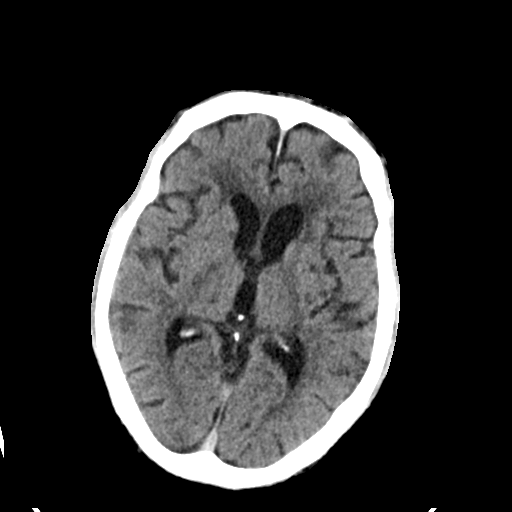
[im 18/33  brain]
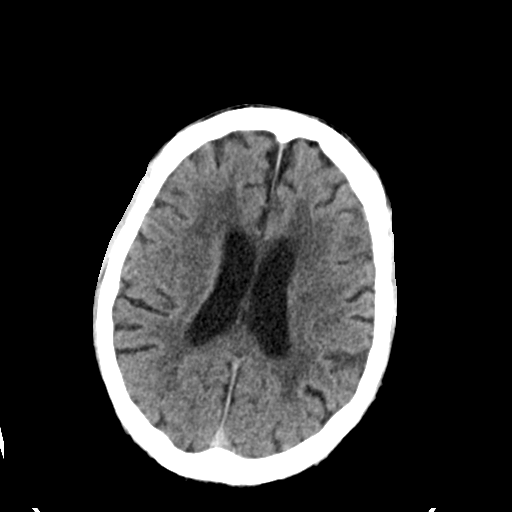
[im 18/33  bone]
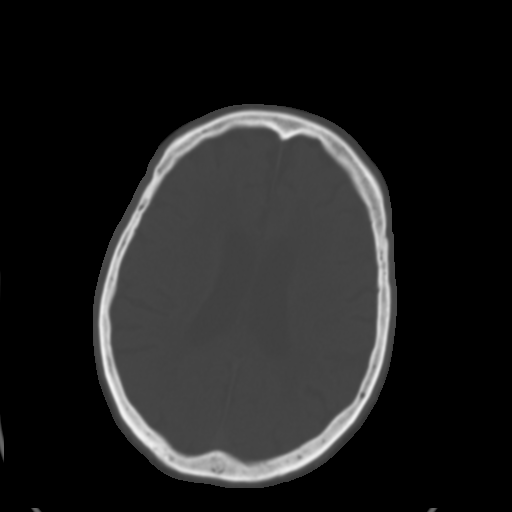
[im 23/33  brain]
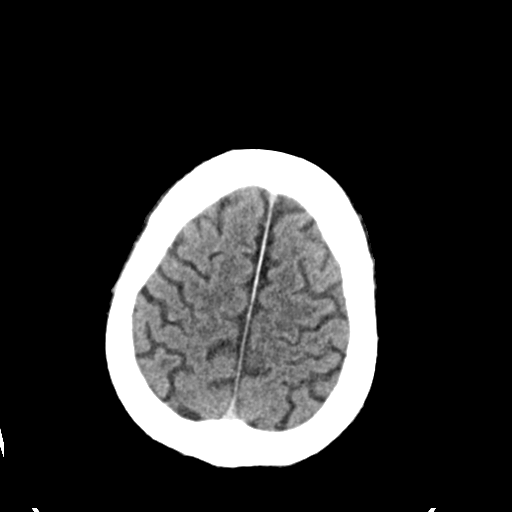
[im 26/33  brain]
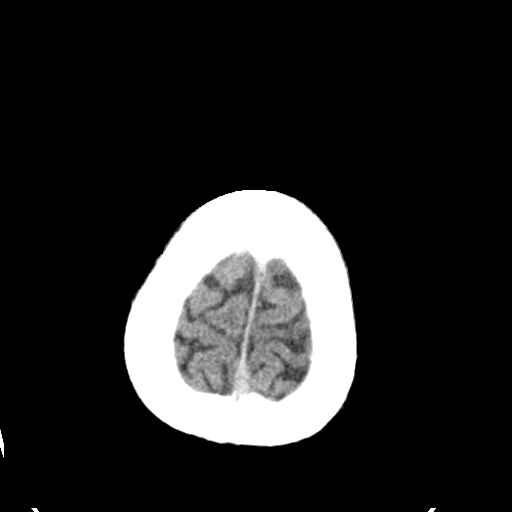
[im 30/33  brain]
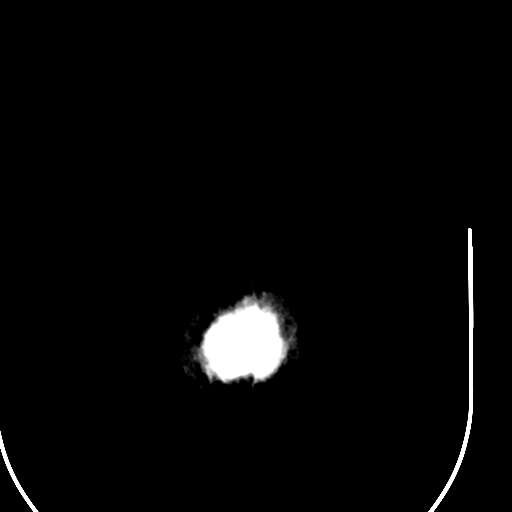

[Series 5: head 3.0 mpr cor · coronal · 0.33mm/px · 3 of 75 slices shown]
[im 25/75  brain]
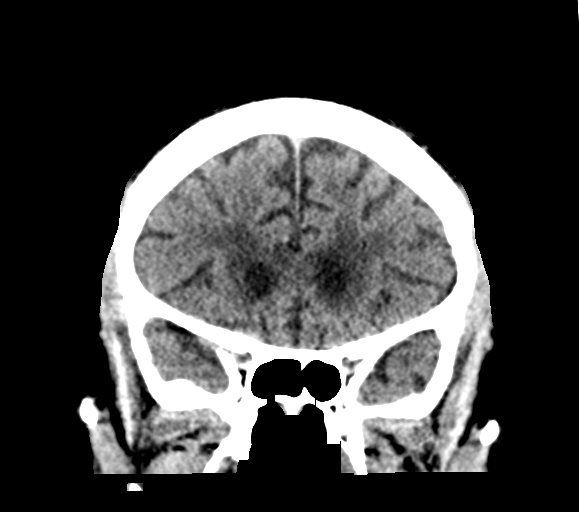
[im 33/75  brain]
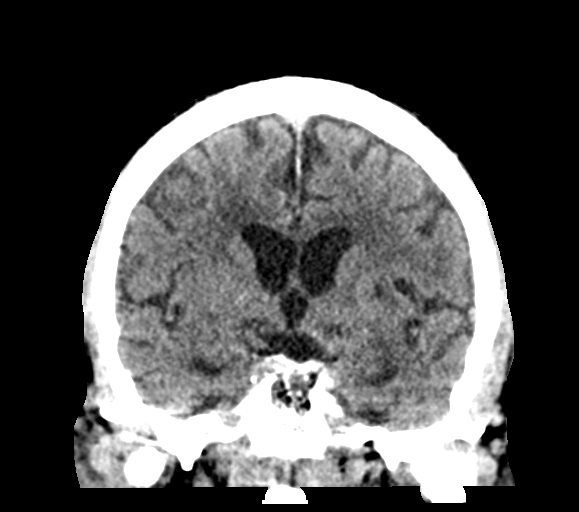
[im 42/75  brain]
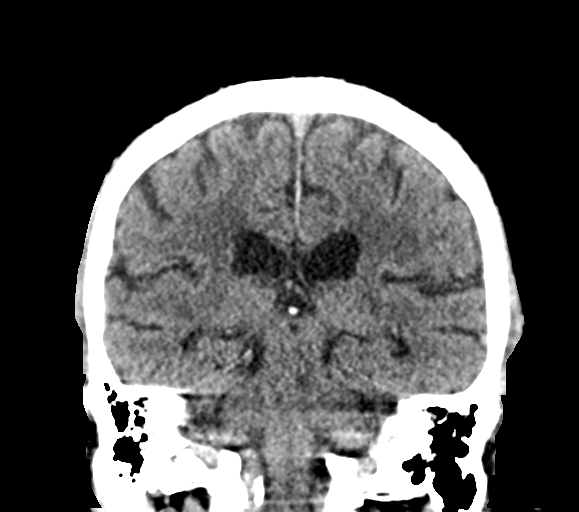

[Series 6: head 3.0 mpr sag · sagittal · 0.32mm/px · 3 of 67 slices shown]
[im 23/67  brain]
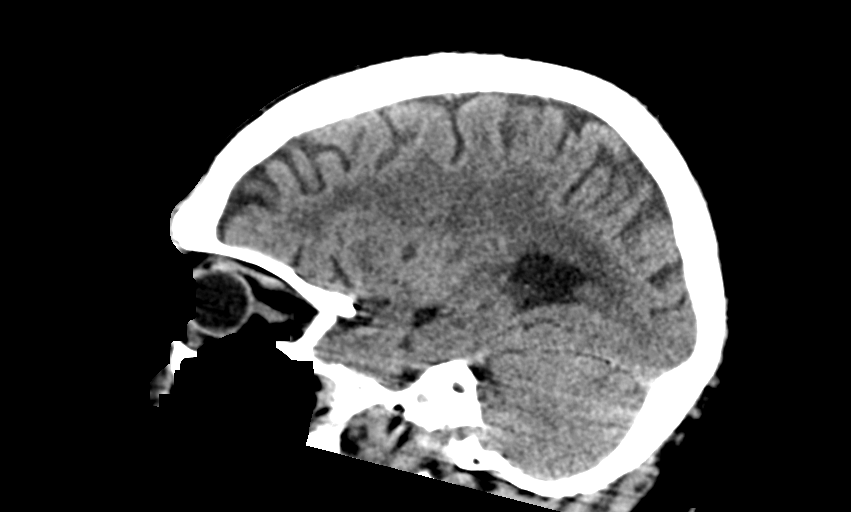
[im 34/67  brain]
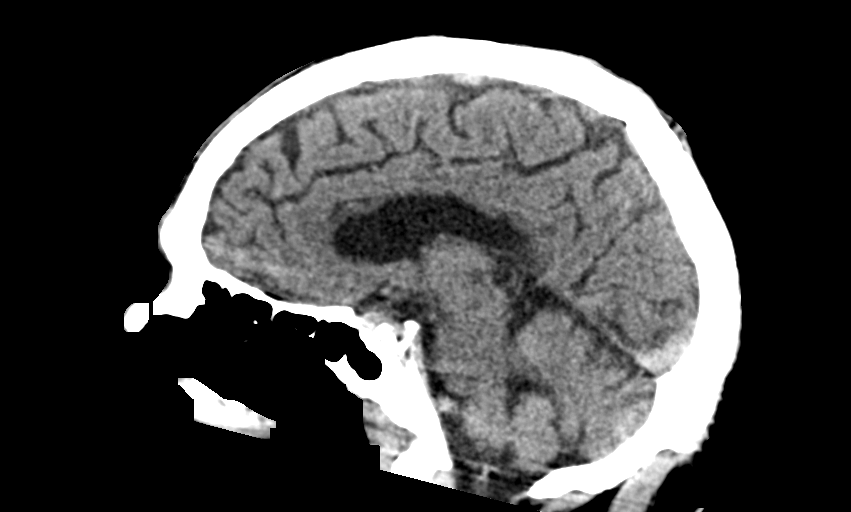
[im 45/67  brain]
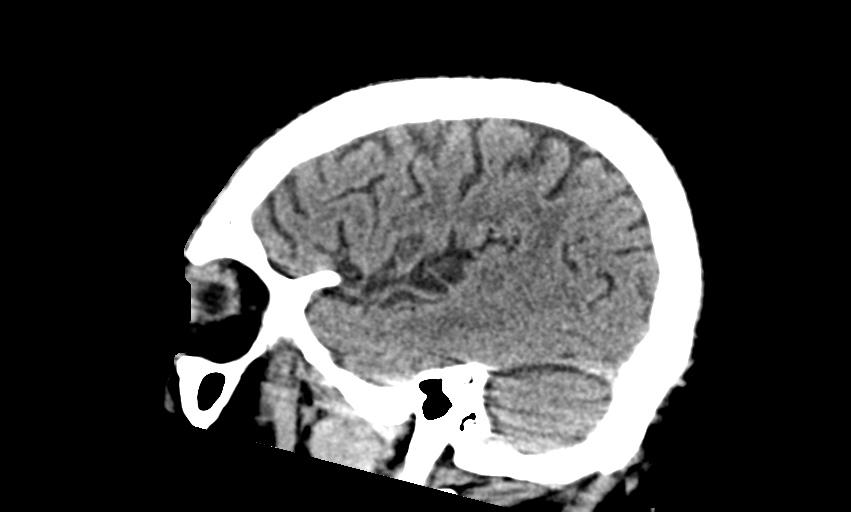

[14 of 47 positions shown; findings below may reference images not displayed]

FINDINGS: Brain:

Mild cerebral and cerebellar atrophy.

Chronic small-vessel infarcts within the bilateral basal ganglia,
deep left frontal white matter and right thalamus. Chronic lacunar
infarct within the left pons (series 2, image 10).

Age-indeterminate lacunar infarcts within the posterior limb of the
right internal capsule (series 2, image 14) and right pons (series
2, image 8).

Background moderate ill-defined hypoattenuation within the cerebral
white matter is nonspecific, but compatible with chronic small
vessel ischemic disease.

There is no acute intracranial hemorrhage.

No demarcated cortical infarct.

No extra-axial fluid collection.

No evidence of intracranial mass.

No midline shift.

Vascular: No hyperdense vessel.  Atherosclerotic calcifications.

Skull: Normal. Negative for fracture or focal lesion.

Sinuses/Orbits: Visualized orbits show no acute finding. Chronic
medially displaced fracture deformity of the left lamina papyracea.
Trace ethmoid sinus mucosal thickening.

ASPECTS (Alberta Stroke Program Early CT Score)

- Ganglionic level infarction (caudate, lentiform nuclei, internal
capsule, insula, M1-M3 cortex): 6 (a point is subtracted for
age-indeterminate lacunar infarct within the posterior limb of right
internal capsule).

- Supraganglionic infarction (M4-M6 cortex): 3

Total score (0-10 with 10 being normal): 9

These results were communicated to Dr. Rz At [DATE] Shenu
05/11/2020by text page via the [employer] messaging system.
IMPRESSION: No acute demarcated cortical infarction or acute intracranial
hemorrhage.

Age-indeterminate lacunar infarcts within the posterior limb of
right internal capsule and right pons. ASPECTS is 9.

Chronic small-vessel infarcts within the bilateral basal ganglia,
deep left frontal white matter and right thalamus.

Background mild generalized parenchymal atrophy and moderate chronic
small vessel ischemic disease.

## 2022-10-06 ENCOUNTER — Encounter: Payer: Self-pay | Admitting: Nurse Practitioner

## 2022-10-06 ENCOUNTER — Ambulatory Visit: Payer: Medicare HMO | Attending: Nurse Practitioner | Admitting: Nurse Practitioner

## 2022-10-09 DEATH — deceased
